# Patient Record
Sex: Male | Born: 1945 | Race: White | Hispanic: No | Smoking: Never smoker
Health system: Southern US, Community
[De-identification: ages and names within clinical notes are randomized; demographics above are authoritative.]

## PROBLEM LIST (undated history)

## (undated) ENCOUNTER — Ambulatory Visit (HOSPITAL_COMMUNITY): Admission: EM | Payer: Medicare Other | Source: Home / Self Care

## (undated) DIAGNOSIS — Z8679 Personal history of other diseases of the circulatory system: Secondary | ICD-10-CM

## (undated) DIAGNOSIS — E785 Hyperlipidemia, unspecified: Secondary | ICD-10-CM

## (undated) DIAGNOSIS — Z87442 Personal history of urinary calculi: Secondary | ICD-10-CM

## (undated) DIAGNOSIS — C801 Malignant (primary) neoplasm, unspecified: Secondary | ICD-10-CM

## (undated) DIAGNOSIS — G4730 Sleep apnea, unspecified: Secondary | ICD-10-CM

## (undated) DIAGNOSIS — G629 Polyneuropathy, unspecified: Secondary | ICD-10-CM

## (undated) DIAGNOSIS — K56609 Unspecified intestinal obstruction, unspecified as to partial versus complete obstruction: Principal | ICD-10-CM

## (undated) HISTORY — PX: EYE SURGERY: SHX253

## (undated) HISTORY — PX: NASAL FRACTURE SURGERY: SHX718

---

## 2001-11-04 ENCOUNTER — Emergency Department (HOSPITAL_COMMUNITY): Admission: EM | Admit: 2001-11-04 | Discharge: 2001-11-04 | Payer: Self-pay | Admitting: Emergency Medicine

## 2004-09-20 ENCOUNTER — Ambulatory Visit: Payer: Self-pay | Admitting: Oncology

## 2004-09-24 ENCOUNTER — Ambulatory Visit: Payer: Self-pay | Admitting: Oncology

## 2004-12-28 ENCOUNTER — Ambulatory Visit: Payer: Self-pay | Admitting: Oncology

## 2005-01-05 ENCOUNTER — Ambulatory Visit: Payer: Self-pay | Admitting: Oncology

## 2005-04-22 ENCOUNTER — Ambulatory Visit: Payer: Self-pay | Admitting: Oncology

## 2005-04-27 ENCOUNTER — Ambulatory Visit: Payer: Self-pay | Admitting: Oncology

## 2005-05-05 ENCOUNTER — Ambulatory Visit: Payer: Self-pay | Admitting: Oncology

## 2005-05-26 ENCOUNTER — Encounter: Payer: Self-pay | Admitting: Oncology

## 2005-06-05 ENCOUNTER — Encounter: Payer: Self-pay | Admitting: Oncology

## 2005-10-21 ENCOUNTER — Ambulatory Visit: Payer: Self-pay | Admitting: Oncology

## 2006-04-06 ENCOUNTER — Ambulatory Visit: Payer: Self-pay | Admitting: Oncology

## 2006-04-07 ENCOUNTER — Ambulatory Visit: Payer: Self-pay | Admitting: Oncology

## 2006-04-28 ENCOUNTER — Ambulatory Visit: Payer: Self-pay | Admitting: Internal Medicine

## 2008-10-05 ENCOUNTER — Ambulatory Visit: Payer: Self-pay | Admitting: Oncology

## 2008-10-16 ENCOUNTER — Ambulatory Visit: Payer: Self-pay | Admitting: Oncology

## 2008-11-05 ENCOUNTER — Ambulatory Visit: Payer: Self-pay | Admitting: Oncology

## 2018-03-07 DIAGNOSIS — N189 Chronic kidney disease, unspecified: Secondary | ICD-10-CM

## 2018-03-07 HISTORY — DX: Chronic kidney disease, unspecified: N18.9

## 2018-06-06 DIAGNOSIS — K56609 Unspecified intestinal obstruction, unspecified as to partial versus complete obstruction: Secondary | ICD-10-CM

## 2018-06-06 HISTORY — PX: SMALL BOWEL EMBOLIZATION: SHX2414

## 2018-06-06 HISTORY — DX: Unspecified intestinal obstruction, unspecified as to partial versus complete obstruction: K56.609

## 2018-06-29 ENCOUNTER — Emergency Department (HOSPITAL_COMMUNITY): Payer: Medicare Other

## 2018-06-29 ENCOUNTER — Encounter (HOSPITAL_COMMUNITY): Payer: Self-pay | Admitting: Student

## 2018-06-29 ENCOUNTER — Inpatient Hospital Stay (HOSPITAL_COMMUNITY): Payer: Medicare Other

## 2018-06-29 ENCOUNTER — Other Ambulatory Visit: Payer: Self-pay

## 2018-06-29 ENCOUNTER — Inpatient Hospital Stay (HOSPITAL_COMMUNITY)
Admission: EM | Admit: 2018-06-29 | Discharge: 2018-07-13 | DRG: 821 | Disposition: A | Discharge status: Skilled Nursing Facility | Payer: Medicare Other | Attending: Internal Medicine | Admitting: Internal Medicine

## 2018-06-29 DIAGNOSIS — E878 Other disorders of electrolyte and fluid balance, not elsewhere classified: Secondary | ICD-10-CM | POA: Diagnosis present

## 2018-06-29 DIAGNOSIS — Z0189 Encounter for other specified special examinations: Secondary | ICD-10-CM

## 2018-06-29 DIAGNOSIS — Z8679 Personal history of other diseases of the circulatory system: Secondary | ICD-10-CM | POA: Diagnosis not present

## 2018-06-29 DIAGNOSIS — Z7902 Long term (current) use of antithrombotics/antiplatelets: Secondary | ICD-10-CM

## 2018-06-29 DIAGNOSIS — C8333 Diffuse large B-cell lymphoma, intra-abdominal lymph nodes: Principal | ICD-10-CM | POA: Diagnosis present

## 2018-06-29 DIAGNOSIS — Z807 Family history of other malignant neoplasms of lymphoid, hematopoietic and related tissues: Secondary | ICD-10-CM

## 2018-06-29 DIAGNOSIS — N179 Acute kidney failure, unspecified: Secondary | ICD-10-CM | POA: Diagnosis present

## 2018-06-29 DIAGNOSIS — I10 Essential (primary) hypertension: Secondary | ICD-10-CM | POA: Diagnosis not present

## 2018-06-29 DIAGNOSIS — Z923 Personal history of irradiation: Secondary | ICD-10-CM

## 2018-06-29 DIAGNOSIS — L989 Disorder of the skin and subcutaneous tissue, unspecified: Secondary | ICD-10-CM | POA: Diagnosis not present

## 2018-06-29 DIAGNOSIS — T402X5A Adverse effect of other opioids, initial encounter: Secondary | ICD-10-CM | POA: Diagnosis not present

## 2018-06-29 DIAGNOSIS — E871 Hypo-osmolality and hyponatremia: Secondary | ICD-10-CM | POA: Diagnosis present

## 2018-06-29 DIAGNOSIS — R112 Nausea with vomiting, unspecified: Secondary | ICD-10-CM | POA: Diagnosis present

## 2018-06-29 DIAGNOSIS — E785 Hyperlipidemia, unspecified: Secondary | ICD-10-CM | POA: Diagnosis present

## 2018-06-29 DIAGNOSIS — I447 Left bundle-branch block, unspecified: Secondary | ICD-10-CM | POA: Diagnosis present

## 2018-06-29 DIAGNOSIS — I952 Hypotension due to drugs: Secondary | ICD-10-CM | POA: Diagnosis not present

## 2018-06-29 DIAGNOSIS — R197 Diarrhea, unspecified: Secondary | ICD-10-CM | POA: Diagnosis not present

## 2018-06-29 DIAGNOSIS — Z79899 Other long term (current) drug therapy: Secondary | ICD-10-CM | POA: Diagnosis not present

## 2018-06-29 DIAGNOSIS — N189 Chronic kidney disease, unspecified: Secondary | ICD-10-CM | POA: Diagnosis present

## 2018-06-29 DIAGNOSIS — E781 Pure hyperglyceridemia: Secondary | ICD-10-CM | POA: Diagnosis not present

## 2018-06-29 DIAGNOSIS — D62 Acute posthemorrhagic anemia: Secondary | ICD-10-CM | POA: Diagnosis not present

## 2018-06-29 DIAGNOSIS — I129 Hypertensive chronic kidney disease with stage 1 through stage 4 chronic kidney disease, or unspecified chronic kidney disease: Secondary | ICD-10-CM | POA: Diagnosis present

## 2018-06-29 DIAGNOSIS — Z9221 Personal history of antineoplastic chemotherapy: Secondary | ICD-10-CM

## 2018-06-29 DIAGNOSIS — E861 Hypovolemia: Secondary | ICD-10-CM | POA: Diagnosis present

## 2018-06-29 DIAGNOSIS — Y92230 Patient room in hospital as the place of occurrence of the external cause: Secondary | ICD-10-CM | POA: Diagnosis not present

## 2018-06-29 DIAGNOSIS — E876 Hypokalemia: Secondary | ICD-10-CM | POA: Diagnosis present

## 2018-06-29 DIAGNOSIS — K668 Other specified disorders of peritoneum: Secondary | ICD-10-CM | POA: Diagnosis present

## 2018-06-29 DIAGNOSIS — K56609 Unspecified intestinal obstruction, unspecified as to partial versus complete obstruction: Secondary | ICD-10-CM | POA: Diagnosis present

## 2018-06-29 DIAGNOSIS — K567 Ileus, unspecified: Secondary | ICD-10-CM | POA: Diagnosis not present

## 2018-06-29 DIAGNOSIS — R1900 Intra-abdominal and pelvic swelling, mass and lump, unspecified site: Secondary | ICD-10-CM | POA: Diagnosis not present

## 2018-06-29 DIAGNOSIS — C8598 Non-Hodgkin lymphoma, unspecified, lymph nodes of multiple sites: Secondary | ICD-10-CM | POA: Diagnosis not present

## 2018-06-29 DIAGNOSIS — R740 Nonspecific elevation of levels of transaminase and lactic acid dehydrogenase [LDH]: Secondary | ICD-10-CM | POA: Diagnosis not present

## 2018-06-29 DIAGNOSIS — L98411 Non-pressure chronic ulcer of buttock limited to breakdown of skin: Secondary | ICD-10-CM | POA: Diagnosis not present

## 2018-06-29 DIAGNOSIS — K660 Peritoneal adhesions (postprocedural) (postinfection): Secondary | ICD-10-CM | POA: Diagnosis present

## 2018-06-29 DIAGNOSIS — D649 Anemia, unspecified: Secondary | ICD-10-CM | POA: Diagnosis not present

## 2018-06-29 DIAGNOSIS — R5381 Other malaise: Secondary | ICD-10-CM | POA: Diagnosis not present

## 2018-06-29 DIAGNOSIS — R1907 Generalized intra-abdominal and pelvic swelling, mass and lump: Secondary | ICD-10-CM | POA: Diagnosis not present

## 2018-06-29 DIAGNOSIS — C8330 Diffuse large B-cell lymphoma, unspecified site: Secondary | ICD-10-CM | POA: Diagnosis not present

## 2018-06-29 DIAGNOSIS — L8990 Pressure ulcer of unspecified site, unspecified stage: Secondary | ICD-10-CM

## 2018-06-29 HISTORY — DX: Unspecified intestinal obstruction, unspecified as to partial versus complete obstruction: K56.609

## 2018-06-29 HISTORY — DX: Malignant (primary) neoplasm, unspecified: C80.1

## 2018-06-29 HISTORY — DX: Personal history of other diseases of the circulatory system: Z86.79

## 2018-06-29 LAB — COMPREHENSIVE METABOLIC PANEL
ALT: 83 U/L — ABNORMAL HIGH (ref 0–44)
AST: 51 U/L — ABNORMAL HIGH (ref 15–41)
Albumin: 3.3 g/dL — ABNORMAL LOW (ref 3.5–5.0)
Alkaline Phosphatase: 72 U/L (ref 38–126)
Anion gap: 17 — ABNORMAL HIGH (ref 5–15)
BUN: 31 mg/dL — ABNORMAL HIGH (ref 8–23)
CO2: 22 mmol/L (ref 22–32)
Calcium: 9.4 mg/dL (ref 8.9–10.3)
Chloride: 91 mmol/L — ABNORMAL LOW (ref 98–111)
Creatinine, Ser: 1.78 mg/dL — ABNORMAL HIGH (ref 0.61–1.24)
GFR calc Af Amer: 44 mL/min — ABNORMAL LOW (ref >60)
GFR calc non Af Amer: 38 mL/min — ABNORMAL LOW (ref >60)
Glucose, Bld: 112 mg/dL — ABNORMAL HIGH (ref 70–99)
Potassium: 3.2 mmol/L — ABNORMAL LOW (ref 3.5–5.1)
Sodium: 130 mmol/L — ABNORMAL LOW (ref 135–145)
Total Bilirubin: 0.8 mg/dL (ref 0.3–1.2)
Total Protein: 6.1 g/dL — ABNORMAL LOW (ref 6.5–8.1)

## 2018-06-29 LAB — CBC WITH DIFFERENTIAL/PLATELET
Band Neutrophils: 0 %
Basophils Absolute: 0 10*3/uL (ref 0.0–0.1)
Basophils Relative: 0 %
Blasts: 0 %
Eosinophils Absolute: 0.1 10*3/uL (ref 0.0–0.5)
Eosinophils Relative: 1 %
HCT: 45.6 % (ref 39.0–52.0)
Hemoglobin: 15.5 g/dL (ref 13.0–17.0)
Lymphocytes Relative: 9 %
Lymphs Abs: 1.1 10*3/uL (ref 0.7–4.0)
MCH: 28.6 pg (ref 26.0–34.0)
MCHC: 34 g/dL (ref 30.0–36.0)
MCV: 84.1 fL (ref 80.0–100.0)
Metamyelocytes Relative: 0 %
Monocytes Absolute: 1.3 10*3/uL — ABNORMAL HIGH (ref 0.1–1.0)
Monocytes Relative: 11 %
Myelocytes: 0 %
Neutro Abs: 9.5 10*3/uL — ABNORMAL HIGH (ref 1.7–7.7)
Neutrophils Relative %: 79 %
Other: 0 %
Platelets: 231 10*3/uL (ref 150–400)
Promyelocytes Relative: 0 %
RBC: 5.42 MIL/uL (ref 4.22–5.81)
RDW: 13.1 % (ref 11.5–15.5)
WBC: 12 10*3/uL — ABNORMAL HIGH (ref 4.0–10.5)
nRBC: 0 % (ref 0.0–0.2)
nRBC: 0 /100 WBC

## 2018-06-29 LAB — URINALYSIS, ROUTINE W REFLEX MICROSCOPIC
Bilirubin Urine: NEGATIVE
Glucose, UA: NEGATIVE mg/dL
Hgb urine dipstick: NEGATIVE
Ketones, ur: 20 mg/dL — AB
Leukocytes,Ua: NEGATIVE
Nitrite: NEGATIVE
Protein, ur: NEGATIVE mg/dL
Specific Gravity, Urine: >1.046 — ABNORMAL HIGH (ref 1.005–1.030)
pH: 5 (ref 5.0–8.0)

## 2018-06-29 LAB — MAGNESIUM: Magnesium: 1.8 mg/dL (ref 1.7–2.4)

## 2018-06-29 LAB — LIPASE, BLOOD: Lipase: 54 U/L — ABNORMAL HIGH (ref 11–51)

## 2018-06-29 MED ORDER — HEPARIN SODIUM (PORCINE) 5000 UNIT/ML IJ SOLN
5000.0000 [IU] | Freq: Three times a day (TID) | INTRAMUSCULAR | Status: DC
  Administered 2018-06-29 – 2018-07-03 (×10): 5000 [IU] via SUBCUTANEOUS
  Filled 2018-06-29 (×12): qty 1

## 2018-06-29 MED ORDER — ACETAMINOPHEN 325 MG PO TABS: 650.0000 mg | ORAL_TABLET | Freq: Four times a day (QID) | ORAL | Status: DC | PRN

## 2018-06-29 MED ORDER — SODIUM CHLORIDE 0.9 % IV SOLN
INTRAVENOUS | Status: DC
  Administered 2018-06-29 – 2018-07-02 (×9): via INTRAVENOUS

## 2018-06-29 MED ORDER — SODIUM CHLORIDE 0.9 % IV SOLN: INTRAVENOUS | Status: DC

## 2018-06-29 MED ORDER — ACETAMINOPHEN 650 MG RE SUPP: 650.0000 mg | Freq: Four times a day (QID) | RECTAL | Status: DC | PRN

## 2018-06-29 MED ORDER — IOPAMIDOL (ISOVUE-300) INJECTION 61%
80.0000 mL | Freq: Once | INTRAVENOUS | Status: AC | PRN
  Administered 2018-06-29: 15:00:00 80 mL via INTRAVENOUS

## 2018-06-29 MED ORDER — ONDANSETRON HCL 4 MG/2ML IJ SOLN
4.0000 mg | Freq: Once | INTRAMUSCULAR | Status: AC
  Administered 2018-06-29: 4 mg via INTRAVENOUS
  Filled 2018-06-29: qty 2

## 2018-06-29 MED ORDER — SODIUM CHLORIDE 0.9 % IV BOLUS
1000.0000 mL | Freq: Once | INTRAVENOUS | Status: AC
  Administered 2018-06-29: 13:00:00 1000 mL via INTRAVENOUS

## 2018-06-29 MED ORDER — ONDANSETRON HCL 4 MG/2ML IJ SOLN
4.0000 mg | Freq: Once | INTRAMUSCULAR | Status: AC
  Administered 2018-06-29: 22:00:00 4 mg via INTRAVENOUS
  Filled 2018-06-29: qty 2

## 2018-06-29 MED ORDER — SODIUM CHLORIDE 0.9% FLUSH
3.0000 mL | Freq: Two times a day (BID) | INTRAVENOUS | Status: DC
  Administered 2018-07-01 – 2018-07-13 (×9): 3 mL via INTRAVENOUS

## 2018-06-29 MED ORDER — POTASSIUM CHLORIDE 10 MEQ/100ML IV SOLN
10.0000 meq | INTRAVENOUS | Status: AC
  Administered 2018-06-29 (×2): 10 meq via INTRAVENOUS
  Filled 2018-06-29 (×2): qty 100

## 2018-06-29 MED ORDER — DIATRIZOATE MEGLUMINE & SODIUM 66-10 % PO SOLN
90.0000 mL | Freq: Once | ORAL | Status: AC
  Administered 2018-06-29: 90 mL via NASOGASTRIC
  Filled 2018-06-29: qty 90

## 2018-06-29 MED ORDER — SODIUM CHLORIDE 0.9 % IV BOLUS
500.0000 mL | Freq: Once | INTRAVENOUS | Status: AC
  Administered 2018-06-29: 500 mL via INTRAVENOUS

## 2018-06-29 MED ORDER — SODIUM CHLORIDE 0.9% FLUSH: 3.0000 mL | INTRAVENOUS | Status: DC | PRN

## 2018-06-29 NOTE — ED Notes (Addendum)
ED TO INPATIENT HANDOFF REPORT  ED Nurse Name and Phone #: Kathlee Nations [phone]  S Name/Age/Gender Jack Fuentes 73 y.o. male Room/Bed: 021C/021C  Code Status   Code Status: Full Code  Home/SNF/Other Home Patient oriented to: self, place, time and situation Is this baseline? 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 Not currently being treated for the cancer. Pt reports he has not had a bowel movement in about a week. Endorces passing small amounts of gas, nausea and vomiting. Pt denies pain.    Allergies Not on File  Level of Care/Admitting Diagnosis ED Disposition    ED Disposition Condition [facility] Area: [place] [[id number]]  Level of Care: Med-Surg [16]  Covid Evaluation: N/A  Diagnosis: SBO (small bowel obstruction) [facility]) [[id number]]  Admitting Physician: Aline Brochure  Attending Physician: [name] [phone]  Estimated length of stay: past midnight tomorrow  Certification:: I certify this patient will need inpatient services for at least 2 midnights  PT Class (Do Not Modify): Inpatient [101]  PT Acc Code (Do Not Modify): Private [1]       B Medical/Surgery History Past Medical History:  Diagnosis Date  . Cancer [facility])    History reviewed. No pertinent surgical history.   A IV Location/Drains/Wounds Patient Lines/Drains/Airways Status   Active Line/Drains/Airways    Name:   Placement date:   Placement time:   Site:   Days:   Peripheral IV 06/29/18 Left Wrist   06/29/18    1225    Wrist   less than 1   NG/OG Tube Nasogastric 14 Fr. Right nare Aucultation   06/29/18    1557    Right nare   less than 1          Intake/Output Last 24 hours  Intake/Output Summary (Last 24 hours) at 06/29/2018 1642 Last data filed at 06/29/2018 1620 Gross per 24 hour  Intake 1000 ml  Output 800 ml  Net 200 ml     Labs/Imaging Results for orders placed or performed during the hospital encounter of 06/29/18 (from the past 48 hour(s))  CBC with Differential     Status: Abnormal   Collection Time: 06/29/18 12:21 PM  Result Value Ref Range   WBC 12.0 (H) 4.0 - 10.5 K/uL   RBC 5.42 4.22 - 5.81 MIL/uL   Hemoglobin 15.5 13.0 - 17.0 g/dL   HCT 45.6 39.0 - 52.0 %   MCV 84.1 80.0 - 100.0 fL   MCH 28.6 26.0 - 34.0 pg   MCHC 34.0 30.0 - 36.0 g/dL   RDW 13.1 11.5 - 15.5 %   Platelets 231 150 - 400 K/uL   nRBC 0.0 0.0 - 0.2 %   Neutrophils Relative % 79 %   Lymphocytes Relative 9 %   Monocytes Relative 11 %   Eosinophils Relative 1 %   Basophils Relative 0 %   Band Neutrophils 0 %   Metamyelocytes Relative 0 %   Myelocytes 0 %   Promyelocytes Relative 0 %   Blasts 0 %   nRBC 0 0 /100 WBC   Other 0 %   Neutro Abs 9.5 (H) 1.7 - 7.7 K/uL   Lymphs Abs 1.1 0.7 - 4.0 K/uL   Monocytes Absolute 1.3 (H) 0.1 - 1.0 K/uL   Eosinophils Absolute 0.1 0.0 - 0.5 K/uL   Basophils Absolute 0.0 0.0 -  0.1 K/uL    Comment: Performed at [facility], [place] [address] [place], [place] [address]  Comprehensive metabolic panel     Status: Abnormal   Collection Time: 06/29/18 12:21 PM  Result Value Ref Range   Sodium 130 (L) 135 - 145 mmol/L   Potassium 3.2 (L) 3.5 - 5.1 mmol/L   Chloride 91 (L) 98 - 111 mmol/L   CO2 22 22 - 32 mmol/L   Glucose, Bld 112 (H) 70 - 99 mg/dL   BUN 31 (H) 8 - 23 mg/dL   Creatinine, Ser 1.78 (H) 0.61 - 1.24 mg/dL   Calcium 9.4 8.9 - 10.3 mg/dL   Total Protein 6.1 (L) 6.5 - 8.1 g/dL   Albumin 3.3 (L) 3.5 - 5.0 g/dL   AST 51 (H) 15 - 41 U/L   ALT 83 (H) 0 - 44 U/L   Alkaline Phosphatase 72 38 - 126 U/L   Total Bilirubin 0.8 0.3 - 1.2 mg/dL   GFR calc non Af Amer 38 (L) >60 mL/min   GFR calc Af Amer 44 (L) >60 mL/min   Anion gap 17 (H) 5 - 15    Comment: Performed at [facility], [place] [address] [place], [place] [address]  Lipase, blood     Status: Abnormal   Collection  Time: 06/29/18 12:21 PM  Result Value Ref Range   Lipase 54 (H) 11 - 51 U/L    Comment: Performed at [place] [address] [place], [place] [address]   Ct Abdomen Pelvis W Contrast  Result Date: 06/29/2018 CLINICAL DATA:  73 year old with nausea and vomiting. History of lymphoma. EXAM: CT ABDOMEN AND PELVIS WITH CONTRAST TECHNIQUE: Multidetector CT imaging of the abdomen and pelvis was performed using the standard protocol following bolus administration of intravenous contrast. CONTRAST:  18mL ISOVUE-300 IOPAMIDOL (ISOVUE-300) INJECTION 61% COMPARISON:  PET-CT 04/28/2006 FINDINGS: Lower chest: Lung bases are clear. Hepatobiliary: Normal appearance of the liver, gallbladder and portal venous system. No biliary dilatation. Pancreas: Unremarkable. No pancreatic ductal dilatation or surrounding inflammatory changes. Spleen: Normal in size without focal abnormality. Adrenals/Urinary Tract: Normal adrenal glands. Chronic atrophy of the left kidney. Chronic dilatation of the left renal pelvis and left renal calices. Small hypodensities in right kidney are too small to definitively characterize. Negative for right hydronephrosis. Urinary bladder is unremarkable. Stomach/Bowel: Stomach and duodenum are distended. Duodenum measures up to 5.5 cm in diameter. Markedly dilated proximal jejunum measuring up to 6.5 cm on sequence 3, image 64. Small bowel loops in the central abdomen have mild wall thickening with mucosal enhancement. The distal small bowel and terminal ileum are decompressed. Multiple small bowel loops are encircling large clusters of central mesenteric calcifications. Findings are compatible with a high-grade small bowel obstruction. The colon is decompressed. There is a retrocecal appendix without inflammation. Vascular/Lymphatic: Atherosclerotic calcifications in the aorta and iliac arteries without aneurysm. Soft tissue thickening along the left side of the aorta at the level of the  atrophic kidney is probably associated with the atrophic kidney and similar to the prior examination. Increased left periaortic calcifications. Again noted are extensive central mesenteric calcifications associated with small mesenteric lymph nodes. There was a similar finding on the study from 2008 although the calcifications may have slightly progressed. Mesenteric calcifications are poorly defined but roughly measures 4.9 x 4.1 cm on sequence 3, image 43. Reproductive: Prostate enlargement measuring 6.9 cm in the short axis. Other: Negative for free fluid.  Negative for free air.  Musculoskeletal: No acute bone abnormality. IMPRESSION: 1. High-grade small bowel obstruction. Massive distension of the stomach, duodenum and proximal small bowel. Transition point is associated with the large mesenteric calcifications in the mid abdomen. Distal small bowel is decompressed. Mild small bowel wall thickening centered around the mesenteric calcifications. 2. Massive central mesenteric calcifications that have minimally changed since 2008. Based on the history of lymphoma, findings are most compatible with treated disease. No evidence for lymphoma recurrence. 3. Chronic atrophy of the left kidney with dilatation of the left renal collecting system, no significant change since 2008. 4.  Aortic Atherosclerosis (ICD10-I70.0). 5. Prostate enlargement. Electronically Signed   By: Markus Daft M.D.   On: 06/29/2018 15:24    Pending Labs Unresulted Labs (From admission, onward)    Start     Ordered   06/30/18 0500  CBC  Tomorrow morning,   R     06/29/18 1632   06/30/18 0500  Comprehensive metabolic panel  Tomorrow morning,   R     06/29/18 1632   06/29/18 1630  Magnesium  Add-on,   R     06/29/18 1632   06/29/18 1628  CBC  (heparin)  Once,   R    Comments:  Baseline for heparin therapy IF NOT ALREADY DRAWN.  Notify MD if PLT < 100 K.    06/29/18 1632   06/29/18 1628  Creatinine, serum  (heparin)  Once,   R     Comments:  Baseline for heparin therapy IF NOT ALREADY DRAWN.    06/29/18 1632   06/29/18 1627  HIV antibody (Routine Testing)  Once,   R     06/29/18 1632   06/29/18 1207  Urinalysis, Routine w reflex microscopic  ONCE - STAT,   STAT     06/29/18 1214          Vitals/Pain Today's Vitals   06/29/18 1530 06/29/18 1541 06/29/18 1600 06/29/18 1630  BP: 111/83 111/83 105/82 101/81  Pulse: (!) 102 (!) 101  (!) 111  Resp: 14 18 15 16   Temp:      TempSrc:      SpO2: 98% 98% 100% 100%  Weight:      Height:      PainSc:        Isolation Precautions No active isolations  Medications Medications  heparin injection 5,000 Units (has no administration in time range)  potassium chloride 10 mEq in 100 mL IVPB (has no administration in time range)  sodium chloride flush (NS) 0.9 % injection 3 mL (has no administration in time range)  sodium chloride flush (NS) 0.9 % injection 3 mL (has no administration in time range)  acetaminophen (TYLENOL) tablet 650 mg (has no administration in time range)    Or  acetaminophen (TYLENOL) suppository 650 mg (has no administration in time range)  sodium chloride 0.9 % bolus 500 mL (has no administration in time range)    Followed by  0.9 %  sodium chloride infusion (has no administration in time range)  sodium chloride 0.9 % bolus 1,000 mL (0 mLs Intravenous Stopped 06/29/18 1341)  iopamidol (ISOVUE-300) 61 % injection 80 mL (80 mLs Intravenous Contrast Given 06/29/18 1446)  ondansetron (ZOFRAN) injection 4 mg (4 mg Intravenous Given 06/29/18 1603)    Mobility walks Low fall risk   Focused Assessments GI   R Recommendations: See Admitting Provider Note  Report given to: [place] RN  Additional Notes: NG tube

## 2018-06-29 NOTE — ED Notes (Signed)
Nurse Navigator communication with patient, he declines family contact. Reports he does not have anyone. RN at bedside currently.

## 2018-06-29 NOTE — Discharge Summary (Signed)
Name: Jack Fuentes MRN: [id number] DOB: 1945/09/04 73 y.o. PCP: Mickel Duhamel  Date of Admission: 06/29/2018 11:33 AM Date of Discharge:  07/13/18 Attending Physician: [name], MD  Discharge Diagnosis: 1. Small bowel obstruction s/p ex lap, lysis adhesion, small bowel resection with resection of calcified mesenteric mass  2. Diffuse large B cell lymphoma  3. Hypertension  4. Acute Kidney Injury  Discharge Medications: Allergies as of 07/13/2018   No Known Allergies     Medication List    STOP taking these medications   valsartan 80 MG tablet Commonly known as:  DIOVAN     TAKE these medications   acetaminophen 325 MG tablet Commonly known as:  TYLENOL Take 2 tablets (650 mg total) by mouth every 6 (six) hours.   clopidogrel 75 MG tablet Commonly known as:  PLAVIX Take 75 mg by mouth daily.   feeding supplement (ENSURE ENLIVE) Liqd Take 237 mLs by mouth 2 (two) times daily between meals.   gabapentin 600 MG tablet Commonly known as:  NEURONTIN Take 600 mg by mouth 2 (two) times daily.   methocarbamol 500 MG tablet Commonly known as:  ROBAXIN Take 1 tablet (500 mg total) by mouth every 8 (eight) hours as needed for muscle spasms.   multivitamin with minerals Tabs tablet Take 1 tablet by mouth daily. Start taking on:  Jul 14, 2018   psyllium 95 % Pack Commonly known as:  HYDROCIL/METAMUCIL Take 1 packet by mouth daily. Start taking on:  Jul 14, 2018   rosuvastatin 10 MG tablet Commonly known as:  CRESTOR Take 10 mg by mouth daily.   saccharomyces boulardii 250 MG capsule Commonly known as:  FLORASTOR Take 1 capsule (250 mg total) by mouth 2 (two) times daily.   Turmeric 500 MG Caps Take 500 mg by mouth 2 (two) times daily.       Disposition and follow-up:   Jack Fuentes was discharged from [facility] in Stable condition.  At the hospital follow up visit please address:  1. Small bowel obstruction:  Please ensure adequate pain control and normal bowel function. Evaluation nutritional status. Follow-up any additional general surgery recommendations. Staples to be removed 5/11. Discharged with JP drain still in place. Please document JP drain output. Surgical pathology from resected abdominal mass revealed Diffuse large B cell lymphoma  2. Diffuse large B cell lymphoma: Seen by Dr. Burr Medico inpatient. Has outpt f/u scheduled May 13th at 11am with Dr. Irene Limbo. PICC line was left in place at discharge per oncology's request. No medications need to be administered via PICC right now. It was left in place for chemotherapy set to start at a later date. Please maintain a clean dressing/bandage over the PICC line in order to decrease infection risk  3. HTN: home valsartan was held at discharge for Bps 108/79. Resume if appropriate   4. Acute Kidney Injury: resolved with IVFs  5. Anemia: Hgb 14.5 at baseline but dropped to 10 after surgery and then slowly dropped to 8.4 and remained stable around 8 at time of discharge. Likely 2/2 acute blood loss after surgery. However, B12 and iron also low end of normal (B12 285, ferritin 76, iron 11, TIBC 196, Sat ratio 6). Recommended IV iron transfusion and IM B12 but he declined. Please repeat CBC and consider iron and b12 supplementation. Hgb at discharge was 8.4   2.  Labs / imaging needed at time of follow-up: CBC in one week   3.  Pending labs/ test needing follow-up: none   Follow-up Appointments:  Contact information for follow-up providers    Autumn Messing III, MD. Schedule an appointment as soon as possible for a visit on 07/16/2018.   Specialty:  General Surgery Why:  for staple removal  Contact information: [place] [address] [phone]        [name], [place]. Schedule an appointment as soon as possible for a visit on 07/19/2018.   Specialty:  Physician Assistant Contact information: [address] [place] [address] [phone]            Contact information for after-discharge care    [facility] .   Service:  Skilled Nursing Contact information: [address] [place] Hospital Course by problem list: 1. 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 He was found to have high-grade small bowel obstruction on CT abdomen and pelvis thought to be secondary to large mesenteric calcification. He reported that he usually experiences episodes of nausea and vomiting about 3-4 times a year but they are typically mild. On arrival, General Surgery was consulted. An NG tube was placed, and he was made NPO. After minimal improvement with conservative therapy, he was taken to the OR on 07/03/18 for ex-lap with lysis of adhesions, small bowel resection with resection of calcified mesenteric mass. Tolerated surgery well without complications. Pathology of the resected mass revealed diffuse large B cell lymphoma. Oncology was consulted inpatient and they discussed possible treatments and arranged outpatient follow up. They requested that the PICC line be left in place for chemotherapy administration. He progressed through postop recovery and was stable for discharge with general surgery follow-up. Discharged to SNF per patient request. He lives alone and is worried he cannot care for himself while having frequent diarrhea.   2. Hypertension: Home Valsartan was held on admission. Blood pressure remained stable throughout admission but were on the low end of normal (110/70s) so valsartan was held at discharge.   3. Acute kidney injury/prerenal azotemia:  BMP on arrival showed sCr 1.7 with elevated BUN of 31. 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 He received IVF resuscitation with normalization in creatinine. Creatinine at discharge was 0.80   Discharge Vitals:   BP 108/79 (BP Location: Left Arm)   Pulse 87   Temp 97.7 F (36.5 C) (Oral)   Resp 18   Ht '5\' 10"'  (1.778 m)   Wt 72.5 kg   SpO2 98%   BMI 22.93 kg/m   Pertinent Labs, Studies, and Procedures:  EXAM: CT ABDOMEN AND PELVIS WITH CONTRAST  TECHNIQUE: Multidetector CT imaging of the abdomen and pelvis was performed using the standard protocol following bolus administration of intravenous contrast.  CONTRAST:  27m ISOVUE-300 IOPAMIDOL (ISOVUE-300) INJECTION 61%  COMPARISON:  PET-CT 04/28/2006  FINDINGS: Lower chest: Lung bases are clear.  Hepatobiliary: Normal appearance of the liver, gallbladder and portal venous system. No biliary dilatation.  Pancreas: Unremarkable. No pancreatic ductal dilatation or surrounding inflammatory changes.  Spleen: Normal in size without focal abnormality.  Adrenals/Urinary Tract: Normal adrenal glands.  Chronic atrophy of the left kidney. Chronic dilatation of the left renal pelvis and left renal calices. Small hypodensities in right kidney are too small to definitively characterize. Negative for right hydronephrosis. Urinary bladder is unremarkable.  Stomach/Bowel: Stomach and duodenum are distended. Duodenum measures up to 5.5 cm in diameter. Markedly dilated proximal jejunum measuring up to 6.5 cm on sequence 3, image 64. Small bowel loops in the central abdomen have mild wall thickening with mucosal enhancement. The distal small bowel and terminal ileum are decompressed. Multiple small bowel loops are encircling large clusters of central mesenteric calcifications. Findings are compatible with a high-grade small bowel obstruction. The colon is decompressed.  There is a retrocecal appendix without inflammation.  Vascular/Lymphatic: Atherosclerotic calcifications in the aorta and iliac arteries without aneurysm. Soft tissue thickening along the left side of the aorta at the level of the atrophic kidney is probably associated with the atrophic kidney and similar to the prior examination. Increased left periaortic calcifications. Again noted are extensive central mesenteric calcifications associated with small mesenteric lymph nodes. There was a similar finding on the study from 2008 although the calcifications may have slightly progressed. Mesenteric calcifications are poorly defined but roughly measures 4.9 x 4.1 cm on sequence 3, image 43.  Reproductive: Prostate enlargement measuring 6.9 cm in the short axis.  Other: Negative for free fluid.  Negative for free air.  Musculoskeletal: No acute bone abnormality.  IMPRESSION: 1. High-grade small bowel obstruction. Massive distension of the stomach, duodenum and proximal small bowel. Transition point is associated with the large mesenteric calcifications in the mid abdomen. Distal small bowel is decompressed. Mild small bowel wall thickening centered around the mesenteric calcifications. 2. Massive central mesenteric calcifications that have minimally changed since 2008. Based on the history of lymphoma, findings are most compatible with treated disease. No evidence for lymphoma recurrence. 3. Chronic atrophy of the left kidney with dilatation of the left renal collecting system, no significant change since 2008. 4.  Aortic Atherosclerosis (ICD10-I70.0). 5. Prostate enlargement.  Surg Pathology Diagnosis 1. Soft tissue mass, simple excision, Superficial Abdominal Wall - FIBROSIS AND CALCIFICATIONS. 2. Small intestine, resection for tumor, Mesenteric Calcification - DIFFUSE LARGE B-CELL LYMPHOMA, SEE COMMENT. Microscopic Comment 2. Sections reveal a dense lymphoid infiltrate.  While some areas have a mixture of large and small irregular lymphocytes, there are areas with sheets and clusters of predominately large lymphocytes with irregular nuclear contours and occasional prominent nucleoli. Immunohistochemistry reveals the atypical cells are positive for CD20, bcl-6, CD10 (weak), and bcl-2. CD3 and CD5 highlight T-cells. Ki-67 is elevated. EBV in situ hybridization is negative. Overall, the findings are consistent with involvement by a diffuse large B-cell lymphoma.  BMP Latest Ref Rng & Units 07/12/2018 07/10/2018 07/09/2018  Glucose 70 - 99 mg/dL 91 103(H) 121(H)  BUN 8 - 23 mg/dL '10 15 20  ' Creatinine 0.61 - 1.24 mg/dL 0.81 0.88 0.89  Sodium 135 - 145 mmol/L 135 136 136  Potassium 3.5 - 5.1 mmol/L 3.8 4.0 4.1  Chloride 98 - 111 mmol/L 109 104 104  CO2 22 - 32 mmol/L 19(L) 23 25  Calcium 8.9 - 10.3 mg/dL 7.9(L) 8.0(L) 7.8(L)   CBC Latest Ref Rng & Units 07/12/2018 07/10/2018 07/09/2018  WBC 4.0 - 10.5 K/uL 8.1 7.7 8.8  Hemoglobin 13.0 - 17.0 g/dL 8.4(L) 8.3(L) 8.4(L)  Hematocrit 39.0 - 52.0 % 26.9(L) 26.2(L) 26.6(L)  Platelets 150 - 400 K/uL 277 212 180     Discharge Instructions: Discharge Instructions    Diet -  low sodium heart healthy   Complete by:  As directed    Discharge instructions   Complete by:  As directed    Jack Fuentes,  It was a pleasure caring for you and I'm glad you're getting better. I wish you a smooth and fast recovery at rehab!  For your abdominal surgery: - the staples will be removed Monday 07/16/18. The rehab facility will help arrange this appointment  - keep the drain in place for now - you can take tylenol and robaxin as needed for pain and continue to probiotic and fiber supplements for diarrhea  Continue holding your blood pressure medicine (Valsartan) until you follow up with your PCP next week.   Your oncology appointment is 5/13 at 11am with Dr. Irene Limbo   If you have worsening abdominal pain, constipation, nausea, vomiting, or  fevers please come back to the hospital.   Increase activity slowly   Complete by:  As directed       Signed: Isabelle Course, MD 07/13/2018, 1:11 PM   Pager: [phone] IMTS PGY-1

## 2018-06-29 NOTE — ED Notes (Signed)
Patient transported to CT 

## 2018-06-29 NOTE — Progress Notes (Signed)
Pt c/o pain at IV site when K+ was running at 100, K+ rate of infusion decreased to 75 for comfort of pt

## 2018-06-29 NOTE — H&P (Signed)
Date: 06/29/2018               Patient Name:  Jack Fuentes MRN: [id number]  DOB: 08/13/1945 Age / Sex: 73 y.o., male   PCP: [name], PA-C         Medical Service: Internal Medicine Teaching Service         Attending Physician: Dr. Lynnae January    First Contact: Dr. Eileen Stanford Pager: [phone]  Second Contact: Dr. 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  He was in his usual state of health until about a week ago when he realized he was not able to keep anything down.  Initially his symptoms of nausea and vomiting were mild however a week ago the symptoms worsened.  His last episode of emesis was in the ED.  He has had decreased p.o. intake as well as dizziness with upstanding but denies chest pain, shortness of breath, abdominal pain, diaphoresis.  He states that he has had this before however continuously resolved.  ED course: Afebrile, tachycardic with range 90-110s, tachypneic with range 14-20s, BP 100s/80s, SPO2 100% on room air.  CBC showed mild leukocytosis of 12 with neutrophil predominance, BMP revealed sodium 130, K+ 3.2, chloride 91, BUN/creatinine 31/1.7, mildly elevated AST/ALT 51/83, lipase mildly elevated at 51.  Received IV fluid resuscitation with 1 L normal saline, NG tube was placed with total output of 800 cc bilious matter.  Meds:  Current Meds  Medication Sig  . clopidogrel (PLAVIX) 75 MG tablet Take 75 mg by mouth daily.  Marland Kitchen gabapentin (NEURONTIN) 600 MG tablet Take 600  mg by mouth 2 (two) times daily.  . rosuvastatin (CRESTOR) 10 MG tablet Take 10 mg by mouth daily.  . Turmeric 500 MG CAPS Take 500 mg by mouth 2 (two) times daily.  . valsartan (DIOVAN) 80 MG tablet Take 80 mg by mouth daily.     Allergies: Allergies as of 06/29/2018  . (Not on File)   Past Medical History:  Diagnosis Date  . Cancer [facility])     Family History: Cousin with lymphoma  Social History: Originally from [place], previously in [place] however retired.  Occasionally drinks wine/beer but denies tobacco or illicit drug use.  Review of Systems: A complete ROS was negative except as per HPI.   Review of Systems  Constitutional: Negative for chills, fever and malaise/fatigue.  HENT: Positive for sore throat (After NG tube placement).   Eyes: Negative for blurred vision.  Respiratory: Negative for cough and shortness of breath.   Cardiovascular: Negative for chest pain and leg swelling.  Gastrointestinal: Positive for nausea and vomiting. Negative for abdominal pain, constipation and diarrhea.  Genitourinary: Negative.   Musculoskeletal: Negative.   Neurological: Positive for dizziness.  Psychiatric/Behavioral: Negative.     Physical Exam: Blood pressure 101/81, pulse (!) 111, temperature 98 F (36.7 C), temperature source Oral, resp. rate 16, height 5\' 10"  (1.778 m), weight 72.6 kg, SpO2 100 %.  Physical Exam Vitals signs and nursing note reviewed.  Constitutional:      General: Distressed: Moderately  distressed due to NG Tube placement      Appearance: Normal appearance.  HENT:     Head: Normocephalic and atraumatic.     Nose:     Comments: NG Tube in place Eyes:     Conjunctiva/sclera: Conjunctivae normal.  Neck:     Musculoskeletal: Normal range of motion and neck supple.  Cardiovascular:     Rate and Rhythm: Tachycardia present.     Heart sounds: Normal heart sounds. No murmur. No gallop.   Pulmonary:     Effort: Pulmonary effort is normal. No  respiratory distress.     Breath sounds: Normal breath sounds. No wheezing, rhonchi or rales.  Abdominal:     General: Abdomen is flat. There is no distension.     Tenderness: There is no abdominal tenderness. There is no guarding or rebound.     Hernia: No hernia is present.     Comments: Absent bowel sounds  Musculoskeletal:     Right lower leg: No edema.     Left lower leg: No edema.  Neurological:     Mental Status: He is alert.  Psychiatric:        Mood and Affect: Mood normal.        Behavior: Behavior normal.     EKG: pending   CT Abdomen and Pelvis:  IMPRESSION: 1. High-grade small bowel obstruction. Massive distension of the stomach, duodenum and proximal small bowel. Transition point is associated with the large mesenteric calcifications in the mid abdomen. Distal small bowel is decompressed. Mild small bowel wall thickening centered around the mesenteric calcifications. 2. Massive central mesenteric calcifications that have minimally changed since 2008. Based on the history of lymphoma, findings are most compatible with treated disease. No evidence for lymphoma recurrence. 3. Chronic atrophy of the left kidney with dilatation of the left renal collecting system, no significant change since 2008. 4.  Aortic Atherosclerosis (ICD10-I70.0). 5. Prostate enlargement.  Assessment & Plan by Problem: Active Problems:   SBO (small bowel obstruction) [facility])  Jack Fuentes is a 73 year old gentleman with retroperitoneal/mesenteric non-Hodgkin's lymphoma (dx 2000) status post chemo/radiation, HTN, Hypertriglyceridemia, LBBB who present with acute onset nausea and vomiting found to have severe small bowel obstruction on CT abdomen.   #Small bowel obstruction: Acute onset intractable nausea with bilious emesis.  Found to have high-grade small bowel obstruction on CT abdomen and pelvis.  He denies any abdominal surgeries with exception of biopsies in 2010.  Given CT findings of  large mesenteric calcification, this could be a possible etiology for his ongoing small bowel obstruction. -Status post 1.5 L NS bolus -Continue maintenance with NS 150/hour -Please appreciate general surgery recommendation -Keep NG tube in place with intermittent suction - N.p.o. -Follow-up magnesium -Pain regimen: Tylenol prn  #Hypertension: BP stable  -Hold valsartan 80 mg daily  #Acute kidney injury/prerenal azotemia: sCr 1.7 with elevated BUN of 31. His elevated anion gap and hypochloremia due to intractable emesis.  Atrophic left kidney due to complications of non-Hodgkin's lymphoma. - Continue IVF resuscitation   #Hypokalemia: K+ of 3.2 -IV repletion -Follow BMP -Follow-up magnesium  #History of LBBB:  - Follow-up EKG  #Elevated Transaminases: Mildly elevated AST/ALT 51/83. CT scan without evidence of liver parenchymal damage. Most likely due to volume depletion  -Continue to monitor  #Leukocytosis: CBC with WBC of 12 w/ neutrophil predominance. Remains afebrile and denies recent signs or symptoms of infection. He is not on steroid  #Presumed dx of peripheral vertigo:  -On meclizine prn  FEN:  N.p.o., IVF, replace electrolytes as needed VTE ppx: Subcutaneous heparin CODE STATUS: Full code  Dispo: Admit patient to Inpatient with expected length of stay greater than 2 midnights.  Signed: Jean Rosenthal, MD 06/29/2018, 4:58 PM  Pager: [phone] IMTS PGY-1

## 2018-06-29 NOTE — ED Provider Notes (Signed)
[place] [facility] EMERGENCY DEPARTMENT Provider Note   CSN: [id number] Arrival date & time: 06/29/18  1133   History   Chief Complaint Chief Complaint  Patient presents with  . GI Problem    HPI Jack Fuentes is a 73 y.o. male with a history of non-Hodgkin's lymphoma in remission status post radiation/chemotherapy who presents to the emergency department with complaints of GI issues x 1 week.  Patient notes that he has not been able to keep anything down or have a bowel movement in over 1 week.  He notes his last bowel movement was with loose stool.  Since then he has not been able to have a bowel movement, he is passing minimal amounts of gas. He notes that whenever he attempts to eat or drink anything he has subsequent nausea & multiple episodes of emesis, this occurs daily. Has been trying to drink gatorade to maintain some hydration. He is still urinating. No alleviating/aggravating factors other than PO intake attempts. He is not really having any associated abdominal pain.  He feels generally weak.  He has lost weight with this. Denies fever, chills, hematemesis, chest pain, dizziness, or syncope.     HPI  No past medical history on file.  There are no active problems to display for this patient.   History reviewed. No pertinent surgical history.    Home Medications    Prior to Admission medications   Not on File    Family History No family history on file.  Social History Social History   Tobacco Use  . Smoking status: Not on file  Substance Use Topics  . Alcohol use: Not on file  . Drug use: Not on file     Allergies   Patient has no allergy information on record.   Review of Systems Review of Systems  Constitutional: Positive for unexpected weight change. Negative for chills and fever.  Respiratory: Negative for shortness of breath.   Cardiovascular: Negative for chest pain.  Gastrointestinal: Positive for constipation, nausea and  vomiting. Negative for abdominal pain and blood in stool.  Genitourinary: Negative for dysuria.  Neurological: Positive for weakness (generalized). Negative for dizziness and syncope.  All other systems reviewed and are negative.  Physical Exam Updated Vital Signs BP 95/84   Pulse (!) 107   Temp 98 F (36.7 C) (Oral)   Resp (!) 22   Ht 5\' 10"  (1.778 m)   Wt 72.6 kg   SpO2 95%   BMI 22.96 kg/m   Physical Exam Vitals signs and nursing note reviewed.  Constitutional:      General: He is not in acute distress.    Appearance: He is well-developed. He is not toxic-appearing.  HENT:     Head: Normocephalic and atraumatic.     Mouth/Throat:     Mouth: Mucous membranes are dry.  Eyes:     General:        Right eye: No discharge.        Left eye: No discharge.     Conjunctiva/sclera: Conjunctivae normal.  Neck:     Musculoskeletal: Neck supple.  Cardiovascular:     Rate and Rhythm: Regular rhythm. Tachycardia present.  Pulmonary:     Effort: Pulmonary effort is normal. No respiratory distress.     Breath sounds: Normal breath sounds. No wheezing, rhonchi or rales.  Abdominal:     General: There is no distension.     Palpations: Abdomen is soft.     Tenderness: There is  abdominal tenderness (mild epigastric). There is no guarding or rebound.  Skin:    General: Skin is warm and dry.     Findings: No rash.  Neurological:     Mental Status: He is alert.     Comments: Clear speech.   Psychiatric:        Behavior: Behavior normal.    ED Treatments / Results  Labs (all labs ordered are listed, but only abnormal results are displayed) Labs Reviewed  CBC WITH DIFFERENTIAL/PLATELET - Abnormal; Notable for the following components:      Result Value   WBC 12.0 (*)    Neutro Abs 9.5 (*)    Monocytes Absolute 1.3 (*)    All other components within normal limits  COMPREHENSIVE METABOLIC PANEL - Abnormal; Notable for the following components:   Sodium 130 (*)    Potassium 3.2  (*)    Chloride 91 (*)    Glucose, Bld 112 (*)    BUN 31 (*)    Creatinine, Ser 1.78 (*)    Total Protein 6.1 (*)    Albumin 3.3 (*)    AST 51 (*)    ALT 83 (*)    GFR calc non Af Amer 38 (*)    GFR calc Af Amer 44 (*)    Anion gap 17 (*)    All other components within normal limits  LIPASE, BLOOD - Abnormal; Notable for the following components:   Lipase 54 (*)    All other components within normal limits  URINALYSIS, ROUTINE W REFLEX MICROSCOPIC    EKG None  Radiology Ct Abdomen Pelvis W Contrast  Result Date: 06/29/2018 CLINICAL DATA:  73 year old with nausea and vomiting. History of lymphoma. EXAM: CT ABDOMEN AND PELVIS WITH CONTRAST TECHNIQUE: Multidetector CT imaging of the abdomen and pelvis was performed using the standard protocol following bolus administration of intravenous contrast. CONTRAST:  77mL ISOVUE-300 IOPAMIDOL (ISOVUE-300) INJECTION 61% COMPARISON:  PET-CT 04/28/2006 FINDINGS: Lower chest: Lung bases are clear. Hepatobiliary: Normal appearance of the liver, gallbladder and portal venous system. No biliary dilatation. Pancreas: Unremarkable. No pancreatic ductal dilatation or surrounding inflammatory changes. Spleen: Normal in size without focal abnormality. Adrenals/Urinary Tract: Normal adrenal glands. Chronic atrophy of the left kidney. Chronic dilatation of the left renal pelvis and left renal calices. Small hypodensities in right kidney are too small to definitively characterize. Negative for right hydronephrosis. Urinary bladder is unremarkable. Stomach/Bowel: Stomach and duodenum are distended. Duodenum measures up to 5.5 cm in diameter. Markedly dilated proximal jejunum measuring up to 6.5 cm on sequence 3, image 64. Small bowel loops in the central abdomen have mild wall thickening with mucosal enhancement. The distal small bowel and terminal ileum are decompressed. Multiple small bowel loops are encircling large clusters of central mesenteric calcifications.  Findings are compatible with a high-grade small bowel obstruction. The colon is decompressed. There is a retrocecal appendix without inflammation. Vascular/Lymphatic: Atherosclerotic calcifications in the aorta and iliac arteries without aneurysm. Soft tissue thickening along the left side of the aorta at the level of the atrophic kidney is probably associated with the atrophic kidney and similar to the prior examination. Increased left periaortic calcifications. Again noted are extensive central mesenteric calcifications associated with small mesenteric lymph nodes. There was a similar finding on the study from 2008 although the calcifications may have slightly progressed. Mesenteric calcifications are poorly defined but roughly measures 4.9 x 4.1 cm on sequence 3, image 43. Reproductive: Prostate enlargement measuring 6.9 cm in the short axis. Other: Negative  for free fluid.  Negative for free air. Musculoskeletal: No acute bone abnormality. IMPRESSION: 1. High-grade small bowel obstruction. Massive distension of the stomach, duodenum and proximal small bowel. Transition point is associated with the large mesenteric calcifications in the mid abdomen. Distal small bowel is decompressed. Mild small bowel wall thickening centered around the mesenteric calcifications. 2. Massive central mesenteric calcifications that have minimally changed since 2008. Based on the history of lymphoma, findings are most compatible with treated disease. No evidence for lymphoma recurrence. 3. Chronic atrophy of the left kidney with dilatation of the left renal collecting system, no significant change since 2008. 4.  Aortic Atherosclerosis (ICD10-I70.0). 5. Prostate enlargement. Electronically Signed   By: Markus Daft M.D.   On: 06/29/2018 15:24   Procedures Procedures (including critical care time)  Medications Ordered in ED Medications - No data to display   Initial Impression / Assessment and Plan / ED Course  I have reviewed  the triage vital signs and the nursing notes.  Pertinent labs & imaging results that were available during my care of the patient were reviewed by me and considered in my medical decision making (see chart for details).   Patient presents to the ED with nausea, vomiting, and constipation. Nontoxic appearing. Noted to be tachycardic with dry mucous membranes & mildly tender epigastrium, no peritoneal signs. DDX: obstruction, perforation, mass, ileus. Plan for labs, CT, and hydration.   Work-up reviewed:  CBC: Nonspecific leukocytosis @ 12.0. No anemia.  CMP: Multiple mild electrolyte abnormalities as above. BUN/creatinine elevated. Anion gap is elevated. Albumin is low. Overall consistent w/ dehydration & trouble w/ tolerating PO. Fluids running.  Lipase: minimally elevated UA: Pending.   Discussed w/ radiology Dr. Anselm Pancoast- massive SBO w/ transition associated w/ large mesenteric calcifications. NG tube ordered, consult placed to general surgery.   CT abdomen pelvis: High-grade SBO w/ massive distension of stomach, duodenum, and proximal small bowel.  Transition point is associated with the large mesenteric calcifications in the mid abdomen. Distal small bowel is decompressed. Calcifications noted have minimally changed from 2008- w/ hx of lymphoma findings compatible w/ treated disease. No evidence of lymphoma reoccurrence.   Discussed results & plan with patient, provided opportunity for questions, patient confirmed understanding and is agreeable.   15:37: CONSULT: Discussed case with Theodis Sato with general surgery- recommends NG tube and medicine admit.   15:48: CONSULT: Discussed case with internal medicine teaching service- accepts admission.    This is a shared visit with supervising physician Dr. Sabra Heck who has independently evaluated patient & provided guidance in evaluation/management/disposition, in agreement with care   Final Clinical Impressions(s) / ED Diagnoses   Final  diagnoses:  SBO (small bowel obstruction) [facility])    ED Discharge Orders    None       Amaryllis Dyke, PA-C 06/29/18 1550    Noemi Chapel, MD 06/30/18 [phone]

## 2018-06-29 NOTE — ED Triage Notes (Signed)
Pt was diagnosed in 2000 with nonhongens lymphoma . Not currently being treated for the cancer. Pt reports he has not had a bowel movement in about a week. Endorces passing small amounts of gas, nausea and vomiting. Pt denies pain.

## 2018-06-29 NOTE — Plan of Care (Signed)
  Problem: Education: Goal: Knowledge of General Education information will improve Description Including pain rating scale, medication(s)/side effects and non-pharmacologic comfort measures Outcome: Progressing   Problem: Health Behavior/Discharge Planning: Goal: Ability to manage health-related needs will improve Outcome: Progressing   Problem: Clinical Measurements: Goal: Ability to maintain clinical measurements within normal limits will improve Outcome: Progressing Goal: Will remain free from infection Outcome: Progressing   Problem: Activity: Goal: Risk for activity intolerance will decrease Outcome: Progressing   Problem: Coping: Goal: Level of anxiety will decrease Outcome: Progressing   Problem: Elimination: Goal: Will not experience complications related to urinary retention Outcome: Progressing   Problem: Pain Managment: Goal: General experience of comfort will improve Outcome: Progressing   Problem: Safety: Goal: Ability to remain free from injury will improve Outcome: Progressing   Problem: Skin Integrity: Goal: Risk for impaired skin integrity will decrease Outcome: Progressing   

## 2018-06-29 NOTE — ED Provider Notes (Signed)
Medical screening examination/treatment/procedure(s) were conducted as a shared visit with non-physician practitioner(s) and myself.  I personally evaluated the patient during the encounter.  Clinical Impression:   Final diagnoses:  SBO (small bowel obstruction) (HCC)      Has hx of some type of lymphoma s/p treatment - in remission - but has had progressive weight loss in the last coupe of months and more this week - now having n/v and no BM's in the last week. On exam has tympanitic sounds to percussion - otherwise non tender.  Heart tachycardic and lungs clear - MM dry. Hydrate, labs and CT.   Noemi Chapel, MD 06/30/18 [id number]

## 2018-06-29 NOTE — ED Notes (Signed)
Attempted to collect UA. Pt unable to produce at this time. Urinal at bedside

## 2018-06-29 NOTE — Consult Note (Signed)
[facility] Consult/Admission Note  Jack Fuentes Mar 25, 1945  [id number].    Requesting MD: Kennith Maes  Chief Complaint:  No BM x 1 week, nausea and vomiting  Reason for Consult:SBO   HPI: 73 y.o. male with a history of non-Hodgkin's lymphoma in remission;  s/p radiation/chemotherapy prior therapy in 2000, at [facility].  Dr. Keturah Shavers was his physician at that time.   Patient says he starting getting sick about 12 days ago.  He had some low-grade nausea and bloating.  He has had this before and it is resolved on its own.  This time it became progressively worse and has been severe for the last week.  Patient notes that he has not been able to keep anything down or have a bowel movement in over 1 week.  He notes his last bowel movement was with loose stool. He is not able to eat or drink. He is not having abdominal pain, but feels weak, with ongoing nausea and vomiting.  Work up in the ED:  He is afebrile, tachycardic, SBP  97-111 range. Na 130, K+ 3.2, Creatinine 1.78, lipase 54, ALT 83, AST51.  WBC 12.0 H/H 15.5/45.6. CT scan of the abd/pelvis:High-grade small bowel obstruction. Massive distension of the stomach, duodenum and proximal small bowel. Transition point is associated with the large mesenteric calcifications in the mid abdomen. Distal small bowel is decompressed. Mild small bowel wall thickening centered around the mesenteric calcifications. 2. Massive central mesenteric calcifications that have minimally changed since 2008. Based on the history of lymphoma, findings are most compatible with treated disease. No evidence for lymphoma recurrence. 3. Chronic atrophy of the left kidney with dilatation of the left renal collecting system, no significant change since 2008. We are ask to see.  ED a small NG tube was placed.  They were only able to get about 200 which was mostly irrigation.  The drainage is thick green fluid.  After repeated  irrigations and clearing the tube I got 1 full canister out.  They replaced it but this tube is small is can have to be irrigated frequently or replaced.  Discussed this with medicine. ROS: Review of Systems  Constitutional: Positive for malaise/fatigue. Negative for chills, diaphoresis, fever and weight loss.  HENT: Negative.   Eyes: Negative.   Respiratory: Negative.   Cardiovascular: Negative.   Gastrointestinal: Positive for constipation, nausea and vomiting.       Bloating  Genitourinary: Negative.   Musculoskeletal: Negative.   Skin: Negative.   Neurological: Negative.   Endo/Heme/Allergies: Negative.   Psychiatric/Behavioral: Negative.     History reviewed. No pertinent family history.  Past Medical History:  Diagnosis Date  . Hx Non-Hodgkins Lymphoma with radiation and chemotherapy Hypertension Hyperlipidemia Hx of nerve damage with radiation/chemo Rx - lower extremity pain      History reviewed. No pertinent surgical history.  Social History:  reports previous alcohol use. He reports previous drug use. No history on file for tobacco.  Allergies: Not on File  Prior to Admission medications   Medication Sig Start Date End Date Taking? Authorizing Provider  clopidogrel (PLAVIX) 75 MG tablet Take 75 mg by mouth daily. 04/18/18  Yes [provider]  gabapentin (NEURONTIN) 600 MG tablet Take 600 mg by mouth 2 (two) times daily. 04/18/18  Yes [provider]  rosuvastatin (CRESTOR) 10 MG tablet Take 10 mg by mouth daily. 04/18/18  Yes [provider]  Turmeric 500 MG CAPS Take 500 mg by mouth 2 (two)  times daily.   Yes [provider]  valsartan (DIOVAN) 80 MG tablet Take 80 mg by mouth daily.   Yes [provider]     Blood pressure 106/75, pulse 97, temperature 98 F (36.7 C), temperature source Oral, resp. rate 14, height 5\' 10"  (1.778 m), weight 72.6 kg, SpO2 98 %. Physical Exam: Physical Exam Constitutional:       General: He is not in acute distress.    Appearance: He is normal weight. He is ill-appearing. He is not toxic-appearing or diaphoretic.  HENT:     Head: Normocephalic and atraumatic.     Nose: No congestion or rhinorrhea.     Comments: NG in place small tube and needs to be irrigated frequently    Mouth/Throat:     Pharynx: No oropharyngeal exudate or posterior oropharyngeal erythema.     Comments: Sore with NG in place Eyes:     General: No scleral icterus.       Right eye: No discharge.        Left eye: No discharge.     Conjunctiva/sclera: Conjunctivae normal.     Comments: Pupils are equal  Neck:     Musculoskeletal: Normal range of motion and neck supple. No neck rigidity or muscular tenderness.     Vascular: No carotid bruit.  Cardiovascular:     Rate and Rhythm: Regular rhythm. Tachycardia present.     Pulses: Normal pulses.     Heart sounds: Normal heart sounds. No murmur.  Pulmonary:     Effort: Pulmonary effort is normal. No respiratory distress.     Breath sounds: Normal breath sounds. No stridor. No wheezing, rhonchi or rales.  Chest:     Chest wall: No tenderness.  Abdominal:     General: There is distension (I just got 1200 from the NG so he is not distended right now).     Palpations: Abdomen is soft. There is no mass.     Tenderness: There is no abdominal tenderness. There is no right CVA tenderness, left CVA tenderness, guarding or rebound.     Hernia: No hernia is present.     Comments: BS are hyperactive He has 2 small incisions from prior laparoscopy per pt. One RLQ and one at the umbilicus  Musculoskeletal:        General: No swelling, tenderness, deformity or signs of injury.     Right lower leg: No edema.     Left lower leg: No edema.  Lymphadenopathy:     Cervical: No cervical adenopathy.  Skin:    General: Skin is warm and dry.     Coloration: Skin is not jaundiced or pale.     Findings: No bruising, erythema, lesion or rash.  Neurological:      General: No focal deficit present.     Mental Status: He is alert and oriented to person, place, and time. Mental status is at baseline.     Cranial Nerves: No cranial nerve deficit.  Psychiatric:        Mood and Affect: Mood normal.        Behavior: Behavior normal.        Thought Content: Thought content normal.        Judgment: Judgment normal.     Results for orders placed or performed during the hospital encounter of 06/29/18 (from the past 48 hour(s))  CBC with Differential     Status: Abnormal   Collection Time: 06/29/18 12:21 PM  Result Value Ref Range  WBC 12.0 (H) 4.0 - 10.5 K/uL   RBC 5.42 4.22 - 5.81 MIL/uL   Hemoglobin 15.5 13.0 - 17.0 g/dL   HCT 45.6 39.0 - 52.0 %   MCV 84.1 80.0 - 100.0 fL   MCH 28.6 26.0 - 34.0 pg   MCHC 34.0 30.0 - 36.0 g/dL   RDW 13.1 11.5 - 15.5 %   Platelets 231 150 - 400 K/uL   nRBC 0.0 0.0 - 0.2 %   Neutrophils Relative % 79 %   Lymphocytes Relative 9 %   Monocytes Relative 11 %   Eosinophils Relative 1 %   Basophils Relative 0 %   Band Neutrophils 0 %   Metamyelocytes Relative 0 %   Myelocytes 0 %   Promyelocytes Relative 0 %   Blasts 0 %   nRBC 0 0 /100 WBC   Other 0 %   Neutro Abs 9.5 (H) 1.7 - 7.7 K/uL   Lymphs Abs 1.1 0.7 - 4.0 K/uL   Monocytes Absolute 1.3 (H) 0.1 - 1.0 K/uL   Eosinophils Absolute 0.1 0.0 - 0.5 K/uL   Basophils Absolute 0.0 0.0 - 0.1 K/uL    Comment: Performed at [facility] [address] [place], [place] [address]  Comprehensive metabolic panel     Status: Abnormal   Collection Time: 06/29/18 12:21 PM  Result Value Ref Range   Sodium 130 (L) 135 - 145 mmol/L   Potassium 3.2 (L) 3.5 - 5.1 mmol/L   Chloride 91 (L) 98 - 111 mmol/L   CO2 22 22 - 32 mmol/L   Glucose, Bld 112 (H) 70 - 99 mg/dL   BUN 31 (H) 8 - 23 mg/dL   Creatinine, Ser 1.78 (H) 0.61 - 1.24 mg/dL   Calcium 9.4 8.9 - 10.3 mg/dL   Total Protein 6.1 (L) 6.5 - 8.1 g/dL   Albumin 3.3 (L) 3.5 - 5.0 g/dL   AST 51 (H) 15 - 41 U/L    ALT 83 (H) 0 - 44 U/L   Alkaline Phosphatase 72 38 - 126 U/L   Total Bilirubin 0.8 0.3 - 1.2 mg/dL   GFR calc non Af Amer 38 (L) >60 mL/min   GFR calc Af Amer 44 (L) >60 mL/min   Anion gap 17 (H) 5 - 15    Comment: Performed at [facility], [place] [address], [place], [place] [address]  Lipase, blood     Status: Abnormal   Collection Time: 06/29/18 12:21 PM  Result Value Ref Range   Lipase 54 (H) 11 - 51 U/L    Comment: Performed at [facility] [address] [place], [place] [address]   Ct Abdomen Pelvis W Contrast  Result Date: 06/29/2018 CLINICAL DATA:  73 year old with nausea and vomiting. History of lymphoma. EXAM: CT ABDOMEN AND PELVIS WITH CONTRAST TECHNIQUE: Multidetector CT imaging of the abdomen and pelvis was performed using the standard protocol following bolus administration of intravenous contrast. CONTRAST:  58mL ISOVUE-300 IOPAMIDOL (ISOVUE-300) INJECTION 61% COMPARISON:  PET-CT 04/28/2006 FINDINGS: Lower chest: Lung bases are clear. Hepatobiliary: Normal appearance of the liver, gallbladder and portal venous system. No biliary dilatation. Pancreas: Unremarkable. No pancreatic ductal dilatation or surrounding inflammatory changes. Spleen: Normal in size without focal abnormality. Adrenals/Urinary Tract: Normal adrenal glands. Chronic atrophy of the left kidney. Chronic dilatation of the left renal pelvis and left renal calices. Small hypodensities in right kidney are too small to definitively characterize. Negative for right hydronephrosis. Urinary bladder is unremarkable. Stomach/Bowel:  Stomach and duodenum are distended. Duodenum measures up to 5.5 cm in diameter. Markedly dilated proximal jejunum measuring up to 6.5 cm on sequence 3, image 64. Small bowel loops in the central abdomen have mild wall thickening with mucosal enhancement. The distal small bowel and terminal ileum are decompressed. Multiple small bowel loops are encircling large clusters of central  mesenteric calcifications. Findings are compatible with a high-grade small bowel obstruction. The colon is decompressed. There is a retrocecal appendix without inflammation. Vascular/Lymphatic: Atherosclerotic calcifications in the aorta and iliac arteries without aneurysm. Soft tissue thickening along the left side of the aorta at the level of the atrophic kidney is probably associated with the atrophic kidney and similar to the prior examination. Increased left periaortic calcifications. Again noted are extensive central mesenteric calcifications associated with small mesenteric lymph nodes. There was a similar finding on the study from 2008 although the calcifications may have slightly progressed. Mesenteric calcifications are poorly defined but roughly measures 4.9 x 4.1 cm on sequence 3, image 43. Reproductive: Prostate enlargement measuring 6.9 cm in the short axis. Other: Negative for free fluid.  Negative for free air. Musculoskeletal: No acute bone abnormality. IMPRESSION: 1. High-grade small bowel obstruction. Massive distension of the stomach, duodenum and proximal small bowel. Transition point is associated with the large mesenteric calcifications in the mid abdomen. Distal small bowel is decompressed. Mild small bowel wall thickening centered around the mesenteric calcifications. 2. Massive central mesenteric calcifications that have minimally changed since 2008. Based on the history of lymphoma, findings are most compatible with treated disease. No evidence for lymphoma recurrence. 3. Chronic atrophy of the left kidney with dilatation of the left renal collecting system, no significant change since 2008. 4.  Aortic Atherosclerosis (ICD10-I70.0). 5. Prostate enlargement. Electronically Signed   By: Markus Daft M.D.   On: 06/29/2018 15:24   . sodium chloride     Followed by  . sodium chloride    . 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  He has a small tube so this can need to be irrigated frequently to keep it open and functional.  IV fluids for rehydration.  We need to normalize his electrolytes through fluid replacement.  We will start the small bowel protocol later this evening.  If they continue to have issues with the small NG tube may need to be replaced with a larger NG tube.     Earnstine Regal [facility] 06/29/2018, 3:41 PM Pager: [phone] Consults: [phone]

## 2018-06-30 ENCOUNTER — Inpatient Hospital Stay (HOSPITAL_COMMUNITY): Payer: Medicare Other

## 2018-06-30 DIAGNOSIS — E876 Hypokalemia: Secondary | ICD-10-CM

## 2018-06-30 DIAGNOSIS — N179 Acute kidney failure, unspecified: Secondary | ICD-10-CM

## 2018-06-30 DIAGNOSIS — I10 Essential (primary) hypertension: Secondary | ICD-10-CM

## 2018-06-30 DIAGNOSIS — E781 Pure hyperglyceridemia: Secondary | ICD-10-CM

## 2018-06-30 DIAGNOSIS — R740 Nonspecific elevation of levels of transaminase and lactic acid dehydrogenase [LDH]: Secondary | ICD-10-CM

## 2018-06-30 DIAGNOSIS — C8598 Non-Hodgkin lymphoma, unspecified, lymph nodes of multiple sites: Secondary | ICD-10-CM

## 2018-06-30 DIAGNOSIS — Z923 Personal history of irradiation: Secondary | ICD-10-CM

## 2018-06-30 DIAGNOSIS — Z79899 Other long term (current) drug therapy: Secondary | ICD-10-CM

## 2018-06-30 DIAGNOSIS — Z9221 Personal history of antineoplastic chemotherapy: Secondary | ICD-10-CM

## 2018-06-30 DIAGNOSIS — I447 Left bundle-branch block, unspecified: Secondary | ICD-10-CM

## 2018-06-30 LAB — COMPREHENSIVE METABOLIC PANEL
ALT: 68 U/L — ABNORMAL HIGH (ref 0–44)
AST: 42 U/L — ABNORMAL HIGH (ref 15–41)
Albumin: 3 g/dL — ABNORMAL LOW (ref 3.5–5.0)
Alkaline Phosphatase: 71 U/L (ref 38–126)
Anion gap: 14 (ref 5–15)
BUN: 23 mg/dL (ref 8–23)
CO2: 28 mmol/L (ref 22–32)
Calcium: 8.9 mg/dL (ref 8.9–10.3)
Chloride: 91 mmol/L — ABNORMAL LOW (ref 98–111)
Creatinine, Ser: 1.62 mg/dL — ABNORMAL HIGH (ref 0.61–1.24)
GFR calc Af Amer: 49 mL/min — ABNORMAL LOW (ref >60)
GFR calc non Af Amer: 42 mL/min — ABNORMAL LOW (ref >60)
Glucose, Bld: 87 mg/dL (ref 70–99)
Potassium: 2.9 mmol/L — ABNORMAL LOW (ref 3.5–5.1)
Sodium: 133 mmol/L — ABNORMAL LOW (ref 135–145)
Total Bilirubin: 0.9 mg/dL (ref 0.3–1.2)
Total Protein: 5.7 g/dL — ABNORMAL LOW (ref 6.5–8.1)

## 2018-06-30 LAB — BASIC METABOLIC PANEL
Anion gap: 19 — ABNORMAL HIGH (ref 5–15)
BUN: 22 mg/dL (ref 8–23)
CO2: 18 mmol/L — ABNORMAL LOW (ref 22–32)
Calcium: 8.8 mg/dL — ABNORMAL LOW (ref 8.9–10.3)
Chloride: 97 mmol/L — ABNORMAL LOW (ref 98–111)
Creatinine, Ser: 1.53 mg/dL — ABNORMAL HIGH (ref 0.61–1.24)
GFR calc Af Amer: 53 mL/min — ABNORMAL LOW (ref >60)
GFR calc non Af Amer: 45 mL/min — ABNORMAL LOW (ref >60)
Glucose, Bld: 72 mg/dL (ref 70–99)
Potassium: 4.3 mmol/L (ref 3.5–5.1)
Sodium: 134 mmol/L — ABNORMAL LOW (ref 135–145)

## 2018-06-30 LAB — HIV ANTIBODY (ROUTINE TESTING W REFLEX): HIV Screen 4th Generation wRfx: NONREACTIVE

## 2018-06-30 LAB — CBC
HCT: 43.1 % (ref 39.0–52.0)
Hemoglobin: 14.5 g/dL (ref 13.0–17.0)
MCH: 28.4 pg (ref 26.0–34.0)
MCHC: 33.6 g/dL (ref 30.0–36.0)
MCV: 84.5 fL (ref 80.0–100.0)
Platelets: 196 10*3/uL (ref 150–400)
RBC: 5.1 MIL/uL (ref 4.22–5.81)
RDW: 13.1 % (ref 11.5–15.5)
WBC: 9.5 10*3/uL (ref 4.0–10.5)
nRBC: 0 % (ref 0.0–0.2)

## 2018-06-30 MED ORDER — ONDANSETRON HCL 4 MG/2ML IJ SOLN
4.0000 mg | Freq: Once | INTRAMUSCULAR | Status: AC
  Administered 2018-06-30: 4 mg via INTRAVENOUS
  Filled 2018-06-30: qty 2

## 2018-06-30 MED ORDER — ONDANSETRON HCL 4 MG/2ML IJ SOLN
4.0000 mg | Freq: Four times a day (QID) | INTRAMUSCULAR | Status: DC | PRN
  Administered 2018-06-30 – 2018-07-11 (×6): 4 mg via INTRAVENOUS
  Filled 2018-06-30 (×6): qty 2

## 2018-06-30 MED ORDER — PHENOL 1.4 % MT LIQD: 1.0000 | OROMUCOSAL | Status: DC | PRN

## 2018-06-30 MED ORDER — POTASSIUM CHLORIDE 10 MEQ/100ML IV SOLN
10.0000 meq | INTRAVENOUS | Status: AC
  Administered 2018-06-30 (×5): 10 meq via INTRAVENOUS
  Filled 2018-06-30 (×4): qty 100

## 2018-06-30 MED ORDER — PROMETHAZINE HCL 25 MG/ML IJ SOLN
6.2500 mg | Freq: Once | INTRAMUSCULAR | Status: AC
  Administered 2018-06-30: 02:00:00 6.25 mg via INTRAVENOUS
  Filled 2018-06-30: qty 1

## 2018-06-30 MED ORDER — MENTHOL 3 MG MT LOZG
1.0000 | LOZENGE | OROMUCOSAL | Status: DC | PRN
  Administered 2018-06-30 – 2018-07-12 (×3): 3 mg via ORAL
  Filled 2018-06-30 (×4): qty 9

## 2018-06-30 MED ORDER — POTASSIUM CHLORIDE 10 MEQ/100ML IV SOLN
10.0000 meq | INTRAVENOUS | Status: AC
  Administered 2018-06-30: 15:00:00 10 meq via INTRAVENOUS
  Filled 2018-06-30: qty 100

## 2018-06-30 NOTE — Progress Notes (Signed)
  Date: 06/30/2018  Patient name: Jack Fuentes  Medical record number: [id number]  Date of birth: [birth year]   I have seen and evaluated Jack Fuentes and discussed their care with the Residency Team.  Jack Fuentes is a 73 year old man with retroperitoneal and mesenteric non-Hodgkin's lymphoma which was diagnosed in 2000 and is status post chemo/radiation.  He frequently gets episodes of nausea, vomiting, and inability to eat which he attributes to self-limiting SBO's.  These occurred 3-4 times a year and usually last only a day or less.  This is the first episode that has gone on for this length of time.  He first noticed inability to keep food down about a week prior to admission along with nausea and vomiting.  CT scan in the ED showed a high-grade SBO with transition point associated with a large mesenteric calcifications in the mid abdomen.  He has had an NG placed with significant bilious output.  This morning, he feels nauseous, has not had any gas, but denies abdominal pain.  PMHx, Fam Hx, and/or Soc Hx : His only remaining family are 2 great aunts with dementia and nursing homes.  His close friend has died so he has no friends.  He is retired from [employer].  Vitals:   06/29/18 1943 06/30/18 0515  BP: 105/76 (!) 127/96  Pulse: 86 99  Resp: 18   Temp: 98.7 F (37.1 C) 98.2 F (36.8 C)  SpO2: 97% 97%  General no acute distress with NG in nostril Heart regular rhythm no murmur rubs or gallops Abdomen no bowel sounds soft and nontender Skin warm and dry  Sodium 130 - 133 K 3.2 - 2.9 despite repletion Cr 1.78 - 1.62 Albumin 3.3 - 3.0 WBC 12 - 9.5 HgB 15.5 - 14.5  I independently viewed his portable abdominal film dated this morning which showed gaseous distention but no gas in the small bowel.  I independently reviewed his EKG tracing which showed sinus rhythm and a RBBB  Assessment and Plan: I have seen and evaluated the patient as outlined above. I agree with the  formulated Assessment and Plan as detailed in the residents' note, with the following changes:  Jack Fuentes is a 73 year old man with retroperitoneal and mesenteric non-Hodgkin's lymphoma which was diagnosed in 2000 and is status post chemo/radiation.  He has central mesenteric calcifications which are chronic since 2008 and associated with his current SBO.  This is his first SBO requiring hospitalization.  He is responding well to NG, bowel rest, and IV fluids.  Surgery is following and due to lack of return of bowel function, his NG will remain in place today.  1.  SBO - cont NG, IV fluids, n.p.o., antiemetics, and potassium replacement.  Follow along with surgery.  2.  Loneliness in an elderly patient - maybe his PCP might be able to address in the outpatient setting.  Other issues per Dr. Nelia Shi H&P and progress note today.  [name], MD 4/25/202011:19 AM

## 2018-06-30 NOTE — Progress Notes (Signed)
   Subjective: HD#1   Overnight:NG tube had copious bilious output.   Today, he endorses nausea and soreness from laying in the bed. Denies abdominal pain, flatulence, fevers, chills. Burning pain in arm from K+ repletion has improved after decreasing the rate of administration. He reports prior episodes of nausea, occurring 3-4 times a year. Usually, his symptoms will resolve after 1 day. He also notes his throat is sore.  Objective:  Vital signs in last 24 hours: Vitals:   06/29/18 1630 06/29/18 1710 06/29/18 1943 06/30/18 0515  BP: 101/81 112/79 105/76 (!) 127/96  Pulse: (!) 111 90 86 99  Resp: 16 16 18    Temp:  97.9 F (36.6 C) 98.7 F (37.1 C) 98.2 F (36.8 C)  TempSrc:  Oral Oral Oral  SpO2: 100% 100% 97% 97%  Weight:      Height:       Const:In NAD, lying in bed comfortably  HEENT: NG Tube in place Resp: CTA anteriorly  CV: RRR, no MGR Abd: BS minimal-absent, NTTP   Assessment/Plan:  Active Problems:   SBO (small bowel obstruction) ([place])  Jack Fuentes is a 73 year old gentleman with retroperitoneal/mesenteric non-Hodgkin's lymphoma (dx 2000) status post chemo/radiation, HTN, Hypertriglyceridemia, LBBB who present with acute onset nausea and vomiting found to have severe small bowel obstruction on CT abdomen.   #Small bowel obstruction: Nausea still persistent but vomiting has resolved. He has not passed flatus yet. Total NG tube output since admission of 2.8L billous fluid (2.0 in past 24 hours). His abdominal X-ray shows distention of the stomach however no small bowel dilation. -Continue maintenance with NS 150/hour while NPO -Please appreciate general surgery recommendation -Keep NG tube in place with intermittent suction -Pain regimen: Tylenol prn per tube -Zofran 4mg  q6 prn nausea (calculated qtc of 436)  -Ambulate  #Hypertension: BP stable  -Hold valsartan 80 mg daily  #Acute kidney injury/prerenal azotemia: sCr 1.6<<1.7  - Continue IVF resuscitation    #Hypokalemia: K+ of 2.9<<3.2. Has only received 40Meq so far due to burning sensation. -IV repletion  -Follow stat bmet  #History of LBBB:  - EKG stable  #Elevated Transaminases: Improved -Continue to monitor  #Presumed dx of peripheral vertigo  -On meclizine prn at home  FEN: N.p.o., IVF, replace electrolytes as needed VTE ppx: Subcutaneous heparin CODE STATUS: Full code  Dispo: Admit patient to Inpatient with expected length of stay greater than 2 midnights.  Jean Rosenthal, MD 06/30/2018, 6:24 AM Pager: [phone] IMTS PGY-1

## 2018-06-30 NOTE — Progress Notes (Signed)
Pt. complained of pain at IV site due to potassium infusion, slowed rate to 75 mL/hr.

## 2018-06-30 NOTE — Progress Notes (Signed)
[facility] Progress Note     Subjective: CC: sbo Patient denies abdominal pain and distention is improved. Still feels nauseated. No flatus. Has not been up walking yet.   Objective: Vital signs in last 24 hours: Temp:  [97.9 F (36.6 C)-98.7 F (37.1 C)] 98.2 F (36.8 C) (04/25 0515) Pulse Rate:  [86-114] 99 (04/25 0515) Resp:  [14-27] 18 (04/24 1943) BP: (95-127)/(67-96) 127/96 (04/25 0515) SpO2:  [95 %-100 %] 97 % (04/25 0515) Weight:  [72.6 kg] 72.6 kg (04/24 1147) Last BM Date: 06/22/18  Intake/Output from previous day: 04/24 0701 - 04/25 0700 In: 3462.4 [I.V.:2439.7; IV Piggyback:1022.6] Out: 3100 [Urine:500; Emesis/NG output:2600] Intake/Output this shift: Total I/O In: -  Out: 350 [Emesis/NG output:350]  PE: Gen:  Alert, NAD, pleasant Card:  Regular rate and rhythm Pulm:  Normal effort, clear to auscultation bilaterally Abd: Soft, non-tender, non-distended, BS hypoactive Skin: warm and dry, no rashes  Psych: A&Ox3   Lab Results:  Recent Labs    06/29/18 1221 06/30/18 0255  WBC 12.0* 9.5  HGB 15.5 14.5  HCT 45.6 43.1  PLT 231 196   BMET Recent Labs    06/29/18 1221 06/30/18 0255  NA 130* 133*  K 3.2* 2.9*  CL 91* 91*  CO2 22 28  GLUCOSE 112* 87  BUN 31* 23  CREATININE 1.78* 1.62*  CALCIUM 9.4 8.9   PT/INR No results for input(s): LABPROT, INR in the last 72 hours. CMP     Component Value Date/Time   NA 133 (L) 06/30/2018 0255   K 2.9 (L) 06/30/2018 0255   CL 91 (L) 06/30/2018 0255   CO2 28 06/30/2018 0255   GLUCOSE 87 06/30/2018 0255   BUN 23 06/30/2018 0255   CREATININE 1.62 (H) 06/30/2018 0255   CALCIUM 8.9 06/30/2018 0255   PROT 5.7 (L) 06/30/2018 0255   ALBUMIN 3.0 (L) 06/30/2018 0255   AST 42 (H) 06/30/2018 0255   ALT 68 (H) 06/30/2018 0255   ALKPHOS 71 06/30/2018 0255   BILITOT 0.9 06/30/2018 0255   GFRNONAA 42 (L) 06/30/2018 0255   GFRAA 49 (L) 06/30/2018 0255   Lipase     Component Value Date/Time    LIPASE 54 (H) 06/29/2018 1221       Studies/Results: Ct Abdomen Pelvis W Contrast  Result Date: 06/29/2018 CLINICAL DATA:  73 year old with nausea and vomiting. History of lymphoma. EXAM: CT ABDOMEN AND PELVIS WITH CONTRAST TECHNIQUE: Multidetector CT imaging of the abdomen and pelvis was performed using the standard protocol following bolus administration of intravenous contrast. CONTRAST:  86mL ISOVUE-300 IOPAMIDOL (ISOVUE-300) INJECTION 61% COMPARISON:  PET-CT 04/28/2006 FINDINGS: Lower chest: Lung bases are clear. Hepatobiliary: Normal appearance of the liver, gallbladder and portal venous system. No biliary dilatation. Pancreas: Unremarkable. No pancreatic ductal dilatation or surrounding inflammatory changes. Spleen: Normal in size without focal abnormality. Adrenals/Urinary Tract: Normal adrenal glands. Chronic atrophy of the left kidney. Chronic dilatation of the left renal pelvis and left renal calices. Small hypodensities in right kidney are too small to definitively characterize. Negative for right hydronephrosis. Urinary bladder is unremarkable. Stomach/Bowel: Stomach and duodenum are distended. Duodenum measures up to 5.5 cm in diameter. Markedly dilated proximal jejunum measuring up to 6.5 cm on sequence 3, image 64. Small bowel loops in the central abdomen have mild wall thickening with mucosal enhancement. The distal small bowel and terminal ileum are decompressed. Multiple small bowel loops are encircling large clusters of central mesenteric calcifications. Findings are compatible with a high-grade small bowel obstruction.  The colon is decompressed. There is a retrocecal appendix without inflammation. Vascular/Lymphatic: Atherosclerotic calcifications in the aorta and iliac arteries without aneurysm. Soft tissue thickening along the left side of the aorta at the level of the atrophic kidney is probably associated with the atrophic kidney and similar to the prior examination. Increased left  periaortic calcifications. Again noted are extensive central mesenteric calcifications associated with small mesenteric lymph nodes. There was a similar finding on the study from 2008 although the calcifications may have slightly progressed. Mesenteric calcifications are poorly defined but roughly measures 4.9 x 4.1 cm on sequence 3, image 43. Reproductive: Prostate enlargement measuring 6.9 cm in the short axis. Other: Negative for free fluid.  Negative for free air. Musculoskeletal: No acute bone abnormality. IMPRESSION: 1. High-grade small bowel obstruction. Massive distension of the stomach, duodenum and proximal small bowel. Transition point is associated with the large mesenteric calcifications in the mid abdomen. Distal small bowel is decompressed. Mild small bowel wall thickening centered around the mesenteric calcifications. 2. Massive central mesenteric calcifications that have minimally changed since 2008. Based on the history of lymphoma, findings are most compatible with treated disease. No evidence for lymphoma recurrence. 3. Chronic atrophy of the left kidney with dilatation of the left renal collecting system, no significant change since 2008. 4.  Aortic Atherosclerosis (ICD10-I70.0). 5. Prostate enlargement. Electronically Signed   By: Markus Daft M.D.   On: 06/29/2018 15:24   Dg Abd Portable 1v-small Bowel Obstruction Protocol-initial, 8 Hr Delay  Result Date: 06/30/2018 CLINICAL DATA:  Small-bowel obstruction EXAM: PORTABLE ABDOMEN - 1 VIEW COMPARISON:  06/29/2018 FINDINGS: Unchanged position of nasogastric tube. There is hyperdense material in the gastric fundus, likely contrast material. Mid abdominal calcifications are unchanged. There is excreted contrast material in the urinary bladder. There is gaseous distention of the stomach. No dilated bowel is identified. IMPRESSION: Nasogastric tube tip and side port project over the stomach. No dilated small bowel identified. There is gaseous  distention of the stomach. Electronically Signed   By: Ulyses Jarred M.D.   On: 06/30/2018 04:33   Dg Abd Portable 1v-small Bowel Protocol-position Verification  Result Date: 06/29/2018 CLINICAL DATA:  NG tube placement EXAM: PORTABLE ABDOMEN - 1 VIEW COMPARISON:  CT 06/29/2018 FINDINGS: Lung bases are clear. Esophageal tube tip in the left upper quadrant. Dilated small bowel in the left upper quadrant consistent with history of bowel obstruction. IMPRESSION: Esophageal tube tip projects over the gastric body region. Electronically Signed   By: Donavan Foil M.D.   On: 06/29/2018 18:13    Anti-infectives: Anti-infectives (From admission, onward)   None       Assessment/Plan Hx of non-Hodgkin's lymphoma -s/p radiation and chemotherapy Hypertension Hyperlipidemia Atrophic left kidney Acute on chronic kidney disease Hyponatremia/hypokalemia - K 2.9 this AM, replacement per primary   SBO - 2.6 L of bilious material drainage after NGT placement - abd film with distended stomach this AM but decompressed small bowel, no contrast visualized - clinically abdomen appears benign but patient still feels nauseated and is not passing flatus - continue NGT on LIWS and repeat film in AM - mobilize!!   FEN: NPO/IV fluids ID:  None DVT:  He can have DVT chemical prophylaxis from our standpoint Follow-up: To be determined  LOS: 1 day    Brigid Re , [facility] 06/30/2018, 9:28 AM Pager: [phone] Consults: [phone]

## 2018-07-01 ENCOUNTER — Inpatient Hospital Stay (HOSPITAL_COMMUNITY): Payer: Medicare Other

## 2018-07-01 DIAGNOSIS — Z8679 Personal history of other diseases of the circulatory system: Secondary | ICD-10-CM

## 2018-07-01 LAB — BASIC METABOLIC PANEL
Anion gap: 14 (ref 5–15)
BUN: 18 mg/dL (ref 8–23)
CO2: 25 mmol/L (ref 22–32)
Calcium: 8.9 mg/dL (ref 8.9–10.3)
Chloride: 99 mmol/L (ref 98–111)
Creatinine, Ser: 1.48 mg/dL — ABNORMAL HIGH (ref 0.61–1.24)
GFR calc Af Amer: 55 mL/min — ABNORMAL LOW (ref >60)
GFR calc non Af Amer: 47 mL/min — ABNORMAL LOW (ref >60)
Glucose, Bld: 72 mg/dL (ref 70–99)
Potassium: 3.3 mmol/L — ABNORMAL LOW (ref 3.5–5.1)
Sodium: 138 mmol/L (ref 135–145)

## 2018-07-01 MED ORDER — POTASSIUM CHLORIDE 10 MEQ/100ML IV SOLN
10.0000 meq | INTRAVENOUS | Status: AC
  Administered 2018-07-01 (×4): 10 meq via INTRAVENOUS
  Filled 2018-07-01 (×4): qty 100

## 2018-07-01 NOTE — Progress Notes (Signed)
   Subjective: HD#2   Overnight: No acute events   Today, He states "I'm starving." He doesn't feel to nauseous this morning and last received anti-emetic last night. He was been walking the halls 2-3 times a day but has not passed gas or had a BM since admission. He wonders if trendelenburg would aid in decompressing his bowels but we are concerned this would increase his risk of aspiration.   Objective:  Vital signs in last 24 hours: Vitals:   06/30/18 0515 06/30/18 1337 06/30/18 1951 07/01/18 0413  BP: (!) 127/96 135/88 123/88 109/71  Pulse: 99 (!) 101 94 72  Resp:   18 16  Temp: 98.2 F (36.8 C) 98.2 F (36.8 C) 97.6 F (36.4 C) 98.1 F (36.7 C)  TempSrc: Oral Oral Oral Oral  SpO2: 97% 97% 91% 96%  Weight:      Height:       Const: In NAD, lying in bed comfortably  HEENT: [place]/AT, NG tube in place Resp: CTABL, no wheezes, crackles, rhonchi  CV: RRR, no MGR Abd: BS present today, NTTP  Assessment/Plan:  Active Problems:   SBO (small bowel obstruction) [facility])  Mr. 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  #Small bowel obstruction: Doing well today and has been ambulating without any issues. Denies nausea, vomiting or abdominal pain. Objectively, bowel sounds were present and abdomen was non-tender to palpation. KUB this morning reveals non-obstructive bowel gas pattern.  -Continue maintenance with NS 150/hour while NPO -Please appreciate general surgery recommendation -Keep NG tube in place with intermittent suction -Pain regimen:Tylenolprn per tube -Zofran 4mg  q6 prn nausea (calculated qtc of 436)  -Ambulate  #Hypertension: BP stable -Hold valsartan 80 mg daily  #Acute kidney injury/prerenal azotemia:sCr 1.4 <1.5<<1.7  -Continue IVF  resuscitation  #Hypokalemia: K+ of 3.3 -IV repletion  -Follow up BMP  #History of LBBB:  - EKG stable  #Elevated Transaminases: Improved -Continue to monitor  #Presumeddxof peripheral vertigo -On meclizineprn at home  FEN:N.p.o., IVF, replace electrolytes as needed VTE ppx:Subcutaneous heparin CODE STATUS:Full code  Dispo: Anticipate discharge 2-3 days  Jean Rosenthal, MD 07/01/2018, 6:35 AM Pager: [phone] IMTS PGY-1

## 2018-07-01 NOTE — Progress Notes (Signed)
[facility] Progress Note     Subjective: CC: sbo Patient with high output from NGT overnight. Nausea improved. No flatus. Ambulated in hall yesterday.   Objective: Vital signs in last 24 hours: Temp:  [97.6 F (36.4 C)-98.2 F (36.8 C)] 98.1 F (36.7 C) (04/26 0413) Pulse Rate:  [72-101] 72 (04/26 0413) Resp:  [16-18] 16 (04/26 0413) BP: (109-135)/(71-88) 109/71 (04/26 0413) SpO2:  [91 %-97 %] 96 % (04/26 0413) Last BM Date: 06/22/18  Intake/Output from previous day: 04/25 0701 - 04/26 0700 In: 3998.9 [I.V.:3451.1; IV Piggyback:547.8] Out: 5625 [Urine:375; Emesis/NG output:5250] Intake/Output this shift: No intake/output data recorded.  PE: Gen:  Alert, NAD, pleasant Card:  Regular rate and rhythm Pulm:  Normal effort, clear to auscultation bilaterally Abd: Soft, non-tender, non-distended, BS hypoactive Skin: warm and dry, no rashes  Psych: A&Ox3   Lab Results:  Recent Labs    06/29/18 1221 06/30/18 0255  WBC 12.0* 9.5  HGB 15.5 14.5  HCT 45.6 43.1  PLT 231 196   BMET Recent Labs    06/30/18 1151 07/01/18 0302  NA 134* 138  K 4.3 3.3*  CL 97* 99  CO2 18* 25  GLUCOSE 72 72  BUN 22 18  CREATININE 1.53* 1.48*  CALCIUM 8.8* 8.9   PT/INR No results for input(s): LABPROT, INR in the last 72 hours. CMP     Component Value Date/Time   NA 138 07/01/2018 0302   K 3.3 (L) 07/01/2018 0302   CL 99 07/01/2018 0302   CO2 25 07/01/2018 0302   GLUCOSE 72 07/01/2018 0302   BUN 18 07/01/2018 0302   CREATININE 1.48 (H) 07/01/2018 0302   CALCIUM 8.9 07/01/2018 0302   PROT 5.7 (L) 06/30/2018 0255   ALBUMIN 3.0 (L) 06/30/2018 0255   AST 42 (H) 06/30/2018 0255   ALT 68 (H) 06/30/2018 0255   ALKPHOS 71 06/30/2018 0255   BILITOT 0.9 06/30/2018 0255   GFRNONAA 47 (L) 07/01/2018 0302   GFRAA 55 (L) 07/01/2018 0302   Lipase     Component Value Date/Time   LIPASE 54 (H) 06/29/2018 1221       Studies/Results: Ct Abdomen Pelvis W  Contrast  Result Date: 06/29/2018 CLINICAL DATA:  73 year old with nausea and vomiting. History of lymphoma. EXAM: CT ABDOMEN AND PELVIS WITH CONTRAST TECHNIQUE: Multidetector CT imaging of the abdomen and pelvis was performed using the standard protocol following bolus administration of intravenous contrast. CONTRAST:  20mL ISOVUE-300 IOPAMIDOL (ISOVUE-300) INJECTION 61% COMPARISON:  PET-CT 04/28/2006 FINDINGS: Lower chest: Lung bases are clear. Hepatobiliary: Normal appearance of the liver, gallbladder and portal venous system. No biliary dilatation. Pancreas: Unremarkable. No pancreatic ductal dilatation or surrounding inflammatory changes. Spleen: Normal in size without focal abnormality. Adrenals/Urinary Tract: Normal adrenal glands. Chronic atrophy of the left kidney. Chronic dilatation of the left renal pelvis and left renal calices. Small hypodensities in right kidney are too small to definitively characterize. Negative for right hydronephrosis. Urinary bladder is unremarkable. Stomach/Bowel: Stomach and duodenum are distended. Duodenum measures up to 5.5 cm in diameter. Markedly dilated proximal jejunum measuring up to 6.5 cm on sequence 3, image 64. Small bowel loops in the central abdomen have mild wall thickening with mucosal enhancement. The distal small bowel and terminal ileum are decompressed. Multiple small bowel loops are encircling large clusters of central mesenteric calcifications. Findings are compatible with a high-grade small bowel obstruction. The colon is decompressed. There is a retrocecal appendix without inflammation. Vascular/Lymphatic: Atherosclerotic calcifications in the aorta and iliac  arteries without aneurysm. Soft tissue thickening along the left side of the aorta at the level of the atrophic kidney is probably associated with the atrophic kidney and similar to the prior examination. Increased left periaortic calcifications. Again noted are extensive central mesenteric  calcifications associated with small mesenteric lymph nodes. There was a similar finding on the study from 2008 although the calcifications may have slightly progressed. Mesenteric calcifications are poorly defined but roughly measures 4.9 x 4.1 cm on sequence 3, image 43. Reproductive: Prostate enlargement measuring 6.9 cm in the short axis. Other: Negative for free fluid.  Negative for free air. Musculoskeletal: No acute bone abnormality. IMPRESSION: 1. High-grade small bowel obstruction. Massive distension of the stomach, duodenum and proximal small bowel. Transition point is associated with the large mesenteric calcifications in the mid abdomen. Distal small bowel is decompressed. Mild small bowel wall thickening centered around the mesenteric calcifications. 2. Massive central mesenteric calcifications that have minimally changed since 2008. Based on the history of lymphoma, findings are most compatible with treated disease. No evidence for lymphoma recurrence. 3. Chronic atrophy of the left kidney with dilatation of the left renal collecting system, no significant change since 2008. 4.  Aortic Atherosclerosis (ICD10-I70.0). 5. Prostate enlargement. Electronically Signed   By: Markus Daft M.D.   On: 06/29/2018 15:24   Dg Abd Portable 1v  Result Date: 07/01/2018 CLINICAL DATA:  Small-bowel obstruction. EXAM: PORTABLE ABDOMEN - 1 VIEW COMPARISON:  06/30/2018 FINDINGS: Nasogastric tube tip lies in the proximal stomach. The stomach appears decompressed. Gas pattern is nonspecific. Mid abdominal calcifications are stable. IMPRESSION: Nasogastric tube tip lies in the proximal stomach. Bowel gas pattern is nonobstructive. Electronically Signed   By: Staci Righter M.D.   On: 07/01/2018 08:05   Dg Abd Portable 1v-small Bowel Obstruction Protocol-initial, 8 Hr Delay  Result Date: 06/30/2018 CLINICAL DATA:  Small-bowel obstruction EXAM: PORTABLE ABDOMEN - 1 VIEW COMPARISON:  06/29/2018 FINDINGS: Unchanged position  of nasogastric tube. There is hyperdense material in the gastric fundus, likely contrast material. Mid abdominal calcifications are unchanged. There is excreted contrast material in the urinary bladder. There is gaseous distention of the stomach. No dilated bowel is identified. IMPRESSION: Nasogastric tube tip and side port project over the stomach. No dilated small bowel identified. There is gaseous distention of the stomach. Electronically Signed   By: Ulyses Jarred M.D.   On: 06/30/2018 04:33   Dg Abd Portable 1v-small Bowel Protocol-position Verification  Result Date: 06/29/2018 CLINICAL DATA:  NG tube placement EXAM: PORTABLE ABDOMEN - 1 VIEW COMPARISON:  CT 06/29/2018 FINDINGS: Lung bases are clear. Esophageal tube tip in the left upper quadrant. Dilated small bowel in the left upper quadrant consistent with history of bowel obstruction. IMPRESSION: Esophageal tube tip projects over the gastric body region. Electronically Signed   By: Donavan Foil M.D.   On: 06/29/2018 18:13    Anti-infectives: Anti-infectives (From admission, onward)   None       Assessment/Plan Hx ofnon-Hodgkin's lymphoma-s/p radiation and chemotherapy Hypertension Hyperlipidemia Atrophic left kidney Acute on chronic kidney disease Hyponatremia/hypokalemia - K 3.3 this AM, replacement per primary   SBO - >5 L from NGT, bilious - abd film with what looks like some contrast in R colon  - clinically abdomen appears benign but patient is not passing flatus - continue NGT on LIWS and repeat film in AM - mobilize!!   FEN: NPO/IV fluids ID: None DVT: He can have DVT chemical prophylaxis from our standpoint Follow-up: To be determined  LOS: 2 days    Brigid Re , [facility] 07/01/2018, 9:26 AM Pager: [phone] Consults: [phone]

## 2018-07-01 NOTE — Progress Notes (Signed)
  Date: 07/01/2018  Patient name: Jack Fuentes  Medical record number: [id number]  Date of birth: 08/11/1945   I have seen and evaluated this patient and I have discussed the plan of care with the house staff. Please see Dr. Nelia Shi note for complete details. I concur with his findings and plan.  Reviewed Surgery note, patient may be approaching need for surgery.    Sid Falcon, MD 07/01/2018, 3:12 PM

## 2018-07-01 NOTE — Plan of Care (Signed)

## 2018-07-02 ENCOUNTER — Inpatient Hospital Stay (HOSPITAL_COMMUNITY): Payer: Medicare Other

## 2018-07-02 ENCOUNTER — Encounter (HOSPITAL_COMMUNITY): Payer: Self-pay | Admitting: General Practice

## 2018-07-02 LAB — BASIC METABOLIC PANEL
Anion gap: 13 (ref 5–15)
BUN: 16 mg/dL (ref 8–23)
CO2: 23 mmol/L (ref 22–32)
Calcium: 8.2 mg/dL — ABNORMAL LOW (ref 8.9–10.3)
Chloride: 109 mmol/L (ref 98–111)
Creatinine, Ser: 1.32 mg/dL — ABNORMAL HIGH (ref 0.61–1.24)
GFR calc Af Amer: >60 mL/min (ref >60)
GFR calc non Af Amer: 54 mL/min — ABNORMAL LOW (ref >60)
Glucose, Bld: 59 mg/dL — ABNORMAL LOW (ref 70–99)
Potassium: 3.5 mmol/L (ref 3.5–5.1)
Sodium: 145 mmol/L (ref 135–145)

## 2018-07-02 MED ORDER — DEXTROSE-NACL 5-0.45 % IV SOLN
INTRAVENOUS | Status: DC
  Administered 2018-07-02 – 2018-07-03 (×3): via INTRAVENOUS

## 2018-07-02 NOTE — Progress Notes (Signed)
   Subjective:  He states he is feeling well this morning and is passing gas. He has also been ambulating and has a strong appetite. No nausea or abdominal pain. He has not been able to eat since the 17th.   Objective:  Vital signs in last 24 hours: Vitals:   07/01/18 0413 07/01/18 1426 07/01/18 2029 07/02/18 0336  BP: 109/71 113/66 121/66 (!) 107/58  Pulse: 72 64 68 60  Resp: 16 17 14 15   Temp: 98.1 F (36.7 C) 98.4 F (36.9 C) 97.7 F (36.5 C) 98 F (36.7 C)  TempSrc: Oral Oral Oral Oral  SpO2: 96% 96% 95% 97%  Weight:      Height:       Constitution: awake, alert, pleasant gentleman lying in bed in NAD Cardio: RRR; no m/r/g Respiratory: normal work of breathing; lungs CTAB Abdominal: hypoactive bowel sounds; abdomen is soft, non-tender, non-distended  MSK: no edema   Assessment/Plan:  Active Problems:   SBO (small bowel obstruction) [facility])  Mr. 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  #Small bowel obstruction: - clinically improved; passing flatus - continues to have high output from NGT - abdominal film this morning shows persistently dilated small bowel consistent with SBO - appreciate general surgery following; will give patient another day of conservative treatment prior to proceeding with surgical intervention  - transition to D5 1/2 at 100 ml/hr while remains NPO; will need to proceed with TPN if no improvement in the next 1-2 days  -Keep NG tube in place with intermittent suction -Pain regimen:Tylenolprnper tube -Zofran 4mg  q6 prn nausea (calculated qtc of 436) -Ambulate  #Hypertension: BP stable -Hold valsartan 80 mg daily  #Acute kidney injury/prerenal azotemia: - renal function continues to improve on IVF; 1.3 today   Dispo: Anticipated discharge 2-3 days pending return of bowel function.    Modena Nunnery D, DO 07/02/2018, 7:03 AM Pager: [phone]

## 2018-07-02 NOTE — Plan of Care (Signed)
  Problem: Pain Managment: Goal: General experience of comfort will improve Outcome: Progressing   Problem: Safety: Goal: Ability to remain free from injury will improve Outcome: Progressing   Problem: Skin Integrity: Goal: Risk for impaired skin integrity will decrease Outcome: Progressing   

## 2018-07-02 NOTE — Plan of Care (Signed)
  Problem: Education: Goal: Knowledge of General Education information will improve Description Including pain rating scale, medication(s)/side effects and non-pharmacologic comfort measures Outcome: Progressing   Problem: Health Behavior/Discharge Planning: Goal: Ability to manage health-related needs will improve Outcome: Progressing   Problem: Clinical Measurements: Goal: Ability to maintain clinical measurements within normal limits will improve Outcome: Progressing Goal: Will remain free from infection Outcome: Progressing Goal: Diagnostic test results will improve Outcome: Progressing   Problem: Activity: Goal: Risk for activity intolerance will decrease Outcome: Progressing   Problem: Coping: Goal: Level of anxiety will decrease Outcome: Progressing   Problem: Elimination: Goal: Will not experience complications related to urinary retention Outcome: Progressing   Problem: Pain Managment: Goal: General experience of comfort will improve Outcome: Progressing   Problem: Safety: Goal: Ability to remain free from injury will improve Outcome: Progressing   Problem: Skin Integrity: Goal: Risk for impaired skin integrity will decrease Outcome: Progressing

## 2018-07-02 NOTE — Progress Notes (Signed)
[facility]/Trauma Progress Note      Assessment/Plan Hx ofnon-Hodgkin's lymphoma-s/p radiation and chemotherapy Hypertension Hyperlipidemia Atrophic left kidney Acute on chronic kidney disease Hyponatremia/hypokalemia- replacement per primary  SBO - high bilious/brown NGT output - abd film today with dilated small bowel consistent with SBO  - clinically abdomen appears benign but high NGT output is concerning  - continue NGT on LIWS and repeat film in AM, likely will need surgery but will give pt one more day to see if he opens up more - mobilize!!  FEN:NPO/IV fluids, if does not open soon will need to start TPN ID: None DVT: He can have DVT chemical prophylaxis from our standpoint Follow-up: To be determined   LOS: 3 days    Subjective: CC: SBO  No abdominal pain. Having a small amount of flatus. No issues overnight. Pt states he has not been able to keep anything down since the 17th. He states issues with vomiting and not being able to eat for days at a time for years now.   Objective: Vital signs in last 24 hours: Temp:  [97.7 F (36.5 C)-98.4 F (36.9 C)] 98 F (36.7 C) (04/27 0336) Pulse Rate:  [60-68] 60 (04/27 0336) Resp:  [14-17] 15 (04/27 0336) BP: (107-121)/(58-66) 107/58 (04/27 0336) SpO2:  [95 %-97 %] 97 % (04/27 0336) Last BM Date: 06/22/18  Intake/Output from previous day: 04/26 0701 - 04/27 0700 In: 0  Out: 4200 [Urine:500; Emesis/NG output:3700] Intake/Output this shift: No intake/output data recorded.  PE: Gen: Alert, NAD, pleasant Pulm: rate and effort norma  Abd: Soft, non-tender, non-distended,BS hypoactive Skin: warm and dry, no rashes  Psych: A&Ox3    Anti-infectives: Anti-infectives (From admission, onward)   None      Lab Results:  Recent Labs    06/29/18 1221 06/30/18 0255  WBC 12.0* 9.5  HGB 15.5 14.5  HCT 45.6 43.1  PLT 231 196   BMET Recent Labs    07/01/18 0302 07/02/18 0241  NA 138  145  K 3.3* 3.5  CL 99 109  CO2 25 23  GLUCOSE 72 59*  BUN 18 16  CREATININE 1.48* 1.32*  CALCIUM 8.9 8.2*   PT/INR No results for input(s): LABPROT, INR in the last 72 hours. CMP     Component Value Date/Time   NA 145 07/02/2018 0241   K 3.5 07/02/2018 0241   CL 109 07/02/2018 0241   CO2 23 07/02/2018 0241   GLUCOSE 59 (L) 07/02/2018 0241   BUN 16 07/02/2018 0241   CREATININE 1.32 (H) 07/02/2018 0241   CALCIUM 8.2 (L) 07/02/2018 0241   PROT 5.7 (L) 06/30/2018 0255   ALBUMIN 3.0 (L) 06/30/2018 0255   AST 42 (H) 06/30/2018 0255   ALT 68 (H) 06/30/2018 0255   ALKPHOS 71 06/30/2018 0255   BILITOT 0.9 06/30/2018 0255   GFRNONAA 54 (L) 07/02/2018 0241   GFRAA >60 07/02/2018 0241   Lipase     Component Value Date/Time   LIPASE 54 (H) 06/29/2018 1221    Studies/Results: Dg Abd Portable 1v  Result Date: 07/02/2018 CLINICAL DATA:  Small bowel obstruction. EXAM: PORTABLE ABDOMEN - 1 VIEW COMPARISON:  CT 06/29/2018.  Abdominal radiograph 07/01/2018 FINDINGS: Persistent gas-filled dilated loops of small bowel in the left abdomen. Again noted are numerous mesenteric calcifications. A nasogastric tube is not identified on this single supine image. There is gas in the rectum. Limited evaluation for free air on this supine image. IMPRESSION: Dilated gas-filled loops of small bowel in  left abdomen. Findings remain compatible with a small bowel obstruction. Electronically Signed   By: Markus Daft M.D.   On: 07/02/2018 08:35   Dg Abd Portable 1v  Result Date: 07/01/2018 CLINICAL DATA:  Small-bowel obstruction. EXAM: PORTABLE ABDOMEN - 1 VIEW COMPARISON:  06/30/2018 FINDINGS: Nasogastric tube tip lies in the proximal stomach. The stomach appears decompressed. Gas pattern is nonspecific. Mid abdominal calcifications are stable. IMPRESSION: Nasogastric tube tip lies in the proximal stomach. Bowel gas pattern is nonobstructive. Electronically Signed   By: Staci Righter M.D.   On: 07/01/2018 08:05       Kalman Drape , [facility] 07/02/2018, 9:46 AM  Pager: [phone] Mon-Wed, Friday 7:00am-4:30pm Thurs 7am-11:30am  Consults: [phone]

## 2018-07-02 NOTE — Progress Notes (Signed)
  Date: 07/02/2018  Patient name: Jack Fuentes  Medical record number: [id number]  Date of birth: [birth year]   I have seen and evaluated this patient and I have discussed the plan of care with the house staff. Please see Dr. Janne Napoleon note for complete details. I concur with her findings and plan.     Sid Falcon, MD 07/02/2018, 4:01 PM

## 2018-07-03 ENCOUNTER — Encounter (HOSPITAL_COMMUNITY)
Admission: EM | Disposition: A | Discharge status: Skilled Nursing Facility | Payer: Medicare Other | Source: Home / Self Care | Attending: Internal Medicine

## 2018-07-03 ENCOUNTER — Inpatient Hospital Stay (HOSPITAL_COMMUNITY): Payer: Medicare Other | Admitting: Anesthesiology

## 2018-07-03 ENCOUNTER — Encounter (HOSPITAL_COMMUNITY): Payer: Self-pay | Admitting: Anesthesiology

## 2018-07-03 ENCOUNTER — Inpatient Hospital Stay (HOSPITAL_COMMUNITY): Payer: Medicare Other

## 2018-07-03 ENCOUNTER — Inpatient Hospital Stay: Payer: Self-pay

## 2018-07-03 HISTORY — PX: LAPAROTOMY: SHX154

## 2018-07-03 LAB — BASIC METABOLIC PANEL
Anion gap: 12 (ref 5–15)
BUN: 13 mg/dL (ref 8–23)
CO2: 27 mmol/L (ref 22–32)
Calcium: 8.6 mg/dL — ABNORMAL LOW (ref 8.9–10.3)
Chloride: 105 mmol/L (ref 98–111)
Creatinine, Ser: 1.3 mg/dL — ABNORMAL HIGH (ref 0.61–1.24)
GFR calc Af Amer: >60 mL/min (ref >60)
GFR calc non Af Amer: 55 mL/min — ABNORMAL LOW (ref >60)
Glucose, Bld: 107 mg/dL — ABNORMAL HIGH (ref 70–99)
Potassium: 3.1 mmol/L — ABNORMAL LOW (ref 3.5–5.1)
Sodium: 144 mmol/L (ref 135–145)

## 2018-07-03 LAB — PREPARE RBC (CROSSMATCH)

## 2018-07-03 LAB — ABO/RH: ABO/RH(D): A POS

## 2018-07-03 LAB — SURGICAL PCR SCREEN
MRSA, PCR: NEGATIVE
Staphylococcus aureus: POSITIVE — AB

## 2018-07-03 LAB — GLUCOSE, CAPILLARY: Glucose-Capillary: 126 mg/dL — ABNORMAL HIGH (ref 70–99)

## 2018-07-03 SURGERY — LAPAROTOMY, EXPLORATORY
Anesthesia: General | Site: Abdomen

## 2018-07-03 MED ORDER — OXYCODONE HCL 5 MG/5ML PO SOLN: 5.0000 mg | Freq: Once | ORAL | Status: DC | PRN

## 2018-07-03 MED ORDER — SODIUM CHLORIDE 0.9 % IV SOLN
2.0000 g | INTRAVENOUS | Status: AC
  Administered 2018-07-03: 2 g via INTRAVENOUS
  Filled 2018-07-03: qty 2

## 2018-07-03 MED ORDER — POTASSIUM CHLORIDE 10 MEQ/100ML IV SOLN: 10.0000 meq | INTRAVENOUS | Status: DC

## 2018-07-03 MED ORDER — CHLORHEXIDINE GLUCONATE CLOTH 2 % EX PADS: 6.0000 | MEDICATED_PAD | Freq: Once | CUTANEOUS | Status: DC

## 2018-07-03 MED ORDER — POTASSIUM CHLORIDE IN NACL 40-0.9 MEQ/L-% IV SOLN
INTRAVENOUS | Status: AC
  Administered 2018-07-03 – 2018-07-04 (×2): 175 mL/h via INTRAVENOUS
  Administered 2018-07-04: 75 mL/h via INTRAVENOUS
  Filled 2018-07-03 (×4): qty 1000

## 2018-07-03 MED ORDER — ALBUMIN HUMAN 5 % IV SOLN
INTRAVENOUS | Status: DC | PRN
  Administered 2018-07-03: 16:00:00 via INTRAVENOUS

## 2018-07-03 MED ORDER — DEXTROSE-NACL 5-0.45 % IV SOLN: INTRAVENOUS | Status: DC

## 2018-07-03 MED ORDER — INSULIN ASPART 100 UNIT/ML ~~LOC~~ SOLN
0.0000 [IU] | SUBCUTANEOUS | Status: DC
  Administered 2018-07-03 – 2018-07-04 (×2): 1 [IU] via SUBCUTANEOUS
  Administered 2018-07-04: 3 [IU] via SUBCUTANEOUS
  Administered 2018-07-04 (×2): 2 [IU] via SUBCUTANEOUS
  Administered 2018-07-05 – 2018-07-09 (×5): 1 [IU] via SUBCUTANEOUS

## 2018-07-03 MED ORDER — TRAVASOL 10 % IV SOLN
INTRAVENOUS | Status: AC
  Administered 2018-07-03: 21:00:00 via INTRAVENOUS
  Filled 2018-07-03: qty 549.6

## 2018-07-03 MED ORDER — SUGAMMADEX SODIUM 200 MG/2ML IV SOLN
INTRAVENOUS | Status: DC | PRN
  Administered 2018-07-03 (×2): 100 mg via INTRAVENOUS

## 2018-07-03 MED ORDER — SODIUM CHLORIDE 0.9% IV SOLUTION: Freq: Once | INTRAVENOUS | Status: DC

## 2018-07-03 MED ORDER — DEXAMETHASONE SODIUM PHOSPHATE 10 MG/ML IJ SOLN
INTRAMUSCULAR | Status: AC
  Filled 2018-07-03: qty 2

## 2018-07-03 MED ORDER — SODIUM CHLORIDE 0.9 % IV SOLN
INTRAVENOUS | Status: DC | PRN
  Administered 2018-07-03: 35 ug/min via INTRAVENOUS

## 2018-07-03 MED ORDER — 0.9 % SODIUM CHLORIDE (POUR BTL) OPTIME
TOPICAL | Status: DC | PRN
  Administered 2018-07-03: 2000 mL

## 2018-07-03 MED ORDER — MORPHINE SULFATE (PF) 2 MG/ML IV SOLN
1.0000 mg | INTRAVENOUS | Status: DC | PRN
  Administered 2018-07-03 (×2): 2 mg via INTRAVENOUS
  Administered 2018-07-03: 3 mg via INTRAVENOUS
  Administered 2018-07-04 (×3): 2 mg via INTRAVENOUS
  Filled 2018-07-03: qty 1
  Filled 2018-07-03: qty 2
  Filled 2018-07-03 (×4): qty 1

## 2018-07-03 MED ORDER — ROCURONIUM BROMIDE 50 MG/5ML IV SOSY
PREFILLED_SYRINGE | INTRAVENOUS | Status: AC
  Filled 2018-07-03: qty 15

## 2018-07-03 MED ORDER — INSULIN ASPART 100 UNIT/ML ~~LOC~~ SOLN: 0.0000 [IU] | SUBCUTANEOUS | Status: DC

## 2018-07-03 MED ORDER — ROCURONIUM BROMIDE 50 MG/5ML IV SOSY
PREFILLED_SYRINGE | INTRAVENOUS | Status: DC | PRN
  Administered 2018-07-03: 50 mg via INTRAVENOUS
  Administered 2018-07-03: 30 mg via INTRAVENOUS
  Administered 2018-07-03: 10 mg via INTRAVENOUS

## 2018-07-03 MED ORDER — SUCCINYLCHOLINE CHLORIDE 200 MG/10ML IV SOSY
PREFILLED_SYRINGE | INTRAVENOUS | Status: AC
  Filled 2018-07-03: qty 20

## 2018-07-03 MED ORDER — PROPOFOL 10 MG/ML IV BOLUS
INTRAVENOUS | Status: AC
  Filled 2018-07-03: qty 20

## 2018-07-03 MED ORDER — ONDANSETRON HCL 4 MG/2ML IJ SOLN
INTRAMUSCULAR | Status: AC
  Filled 2018-07-03: qty 4

## 2018-07-03 MED ORDER — LACTATED RINGERS IV SOLN
INTRAVENOUS | Status: DC
  Administered 2018-07-03 – 2018-07-10 (×7): via INTRAVENOUS

## 2018-07-03 MED ORDER — FENTANYL CITRATE (PF) 100 MCG/2ML IJ SOLN
25.0000 ug | INTRAMUSCULAR | Status: DC | PRN
  Administered 2018-07-03 (×2): 25 ug via INTRAVENOUS

## 2018-07-03 MED ORDER — FENTANYL CITRATE (PF) 100 MCG/2ML IJ SOLN
INTRAMUSCULAR | Status: AC
  Filled 2018-07-03: qty 2

## 2018-07-03 MED ORDER — SODIUM CHLORIDE 0.9% FLUSH
10.0000 mL | Freq: Two times a day (BID) | INTRAVENOUS | Status: DC
  Administered 2018-07-09 – 2018-07-12 (×4): 10 mL

## 2018-07-03 MED ORDER — PHENYLEPHRINE 40 MCG/ML (10ML) SYRINGE FOR IV PUSH (FOR BLOOD PRESSURE SUPPORT)
PREFILLED_SYRINGE | INTRAVENOUS | Status: DC | PRN
  Administered 2018-07-03: 120 ug via INTRAVENOUS
  Administered 2018-07-03 (×2): 80 ug via INTRAVENOUS

## 2018-07-03 MED ORDER — MUPIROCIN 2 % EX OINT
1.0000 "application " | TOPICAL_OINTMENT | Freq: Two times a day (BID) | CUTANEOUS | Status: AC
  Administered 2018-07-03 – 2018-07-08 (×10): 1 via NASAL
  Filled 2018-07-03 (×2): qty 22

## 2018-07-03 MED ORDER — FENTANYL CITRATE (PF) 100 MCG/2ML IJ SOLN
INTRAMUSCULAR | Status: DC | PRN
  Administered 2018-07-03: 50 ug via INTRAVENOUS
  Administered 2018-07-03 (×2): 100 ug via INTRAVENOUS

## 2018-07-03 MED ORDER — FENTANYL CITRATE (PF) 250 MCG/5ML IJ SOLN
INTRAMUSCULAR | Status: AC
  Filled 2018-07-03: qty 5

## 2018-07-03 MED ORDER — SODIUM CHLORIDE 0.9 % IV SOLN
INTRAVENOUS | Status: DC
  Administered 2018-07-03: 10:00:00 via INTRAVENOUS
  Filled 2018-07-03 (×5): qty 1000

## 2018-07-03 MED ORDER — LIDOCAINE 2% (20 MG/ML) 5 ML SYRINGE
INTRAMUSCULAR | Status: DC | PRN
  Administered 2018-07-03: 60 mg via INTRAVENOUS

## 2018-07-03 MED ORDER — OXYCODONE HCL 5 MG PO TABS: 5.0000 mg | ORAL_TABLET | Freq: Once | ORAL | Status: DC | PRN

## 2018-07-03 MED ORDER — DEXAMETHASONE SODIUM PHOSPHATE 10 MG/ML IJ SOLN
INTRAMUSCULAR | Status: DC | PRN
  Administered 2018-07-03: 5 mg via INTRAVENOUS

## 2018-07-03 MED ORDER — ONDANSETRON HCL 4 MG/2ML IJ SOLN
INTRAMUSCULAR | Status: DC | PRN
  Administered 2018-07-03: 4 mg via INTRAVENOUS

## 2018-07-03 MED ORDER — POTASSIUM CHLORIDE 10 MEQ/100ML IV SOLN
10.0000 meq | INTRAVENOUS | Status: AC
  Administered 2018-07-03 (×3): 10 meq via INTRAVENOUS
  Filled 2018-07-03 (×3): qty 100

## 2018-07-03 MED ORDER — CEFOTETAN DISODIUM-DEXTROSE 2-2.08 GM-%(50ML) IV SOLR
2.0000 g | INTRAVENOUS | Status: DC
  Filled 2018-07-03: qty 50

## 2018-07-03 MED ORDER — SUCCINYLCHOLINE CHLORIDE 20 MG/ML IJ SOLN
INTRAMUSCULAR | Status: DC | PRN
  Administered 2018-07-03: 140 mg via INTRAVENOUS

## 2018-07-03 MED ORDER — METHOCARBAMOL 1000 MG/10ML IJ SOLN
500.0000 mg | Freq: Four times a day (QID) | INTRAVENOUS | Status: DC | PRN
  Administered 2018-07-03 – 2018-07-04 (×3): 500 mg via INTRAVENOUS
  Filled 2018-07-03 (×4): qty 5

## 2018-07-03 MED ORDER — PROPOFOL 10 MG/ML IV BOLUS
INTRAVENOUS | Status: DC | PRN
  Administered 2018-07-03: 160 mg via INTRAVENOUS

## 2018-07-03 MED ORDER — ONDANSETRON HCL 4 MG/2ML IJ SOLN: 4.0000 mg | Freq: Once | INTRAMUSCULAR | Status: DC | PRN

## 2018-07-03 MED ORDER — LIDOCAINE 2% (20 MG/ML) 5 ML SYRINGE
INTRAMUSCULAR | Status: AC
  Filled 2018-07-03: qty 15

## 2018-07-03 MED ORDER — SODIUM CHLORIDE 0.9% FLUSH
10.0000 mL | INTRAVENOUS | Status: DC | PRN
  Administered 2018-07-04 – 2018-07-13 (×5): 10 mL
  Filled 2018-07-03 (×5): qty 40

## 2018-07-03 SURGICAL SUPPLY — 52 items
BIOPATCH RED 1 DISK 7.0 (GAUZE/BANDAGES/DRESSINGS) ×1 IMPLANT
BIOPATCH RED 1IN DISK 7.0MM (GAUZE/BANDAGES/DRESSINGS) ×1
BLADE CLIPPER SURG (BLADE) ×2 IMPLANT
CANISTER SUCT 3000ML PPV (MISCELLANEOUS) ×3 IMPLANT
CHLORAPREP W/TINT 26ML (MISCELLANEOUS) ×3 IMPLANT
COVER SURGICAL LIGHT HANDLE (MISCELLANEOUS) ×3 IMPLANT
COVER WAND RF STERILE (DRAPES) ×3 IMPLANT
DRAIN CHANNEL 19F RND (DRAIN) ×2 IMPLANT
DRAPE LAPAROSCOPIC ABDOMINAL (DRAPES) ×3 IMPLANT
DRAPE WARM FLUID 44X44 (DRAPE) ×3 IMPLANT
DRSG OPSITE POSTOP 4X10 (GAUZE/BANDAGES/DRESSINGS) ×2 IMPLANT
DRSG OPSITE POSTOP 4X8 (GAUZE/BANDAGES/DRESSINGS) IMPLANT
DRSG TEGADERM 2-3/8X2-3/4 SM (GAUZE/BANDAGES/DRESSINGS) ×2 IMPLANT
ELECT BLADE 6.5 EXT (BLADE) ×2 IMPLANT
ELECT CAUTERY BLADE 6.4 (BLADE) ×3 IMPLANT
ELECT REM PT RETURN 9FT ADLT (ELECTROSURGICAL) ×3
ELECTRODE REM PT RTRN 9FT ADLT (ELECTROSURGICAL) ×1 IMPLANT
EVACUATOR SILICONE 100CC (DRAIN) ×2 IMPLANT
GLOVE BIO SURGEON STRL SZ7.5 (GLOVE) ×3 IMPLANT
GLOVE EUDERMIC 7 POWDERFREE (GLOVE) ×2 IMPLANT
GOWN STRL REUS W/ TWL LRG LVL3 (GOWN DISPOSABLE) ×2 IMPLANT
GOWN STRL REUS W/ TWL XL LVL3 (GOWN DISPOSABLE) IMPLANT
GOWN STRL REUS W/TWL LRG LVL3 (GOWN DISPOSABLE) ×6
GOWN STRL REUS W/TWL XL LVL3 (GOWN DISPOSABLE) ×2
KIT BASIN OR (CUSTOM PROCEDURE TRAY) ×3 IMPLANT
KIT TURNOVER KIT B (KITS) ×3 IMPLANT
LIGASURE IMPACT 36 18CM CVD LR (INSTRUMENTS) ×2 IMPLANT
NS IRRIG 1000ML POUR BTL (IV SOLUTION) ×6 IMPLANT
PACK GENERAL/GYN (CUSTOM PROCEDURE TRAY) ×3 IMPLANT
PAD ARMBOARD 7.5X6 YLW CONV (MISCELLANEOUS) ×3 IMPLANT
PENCIL SMOKE EVACUATOR (MISCELLANEOUS) ×3 IMPLANT
RELOAD PROXIMATE 75MM BLUE (ENDOMECHANICALS) ×12 IMPLANT
RELOAD PROXIMATE TA60MM BLUE (ENDOMECHANICALS) ×3 IMPLANT
RELOAD STAPLE 60 BLU REG PROX (ENDOMECHANICALS) IMPLANT
RELOAD STAPLE 75 3.8 BLU REG (ENDOMECHANICALS) IMPLANT
SPECIMEN JAR LARGE (MISCELLANEOUS) ×2 IMPLANT
SPONGE LAP 18X18 RF (DISPOSABLE) ×2 IMPLANT
STAPLER GUN LINEAR PROX 60 (STAPLE) ×2 IMPLANT
STAPLER PROXIMATE 75MM BLUE (STAPLE) ×2 IMPLANT
STAPLER VISISTAT 35W (STAPLE) ×5 IMPLANT
SUCTION POOLE TIP (SUCTIONS) ×3 IMPLANT
SUT ETHILON 2 0 FS 18 (SUTURE) ×2 IMPLANT
SUT PDS AB 1 TP1 96 (SUTURE) ×6 IMPLANT
SUT SILK 0 FSL (SUTURE) ×4 IMPLANT
SUT SILK 2 0 SH CR/8 (SUTURE) ×13 IMPLANT
SUT SILK 2 0 TIES 10X30 (SUTURE) ×3 IMPLANT
SUT SILK 3 0 SH CR/8 (SUTURE) ×3 IMPLANT
SUT SILK 3 0 TIES 10X30 (SUTURE) ×3 IMPLANT
SUT VIC AB 3-0 SH 18 (SUTURE) IMPLANT
TOWEL OR 17X26 10 PK STRL BLUE (TOWEL DISPOSABLE) ×3 IMPLANT
TRAY FOLEY MTR SLVR 16FR STAT (SET/KITS/TRAYS/PACK) ×2 IMPLANT
YANKAUER SUCT BULB TIP NO VENT (SUCTIONS) IMPLANT

## 2018-07-03 NOTE — Op Note (Signed)
07/03/2018  5:14 PM  PATIENT:  Jack Fuentes  73 y.o. male  PRE-OPERATIVE DIAGNOSIS:  Small Bowel Obstruction  POST-OPERATIVE DIAGNOSIS:  Small Bowel Obstruction  PROCEDURE:  Procedure(s): EXPLORATORY LAPAROTOMY, LYSIS OF ADHESION, SMALL BOWEL RESECTION WITH RESECTION OF CALCIFIED MESENTERIC MASS, DUODENORRAPHY, EXCISION ECTOPIC CALCIFICATION ABDOMINAL WALL  SURGEON:  Surgeon(s) and Role:    * Jovita Kussmaul, MD - Primary  PHYSICIAN ASSISTANT:   ASSISTANTS: Dr. Dalbert Batman   ANESTHESIA:   general  EBL:  300 mL   BLOOD ADMINISTERED:none  DRAINS: (1) Jackson-Pratt drain(s) with closed bulb suction in the area of 4th portion of duodenum   LOCAL MEDICATIONS USED:  NONE  SPECIMEN:  Source of Specimen:  ectopic calcification abd wall, small bowel with mesenteric calcified mass  DISPOSITION OF SPECIMEN:  PATHOLOGY  COUNTS:  YES  TOURNIQUET:  * No tourniquets in log *  DICTATION: .Dragon Dictation   After informed consent was obtained the patient was brought to the operating room and placed in the supine position on the operating table.  After adequate induction of general anesthesia the patient's abdomen was prepped with ChloraPrep, allowed to dry, and draped in usual sterile manner.  An appropriate timeout was performed.  A midline incision was then made with a 10 blade knife.  The incision was carried through the skin and subcutaneous tissue sharply with electrocautery until the linea alba was identified.  The linea alba was also incised with the electrocautery and the preperitoneal space was probed bluntly with a hemostat until the peritoneum was opened and access was gained to the abdominal cavity.  The rest of the incision was opened under direct vision.  The abdomen was generally inspected.  The main abnormality that was identified was a large mass in the central abdomen.  The proximal small bowel was dilated and the distal small bowel was decompressed.  There were quite a few  loops of mid small bowel that were densely stuck to the calcified mass in the small bowel mesentery.  This was the obvious source of obstruction.  On further evaluation there was no way to remove any of the obstructed loops of small bowel without removing the mass as well because of how hard and calcified the mass was.  Because of this we started with dividing the small bowel above and below the site of obstruction.  The dissection was then carried into the small bowel mesentery sharply with the LigaSure.  When the dissection reached the edge of the calcified mass we then sequentially divided the mesentery immediately adjacent to the mass by serially clamping it with Kelly clamps, dividing the mesentery with curved Mayos, and suture ligating the vessels with 2-0 silk suture ligatures.  We stayed as close to the calcified mass as we possibly could in order to spare as much of the blood supply to the small bowel as we possibly could.  Once this was accomplished then we were able to remove the mass with the obstructed loops of small bowel.  At this point we did identify a small hole in the fourth portion of the duodenum which was in close proximity to the bottom of the calcified mass.  The edges appeared healthy.  This was repaired with multiple interrupted 2-0 silk stitches.  The repair appeared watertight.  The omentum was then tacked over the repair with interrupted 2-0 silk stitches.  At this point we reexamined the small bowel and there was some ischemic changes at the very stapled off edge.  The small bowel edge was then removed back to where the bowel appeared healthy.  The proximal and distal segments easily approximated each other.  Next a small opening was made at the antimesenteric stapled edge of the proximal and distal small bowel.  Each limb of a GIA-75 stapler was placed the appropriate limb of small bowel, clamped, and fired thereby creating a nice widely patent enteroenterostomy.  The common opening  was then closed with a TA 60 stapler.  The staple line was imbricated with multiple 2-0 silk stitches.  There was a very large opening to the mesenteric defect and because of its proximity to the duodenal repair we decided to leave it open rather than narrow it down.  2 small areas of ectopic calcification were also removed from the anterior abdominal wall near the fascial edge and sent separately to pathology.  At this point the remaining small bowel appeared healthy.  There was 140 cm of remaining small bowel.  No other abnormalities were identified on inspection of the abdominal cavity.  The abdomen at this point was irrigated with copious amounts of saline.  The NG tube was confirmed in the stomach.  The fascia of the anterior abdominal wall was then closed with 2 running #1 double-stranded looped PDS sutures.  The subcutaneous tissue was irrigated with copious amounts of saline.  The skin was then closed with staples.  Sterile dressings were applied.  The patient tolerated the procedure well.  At the end of the case all needle sponge and instrument counts were correct.  The patient was then awakened and taken to recovery in stable condition.  The assistant was instrumental for retraction and visualization during the case  PLAN OF CARE: Admit to inpatient   PATIENT DISPOSITION:  PACU - hemodynamically stable.   Delay start of Pharmacological VTE agent (>24hrs) due to surgical blood loss or risk of bleeding: no

## 2018-07-03 NOTE — Transfer of Care (Signed)
Immediate Anesthesia Transfer of Care Note  Patient: Jack Fuentes  Procedure(s) Performed: EXPLORATORY LAPAROTOMY, LYSIS OF ADHESION, BOWEL RESECTION, RESECTION MESTASTATIC TUMOR (N/A Abdomen)  Patient Location: PACU  Anesthesia Type:General  Level of Consciousness: drowsy  Airway & Oxygen Therapy: Patient Spontanous Breathing and Patient connected to face mask oxygen  Post-op Assessment: Report given to RN and Post -op Vital signs reviewed and stable  Post vital signs: Reviewed and stable  Last Vitals:  Vitals Value Taken Time  BP 106/54 07/03/2018  5:30 PM  Temp    Pulse 77 07/03/2018  5:28 PM  Resp 27 07/03/2018  5:31 PM  SpO2 98 % 07/03/2018  5:28 PM  Vitals shown include unvalidated device data.  Last Pain:  Vitals:   07/03/18 1404  TempSrc:   PainSc: 0-No pain      Patients Stated Pain Goal: 2 (62/44/69 5072)  Complications: No apparent anesthesia complications

## 2018-07-03 NOTE — Progress Notes (Signed)
The patient is awaiting PICC line placement.

## 2018-07-03 NOTE — Progress Notes (Signed)
[place] NOTE   Pharmacy Consult for TPN Indication: SBO  Patient Measurements: Height: 5\' 10"  (177.8 cm) Weight: 160 lb (72.6 kg) IBW/kg (Calculated) : 73 TPN AdjBW (KG): 72.6 Body mass index is 22.96 kg/m.  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 Pt has not been able to keep anything down since 4/17. Admitted 4/24. Has had high output from the NGT since admission  GI: NGT to intermittent suction - 4300 mls  Endo: No history of DM, will start sensitive SSI Insulin requirements in the past 24 hours: 0 Lytes: K 3.1 (replaced), Ca 8.6 Renal: Scr 1.3, BUN 13, UOP 0.2 ml/kg/hr Pulm: RA Cards: Stable, HR 67, BP 122/71 Hepatobil:AST/ALT 42/68, T.Bili 0.9 Neuro: stable ID: No ID issues at this time  TPN Access: TPN start date: Nutritional Goals (per PharmD calculations on 4/28): KCal:~ 2180 kcals (30 kcal/kg) Protein: ~110 grams (1.5 gms/kg) Fluid: ~2400 mls   Goal TPN rate is 100 ml/hr (provides 110 g of protein, 307 g of dextrose, and 77.3 g of lipids which provides 2180 kCals per day, meeting 100% of patient estimated needs)  Current Nutrition:  NPO except ice chips D5 1/2NS at 100 cc/hr  Plan:  Initiate TPN at 50 mL/hr. This TPN provides 55 g of protein, 154 g of dextrose, and 38.5 g of lipids which provides 1090 kCals per day, meeting 50% of patient needs Electrolytes in TPN: will initiate with standard electrolytes Add MVI to TPN - trace elements are on back order, will only place in TPN on Monday, Wednesday and Friday Initiate sensitive SSI and adjust as needed Decrease IVMF D5 1/2NS to 50 ml/hr when TPN initiated Monitor TPN labs F/U for possible refeeding syndrome   Alanda Slim, PharmD,  [facility] Clinical Pharmacist Please see AMION for all Pharmacists' Contact Phone Numbers 07/03/2018, 8:42 AM

## 2018-07-03 NOTE — Progress Notes (Signed)
Initial Nutrition Assessment  RD working remotely.  DOCUMENTATION CODES:   Not applicable  INTERVENTION:   -TPN management per pharmacy -RD will follow for diet advancement and supplement as appropriate -Recommend monitoring Mg, K, and Phos x 3 days and replete as needed due to refeeding risk  NUTRITION DIAGNOSIS:   Inadequate oral intake related to altered GI function as evidenced by NPO status.  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   4/24- NGT placed to low, intermittent suction 4/28- s/p PICC placement, TPN initiated  Reviewed I/O's: -2.7 L x 24 hours and -8.1 L since admission  UOP: 400 ml x 24 hours  NGT output: 4.3 L x 24 hours  Pt down in OR for laparotomy. Unable to obtain further nutrition-related history at this time.   Per MD notes, last PO intake has 07/02/18. Pt reported inability to have a BM or keep food or liquids down for 1 week PTA.   No wt hx available to assess at this time.   Per pharmacy note, plan to initiate TPN at 50 ml/hr at 1800. Regimen to provide 1090 kcals and 55 grams protein, which meets 50% of estimated kcal and protein needs.   Labs reviewed: K: 3.1 (inpatient orders for glycemic control are 0-9 units insulin aspart every 4 hours).   NUTRITION - FOCUSED PHYSICAL EXAM:    Most Recent Value  Orbital Region  Unable to assess  Upper Arm Region  Unable to assess  Thoracic and Lumbar Region  Unable to assess   Buccal Region  Unable to assess  Temple Region  Unable to assess  Clavicle Bone Region  Unable to assess  Clavicle and Acromion Bone Region  Unable to assess  Scapular Bone Region  Unable to assess  Dorsal Hand  Unable to assess  Patellar Region  Unable to assess  Anterior Thigh Region  Unable to assess  Posterior Calf Region  Unable to assess  Edema (RD Assessment)  Unable to assess  Hair  Unable to assess  Eyes  Unable to assess  Mouth  Unable to assess  Skin  Unable to assess  Nails  Unable to assess       Diet Order:   Diet Order            Diet NPO time specified  Diet effective midnight        Diet NPO time specified Except for: Ice Chips  Diet effective now              EDUCATION NEEDS:   No education needs have been identified at this time  Skin:  Skin Assessment: Reviewed RN Assessment  Last BM:  06/22/18  Height:   Ht Readings from Last 1 Encounters:  06/29/18 5\' 10"  (1.778 m)    Weight:   Wt Readings from Last 1 Encounters:  06/29/18 72.6 kg    Ideal Body Weight:  75.5 kg  BMI:  Body mass index is 22.96 kg/m.  Estimated Nutritional Needs:   Kcal:  2200-2400  Protein:  110-125 grams  Fluid:  >2.2 L    [employer] R[place] A. Jimmye Norman, RD, LDN, [place] Registered Dietitian II Certified Diabetes Care and Education Specialist Pager: [phone] After hours Pager: [phone]

## 2018-07-03 NOTE — Anesthesia Postprocedure Evaluation (Signed)
Anesthesia Post Note  Patient: Jack Fuentes  Procedure(s) Performed: EXPLORATORY LAPAROTOMY, LYSIS OF ADHESION, BOWEL RESECTION, RESECTION MESTASTATIC TUMOR (N/A Abdomen)     Patient location during evaluation: PACU Anesthesia Type: General Level of consciousness: awake Pain management: pain level controlled Vital Signs Assessment: post-procedure vital signs reviewed and stable Respiratory status: spontaneous breathing Cardiovascular status: stable Postop Assessment: no headache Anesthetic complications: no    Last Vitals:  Vitals:   07/03/18 0413 07/03/18 1730  BP: 122/71 (!) 106/54  Pulse: 67   Resp: 14 (!) 27  Temp: (!) 36.4 C (!) 36.2 C  SpO2: 98% 95%    Last Pain:  Vitals:   07/03/18 1404  TempSrc:   PainSc: 0-No pain                 Aili Casillas

## 2018-07-03 NOTE — Progress Notes (Signed)
  Date: 07/03/2018  Patient name: Jack Fuentes  Medical record number: [id number]  Date of birth: 1945-06-12   I have seen and evaluated this patient and I have discussed the plan of care with the house staff. Please see Dr. Janne Napoleon note for complete details. I concur with her findings and plan.   Surgical team will take for surgery today.  TPN initiated, may need for short course post op.    Sid Falcon, MD 07/03/2018, 3:33 PM

## 2018-07-03 NOTE — Anesthesia Procedure Notes (Signed)
Procedure Name: Intubation Date/Time: 07/03/2018 2:53 PM Performed by: Trinna Post., CRNA Pre-anesthesia Checklist: Patient identified, Emergency Drugs available, Suction available, Patient being monitored and Timeout performed Patient Re-evaluated:Patient Re-evaluated prior to induction Oxygen Delivery Method: Circle system utilized Preoxygenation: Pre-oxygenation with 100% oxygen Induction Type: IV induction, Rapid sequence and Cricoid Pressure applied Laryngoscope Size: Mac and 4 Grade View: Grade I Tube type: Oral Tube size: 7.5 mm Number of attempts: 1 Airway Equipment and Method: Stylet Placement Confirmation: ETT inserted through vocal cords under direct vision,  positive ETCO2 and breath sounds checked- equal and bilateral Secured at: 23 cm Tube secured with: Tape Dental Injury: Teeth and Oropharynx as per pre-operative assessment

## 2018-07-03 NOTE — Progress Notes (Signed)
Report given to [facility]- The patient is ready for the OR

## 2018-07-03 NOTE — Progress Notes (Addendum)
Subjective: Stable and alert.  No stool or flatus for 48 hours.  Denies pain or cramps. High-volume NG output X-ray shows a couple of loops of dilated small bowel.  No colon gas. Potassium 3.1.  Creatinine 1.30, somewhat better.  CBC not done today  Objective: Vital signs in last 24 hours: Temp:  [97.5 F (36.4 C)-98.1 F (36.7 C)] 97.5 F (36.4 C) (04/28 0413) Pulse Rate:  [58-67] 67 (04/28 0413) Resp:  [14-16] 14 (04/28 0413) BP: (101-122)/(57-71) 122/71 (04/28 0413) SpO2:  [96 %-98 %] 98 % (04/28 0413) Last BM Date: 06/22/18  Intake/Output from previous day: 04/27 0701 - 04/28 0700 In: 2016.6 [P.O.:60; I.V.:1926.6; NG/GT:30] Out: 4700 [Urine:400; Emesis/NG output:4300] Intake/Output this shift: No intake/output data recorded.  General appearance: Alert.  Pleasant.  Mental status normal.  No distress. Resp: clear to auscultation bilaterally GI: Soft.  Not really distended.  Nontender.  Rare bowel sounds.  Laparoscopy scars noted.  No significant hernias. Extremities: extremities normal, atraumatic, no cyanosis or edema  Lab Results:  No results for input(s): WBC, HGB, HCT, PLT in the last 72 hours. BMET Recent Labs    07/02/18 0241 07/03/18 0139  NA 145 144  K 3.5 3.1*  CL 109 105  CO2 23 27  GLUCOSE 59* 107*  BUN 16 13  CREATININE 1.32* 1.30*  CALCIUM 8.2* 8.6*   PT/INR No results for input(s): LABPROT, INR in the last 72 hours. ABG No results for input(s): PHART, HCO3 in the last 72 hours.  Invalid input(s): PCO2, PO2  Studies/Results: Dg Abd Portable 1v  Result Date: 07/03/2018 CLINICAL DATA:  Small bowel obstruction. EXAM: PORTABLE ABDOMEN - 1 VIEW COMPARISON:  July 02, 2018 KUB.  June 29, 2018 CT scan. FINDINGS: The known mesenteric calcifications seen on previous CT imaging are stable. The side port of the NG tube is just below the GE junction with the distal tip near the fundus. Dilated loops of small bowel remain, particularly in the left  abdomen. Many of the loops are noted to be fluid-filled on the previous CT scan making it difficult to completely evaluate. However, the number of dilated loops on today's study appear less in number. No free air, portal venous gas, or pneumatosis. No other changes. IMPRESSION: 1. The side port of the NG tube is just below the GE junction with the distal tip in the fundus. 2. Continued small bowel obstruction. It is difficult to compare severity in the interval as many of the dilated loops of small bowel were noted to be fluid-filled on the recent CT scan are not and are not well assessed on a KUB. 3. No other changes. Electronically Signed   By: Dorise Bullion III M.D   On: 07/03/2018 08:52   Dg Abd Portable 1v  Result Date: 07/02/2018 CLINICAL DATA:  Small bowel obstruction. EXAM: PORTABLE ABDOMEN - 1 VIEW COMPARISON:  CT 06/29/2018.  Abdominal radiograph 07/01/2018 FINDINGS: Persistent gas-filled dilated loops of small bowel in the left abdomen. Again noted are numerous mesenteric calcifications. A nasogastric tube is not identified on this single supine image. There is gas in the rectum. Limited evaluation for free air on this supine image. IMPRESSION: Dilated gas-filled loops of small bowel in left abdomen. Findings remain compatible with a small bowel obstruction. Electronically Signed   By: Markus Daft M.D.   On: 07/02/2018 08:35   [place] Ekg Site Rite  Result Date: 07/03/2018 If Site Rite image not attached, placement could not  be confirmed due to current cardiac rhythm.   Anti-infectives: Anti-infectives (From admission, onward)   None      Assessment/Plan:  SBO.    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    He now is completely obstructed but no evidence of bowel compromise or perforation or abscess     He will need laparotomy either this afternoon or tomorrow.  I have discussed this with him and with Dr. Marlou Starks who will be the surgeon    I  explained to him that he would have a midline incision, assessment of the obstructive process, likely small bowel obstruction.  Unlikely ostomy.  Hopefully he does not have recurrent lymphoma but he knows that is possible.  I discussed the indications, details, techniques, and risk of the surgery with him in detail.  He is aware of the risk of bleeding, infection, injury to adjacent organs with major reconstructive surgery, wound problems such as infection or hernia, cardiac pulmonary and thromboembolic problems.  He understands all of these issues.  All of his questions were answered.  He agrees with this plan.     Discontinue heparin     Preop orders written     Dr. Marlou Starks will decide whether this can be placed on the schedule today or tomorrow.   Hypokalemia.  Will adjust IVs Significant gastrointestinal NG losses.  Will increase IV rate History of non-Hodgkin's lymphoma, status post laparoscopy for biopsy, radiation and chemotherapy Hypertension Hyperlipidemia Atrophic left kidney Acute on chronic kidney disease    LOS: 4 days    Renelda Loma M Marquay Kruse 07/03/2018

## 2018-07-03 NOTE — Progress Notes (Signed)
   Subjective: The patient continues to feel fatigued and is in discomfort from NG tube. He has not passed flatus since yesterday. Requests some form of nutrition and is in agreement with TPN. He also agrees to proceed with surgery if that is what surgical team decides is best. Denies abdominal pain, fever, chills, or chest pain.   Objective:  Vital signs in last 24 hours: Vitals:   07/02/18 0336 07/02/18 1405 07/02/18 1934 07/03/18 0413  BP: (!) 107/58 (!) 101/57 120/67 122/71  Pulse: 60 (!) 58 62 67  Resp: 15 16 16 14   Temp: 98 F (36.7 C) 98.1 F (36.7 C) 98.1 F (36.7 C) (!) 97.5 F (36.4 C)  TempSrc: Oral Oral Oral Oral  SpO2: 97% 98% 96% 98%  Weight:      Height:       General: awake, alert, sitting up in bed in NAD CV: RRR; no m/r/g Resp: normal work of breathing; lungs CTAB GI: rare bowel sounds; abdomen is soft, non-distended, non-tender  Assessment/Plan:  Active Problems:   SBO (small bowel obstruction) (HCC)  Mr. 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  #Small bowel obstruction: - has not passed flatus in 24 hours; minimal improvement since admission - continues to have high output from NGT - abdominal film this morning unchanged  - appreciate general surgery following; will proceed with laparotomy later today versus tomorrow  - replacing K with IVF - consulted pharmacy to initiate TPN  - Keep NG tube in place with intermittent suction - Zofran 4mg  q6 prn nausea (calculated qtc of 436)  #Hypertension:  - BP on lower end of normal - holding home valsartan  #Acute kidney injury/prerenal azotemia: - renal function stable at 1.3 today - given significant GI losses, IVF have been increased - will continue to monitor daily    Dispo: Anticipated discharge pending post-op recovery   Delice Bison, DO 07/03/2018, 6:41 AM Pager: [phone]

## 2018-07-03 NOTE — Interval H&P Note (Signed)
History and Physical Interval Note:  07/03/2018 2:10 PM  Jack Fuentes  has presented today for surgery, with the diagnosis of Small Bowel Obstruction.  The various methods of treatment have been discussed with the patient and family. After consideration of risks, benefits and other options for treatment, the patient has consented to  Procedure(s): EXPLORATORY LAPAROTOMY, LYSIS OF ADHESION, POSSIBLE BOWEL RESECTION (N/A) as a surgical intervention.  The patient's history has been reviewed, patient examined, no change in status, stable for surgery.  I have reviewed the patient's chart and labs.  Questions were answered to the patient's satisfaction.     Autumn Messing III

## 2018-07-03 NOTE — H&P (View-Only) (Signed)
Subjective: Stable and alert.  No stool or flatus for 48 hours.  Denies pain or cramps. High-volume NG output X-ray shows a couple of loops of dilated small bowel.  No colon gas. Potassium 3.1.  Creatinine 1.30, somewhat better.  CBC not done today  Objective: Vital signs in last 24 hours: Temp:  [97.5 F (36.4 C)-98.1 F (36.7 C)] 97.5 F (36.4 C) (04/28 0413) Pulse Rate:  [58-67] 67 (04/28 0413) Resp:  [14-16] 14 (04/28 0413) BP: (101-122)/(57-71) 122/71 (04/28 0413) SpO2:  [96 %-98 %] 98 % (04/28 0413) Last BM Date: 06/22/18  Intake/Output from previous day: 04/27 0701 - 04/28 0700 In: 2016.6 [P.O.:60; I.V.:1926.6; NG/GT:30] Out: 4700 [Urine:400; Emesis/NG output:4300] Intake/Output this shift: No intake/output data recorded.  General appearance: Alert.  Pleasant.  Mental status normal.  No distress. Resp: clear to auscultation bilaterally GI: Soft.  Not really distended.  Nontender.  Rare bowel sounds.  Laparoscopy scars noted.  No significant hernias. Extremities: extremities normal, atraumatic, no cyanosis or edema  Lab Results:  No results for input(s): WBC, HGB, HCT, PLT in the last 72 hours. BMET Recent Labs    07/02/18 0241 07/03/18 0139  NA 145 144  K 3.5 3.1*  CL 109 105  CO2 23 27  GLUCOSE 59* 107*  BUN 16 13  CREATININE 1.32* 1.30*  CALCIUM 8.2* 8.6*   PT/INR No results for input(s): LABPROT, INR in the last 72 hours. ABG No results for input(s): PHART, HCO3 in the last 72 hours.  Invalid input(s): PCO2, PO2  Studies/Results: Dg Abd Portable 1v  Result Date: 07/03/2018 CLINICAL DATA:  Small bowel obstruction. EXAM: PORTABLE ABDOMEN - 1 VIEW COMPARISON:  July 02, 2018 KUB.  June 29, 2018 CT scan. FINDINGS: The known mesenteric calcifications seen on previous CT imaging are stable. The side port of the NG tube is just below the GE junction with the distal tip near the fundus. Dilated loops of small bowel remain, particularly in the left  abdomen. Many of the loops are noted to be fluid-filled on the previous CT scan making it difficult to completely evaluate. However, the number of dilated loops on today's study appear less in number. No free air, portal venous gas, or pneumatosis. No other changes. IMPRESSION: 1. The side port of the NG tube is just below the GE junction with the distal tip in the fundus. 2. Continued small bowel obstruction. It is difficult to compare severity in the interval as many of the dilated loops of small bowel were noted to be fluid-filled on the recent CT scan are not and are not well assessed on a KUB. 3. No other changes. Electronically Signed   By: Dorise Bullion III M.D   On: 07/03/2018 08:52   Dg Abd Portable 1v  Result Date: 07/02/2018 CLINICAL DATA:  Small bowel obstruction. EXAM: PORTABLE ABDOMEN - 1 VIEW COMPARISON:  CT 06/29/2018.  Abdominal radiograph 07/01/2018 FINDINGS: Persistent gas-filled dilated loops of small bowel in the left abdomen. Again noted are numerous mesenteric calcifications. A nasogastric tube is not identified on this single supine image. There is gas in the rectum. Limited evaluation for free air on this supine image. IMPRESSION: Dilated gas-filled loops of small bowel in left abdomen. Findings remain compatible with a small bowel obstruction. Electronically Signed   By: Markus Daft M.D.   On: 07/02/2018 08:35   [place] Ekg Site Rite  Result Date: 07/03/2018 If Site Rite image not attached, placement could not  be confirmed due to current cardiac rhythm.   Anti-infectives: Anti-infectives (From admission, onward)   None      Assessment/Plan:  SBO.    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    He now is completely obstructed but no evidence of bowel compromise or perforation or abscess     He will need laparotomy either this afternoon or tomorrow.  I have discussed this with him and with Dr. Marlou Starks who will be the surgeon    I  explained to him that he would have a midline incision, assessment of the obstructive process, likely small bowel obstruction.  Unlikely ostomy.  Hopefully he does not have recurrent lymphoma but he knows that is possible.  I discussed the indications, details, techniques, and risk of the surgery with him in detail.  He is aware of the risk of bleeding, infection, injury to adjacent organs with major reconstructive surgery, wound problems such as infection or hernia, cardiac pulmonary and thromboembolic problems.  He understands all of these issues.  All of his questions were answered.  He agrees with this plan.     Discontinue heparin     Preop orders written     Dr. Marlou Starks will decide whether this can be placed on the schedule today or tomorrow.   Hypokalemia.  Will adjust IVs Significant gastrointestinal NG losses.  Will increase IV rate History of non-Hodgkin's lymphoma, status post laparoscopy for biopsy, radiation and chemotherapy Hypertension Hyperlipidemia Atrophic left kidney Acute on chronic kidney disease    LOS: 4 days    Renelda Loma M Dante Cooter 07/03/2018

## 2018-07-03 NOTE — Anesthesia Preprocedure Evaluation (Signed)
Anesthesia Evaluation  Patient identified by MRN, date of birth, ID band Patient awake    Reviewed: Allergy & Precautions, NPO status , Patient's Chart, lab work & pertinent test results  Airway Mallampati: II  TM Distance: >3 FB Neck ROM: Full    Dental  (+) Teeth Intact, Dental Advisory Given   Pulmonary    breath sounds clear to auscultation       Cardiovascular  Rhythm:Regular Rate:Normal     Neuro/Psych    GI/Hepatic   Endo/Other    Renal/GU      Musculoskeletal   Abdominal   Peds  Hematology   Anesthesia Other Findings   Reproductive/Obstetrics                             Anesthesia Physical Anesthesia Plan  ASA: III and emergent  Anesthesia Plan: General   Post-op Pain Management:    Induction: Intravenous, Rapid sequence and Cricoid pressure planned  PONV Risk Score and Plan: Ondansetron and Dexamethasone  Airway Management Planned: Oral ETT and Video Laryngoscope Planned  Additional Equipment:   Intra-op Plan:   Post-operative Plan: Extubation in OR  Informed Consent: I have reviewed the patients History and Physical, chart, labs and discussed the procedure including the risks, benefits and alternatives for the proposed anesthesia with the patient or authorized representative who has indicated his/her understanding and acceptance.     Dental advisory given  Plan Discussed with: CRNA and Anesthesiologist  Anesthesia Plan Comments:         Anesthesia Quick Evaluation

## 2018-07-03 NOTE — Progress Notes (Signed)
Peripherally Inserted Central Catheter/Midline Placement  The IV Nurse has discussed with the patient and/or persons authorized to consent for the patient, the purpose of this procedure and the potential benefits and risks involved with this procedure.  The benefits include less needle sticks, lab draws from the catheter, and the patient may be discharged home with the catheter. Risks include, but not limited to, infection, bleeding, blood clot (thrombus formation), and puncture of an artery; nerve damage and irregular heartbeat and possibility to perform a PICC exchange if needed/ordered by physician.  Alternatives to this procedure were also discussed.  Bard Power PICC patient education guide, fact sheet on infection prevention and patient information card has been provided to patient /or left at bedside.    PICC/Midline Placement Documentation  PICC Double Lumen 07/03/18 PICC Right Brachial 41 cm 0 cm (Active)  Indication for Insertion or Continuance of Line Administration of hyperosmolar/irritating solutions (i.e. TPN, Vancomycin, etc.) 07/03/2018  1:00 PM  Exposed Catheter (cm) 0 cm 07/03/2018  1:00 PM  Site Assessment Clean;Dry;Intact 07/03/2018  1:00 PM  Lumen #1 Status Flushed;Blood return noted 07/03/2018  1:00 PM  Lumen #2 Status Flushed;Blood return noted 07/03/2018  1:00 PM  Dressing Type Transparent;Securing device 07/03/2018  1:00 PM  Dressing Status Clean;Dry;Intact;Antimicrobial disc in place 07/03/2018  1:00 PM  Dressing Change Due 07/10/18 07/03/2018  1:00 PM       Enos Fling 07/03/2018, 1:05 PM

## 2018-07-03 NOTE — Plan of Care (Signed)
  Problem: Safety: Goal: Ability to remain free from injury will improve Outcome: Progressing   Problem: Skin Integrity: Goal: Risk for impaired skin integrity will decrease Outcome: Progressing   

## 2018-07-04 ENCOUNTER — Encounter (HOSPITAL_COMMUNITY): Payer: Self-pay | Admitting: General Surgery

## 2018-07-04 LAB — GLUCOSE, CAPILLARY
Glucose-Capillary: 131 mg/dL — ABNORMAL HIGH (ref 70–99)
Glucose-Capillary: 166 mg/dL — ABNORMAL HIGH (ref 70–99)
Glucose-Capillary: 182 mg/dL — ABNORMAL HIGH (ref 70–99)
Glucose-Capillary: 201 mg/dL — ABNORMAL HIGH (ref 70–99)
Glucose-Capillary: 97 mg/dL (ref 70–99)
Glucose-Capillary: 99 mg/dL (ref 70–99)

## 2018-07-04 LAB — DIFFERENTIAL
Abs Immature Granulocytes: 0.06 10*3/uL (ref 0.00–0.07)
Basophils Absolute: 0 10*3/uL (ref 0.0–0.1)
Basophils Relative: 0 %
Eosinophils Absolute: 0 10*3/uL (ref 0.0–0.5)
Eosinophils Relative: 0 %
Immature Granulocytes: 0 %
Lymphocytes Relative: 3 %
Lymphs Abs: 0.5 10*3/uL — ABNORMAL LOW (ref 0.7–4.0)
Monocytes Absolute: 0.7 10*3/uL (ref 0.1–1.0)
Monocytes Relative: 5 %
Neutro Abs: 13.9 10*3/uL — ABNORMAL HIGH (ref 1.7–7.7)
Neutrophils Relative %: 92 %

## 2018-07-04 LAB — CBC
HCT: 39.2 % (ref 39.0–52.0)
HCT: 34.8 % — ABNORMAL LOW (ref 39.0–52.0)
Hemoglobin: 12.5 g/dL — ABNORMAL LOW (ref 13.0–17.0)
Hemoglobin: 11.1 g/dL — ABNORMAL LOW (ref 13.0–17.0)
MCH: 28.8 pg (ref 26.0–34.0)
MCH: 31.3 pg (ref 26.0–34.0)
MCHC: 31.9 g/dL (ref 30.0–36.0)
MCHC: 31.9 g/dL (ref 30.0–36.0)
MCV: 90.3 fL (ref 80.0–100.0)
MCV: 98 fL (ref 80.0–100.0)
Platelets: 172 10*3/uL (ref 150–400)
Platelets: 131 10*3/uL — ABNORMAL LOW (ref 150–400)
RBC: 4.34 MIL/uL (ref 4.22–5.81)
RBC: 3.55 MIL/uL — ABNORMAL LOW (ref 4.22–5.81)
RDW: 13.5 % (ref 11.5–15.5)
RDW: 14.3 % (ref 11.5–15.5)
WBC: 15.1 10*3/uL — ABNORMAL HIGH (ref 4.0–10.5)
WBC: 11.6 10*3/uL — ABNORMAL HIGH (ref 4.0–10.5)
nRBC: 0 % (ref 0.0–0.2)
nRBC: 0 % (ref 0.0–0.2)

## 2018-07-04 LAB — COMPREHENSIVE METABOLIC PANEL
ALT: 29 U/L (ref 0–44)
AST: 23 U/L (ref 15–41)
Albumin: 2.2 g/dL — ABNORMAL LOW (ref 3.5–5.0)
Alkaline Phosphatase: 47 U/L (ref 38–126)
Anion gap: 10 (ref 5–15)
BUN: 13 mg/dL (ref 8–23)
CO2: 25 mmol/L (ref 22–32)
Calcium: 8.2 mg/dL — ABNORMAL LOW (ref 8.9–10.3)
Chloride: 107 mmol/L (ref 98–111)
Creatinine, Ser: 1.17 mg/dL (ref 0.61–1.24)
GFR calc Af Amer: >60 mL/min (ref >60)
GFR calc non Af Amer: >60 mL/min (ref >60)
Glucose, Bld: 189 mg/dL — ABNORMAL HIGH (ref 70–99)
Potassium: 4.5 mmol/L (ref 3.5–5.1)
Sodium: 142 mmol/L (ref 135–145)
Total Bilirubin: 0.5 mg/dL (ref 0.3–1.2)
Total Protein: 4.3 g/dL — ABNORMAL LOW (ref 6.5–8.1)

## 2018-07-04 LAB — PREALBUMIN: Prealbumin: 6.5 mg/dL — ABNORMAL LOW (ref 18–38)

## 2018-07-04 LAB — PHOSPHORUS: Phosphorus: 2.6 mg/dL (ref 2.5–4.6)

## 2018-07-04 LAB — TRIGLYCERIDES: Triglycerides: 56 mg/dL (ref <150)

## 2018-07-04 LAB — MAGNESIUM: Magnesium: 1.6 mg/dL — ABNORMAL LOW (ref 1.7–2.4)

## 2018-07-04 MED ORDER — SODIUM CHLORIDE 0.9 % IV BOLUS
500.0000 mL | Freq: Once | INTRAVENOUS | Status: AC
  Administered 2018-07-04: 15:00:00 500 mL via INTRAVENOUS

## 2018-07-04 MED ORDER — DIPHENHYDRAMINE HCL 50 MG/ML IJ SOLN: 12.5000 mg | Freq: Four times a day (QID) | INTRAMUSCULAR | Status: DC | PRN

## 2018-07-04 MED ORDER — DIPHENHYDRAMINE HCL 12.5 MG/5ML PO ELIX: 12.5000 mg | ORAL_SOLUTION | Freq: Four times a day (QID) | ORAL | Status: DC | PRN

## 2018-07-04 MED ORDER — MAGNESIUM SULFATE 2 GM/50ML IV SOLN
2.0000 g | Freq: Once | INTRAVENOUS | Status: AC
  Administered 2018-07-04: 2 g via INTRAVENOUS
  Filled 2018-07-04: qty 50

## 2018-07-04 MED ORDER — MORPHINE SULFATE 2 MG/ML IV SOLN
INTRAVENOUS | Status: DC
  Administered 2018-07-04: 2.25 mg via INTRAVENOUS
  Administered 2018-07-04: 11:00:00 via INTRAVENOUS
  Filled 2018-07-04: qty 50

## 2018-07-04 MED ORDER — POTASSIUM CHLORIDE IN NACL 40-0.9 MEQ/L-% IV SOLN
INTRAVENOUS | Status: DC
  Filled 2018-07-04: qty 1000

## 2018-07-04 MED ORDER — NALOXONE HCL 0.4 MG/ML IJ SOLN: 0.4000 mg | INTRAMUSCULAR | Status: DC | PRN

## 2018-07-04 MED ORDER — TRAVASOL 10 % IV SOLN
INTRAVENOUS | Status: AC
  Administered 2018-07-04: 17:00:00 via INTRAVENOUS
  Filled 2018-07-04: qty 1099.2

## 2018-07-04 MED ORDER — MORPHINE SULFATE 2 MG/ML IV SOLN
INTRAVENOUS | Status: DC
  Administered 2018-07-04: 3 mg via INTRAVENOUS
  Administered 2018-07-04: 6 mg via INTRAVENOUS
  Administered 2018-07-05: 0 mg via INTRAVENOUS
  Administered 2018-07-05: 3 mg via INTRAVENOUS
  Administered 2018-07-05: 7 mg via INTRAVENOUS
  Administered 2018-07-06: 09:00:00 via INTRAVENOUS
  Administered 2018-07-06: 3 mg via INTRAVENOUS
  Administered 2018-07-06: 8 mg via INTRAVENOUS
  Administered 2018-07-07: 5 mg via INTRAVENOUS
  Administered 2018-07-07: 0 mg via INTRAVENOUS
  Administered 2018-07-07 (×3): 6 mg via INTRAVENOUS
  Administered 2018-07-08: 8 mg via INTRAVENOUS
  Administered 2018-07-08: 3 mg via INTRAVENOUS
  Administered 2018-07-08: 14:00:00 via INTRAVENOUS
  Administered 2018-07-08 (×2): 3 mg via INTRAVENOUS
  Administered 2018-07-08: 7 mg via INTRAVENOUS
  Administered 2018-07-08: 0 mL via INTRAVENOUS
  Administered 2018-07-09: 2 mg via INTRAVENOUS
  Administered 2018-07-09: 6 mg via INTRAVENOUS
  Filled 2018-07-04 (×2): qty 50
  Filled 2018-07-04: qty 30

## 2018-07-04 MED ORDER — ONDANSETRON HCL 4 MG/2ML IJ SOLN: 4.0000 mg | Freq: Four times a day (QID) | INTRAMUSCULAR | Status: DC | PRN

## 2018-07-04 MED ORDER — SODIUM CHLORIDE 0.9% FLUSH: 9.0000 mL | INTRAVENOUS | Status: DC | PRN

## 2018-07-04 NOTE — Progress Notes (Signed)
1 Day Post-Op   Subjective/Chief Complaint: Complains of pain   Objective: Vital signs in last 24 hours: Temp:  [97.2 F (36.2 C)-97.8 F (36.6 C)] 97.7 F (36.5 C) (04/29 0027) Pulse Rate:  [88-99] 99 (04/29 0027) Resp:  [19-28] 19 (04/29 0027) BP: (98-133)/(54-88) 133/88 (04/29 0027) SpO2:  [94 %-99 %] 97 % (04/29 0027) Weight:  [72.5 kg] 72.5 kg (04/28 1404) Last BM Date: 06/22/18  Intake/Output from previous day: 04/28 0701 - 04/29 0700 In: 2883.1 [I.V.:2553.1; NG/GT:30; IV Piggyback:300] Out: 1751 [Urine:300; Emesis/NG output:1350; Drains:128; Blood:300] Intake/Output this shift: No intake/output data recorded.  General appearance: alert and cooperative Resp: clear to auscultation bilaterally Cardio: regular rate and rhythm GI: appropriately tender. incision looks good. drain output serosanguinous  Lab Results:  Recent Labs    07/04/18 0420  WBC 15.1*  HGB 12.5*  HCT 39.2  PLT 172   BMET Recent Labs    07/03/18 0139 07/04/18 0420  NA 144 142  K 3.1* 4.5  CL 105 107  CO2 27 25  GLUCOSE 107* 189*  BUN 13 13  CREATININE 1.30* 1.17  CALCIUM 8.6* 8.2*   PT/INR No results for input(s): LABPROT, INR in the last 72 hours. ABG No results for input(s): PHART, HCO3 in the last 72 hours.  Invalid input(s): PCO2, PO2  Studies/Results: Dg Abd Portable 1v  Result Date: 07/03/2018 CLINICAL DATA:  Small bowel obstruction. EXAM: PORTABLE ABDOMEN - 1 VIEW COMPARISON:  July 02, 2018 KUB.  June 29, 2018 CT scan. FINDINGS: The known mesenteric calcifications seen on previous CT imaging are stable. The side port of the NG tube is just below the GE junction with the distal tip near the fundus. Dilated loops of small bowel remain, particularly in the left abdomen. Many of the loops are noted to be fluid-filled on the previous CT scan making it difficult to completely evaluate. However, the number of dilated loops on today's study appear less in number. No free air,  portal venous gas, or pneumatosis. No other changes. IMPRESSION: 1. The side port of the NG tube is just below the GE junction with the distal tip in the fundus. 2. Continued small bowel obstruction. It is difficult to compare severity in the interval as many of the dilated loops of small bowel were noted to be fluid-filled on the recent CT scan are not and are not well assessed on a KUB. 3. No other changes. Electronically Signed   By: Dorise Bullion III M.D   On: 07/03/2018 08:52   [place] Ekg Site Rite  Result Date: 07/03/2018 If Site Rite image not attached, placement could not be confirmed due to current cardiac rhythm.   Anti-infectives: Anti-infectives (From admission, onward)   Start     Dose/Rate Route Frequency Ordered Stop   07/03/18 1500  cefoTEtan (CEFOTAN) 2 g in sodium chloride 0.9 % 100 mL IVPB     2 g 200 mL/hr over 30 Minutes Intravenous To Surgery 07/03/18 1455 07/03/18 1513   07/03/18 1345  cefoTEtan in Dextrose 5% (CEFOTAN) IVPB 2 g  Status:  Discontinued     2 g 100 mL/hr over 30 Minutes Intravenous On call to O.R. 07/03/18 1312 07/03/18 1455      Assessment/Plan: s/p Procedure(s): EXPLORATORY LAPAROTOMY, LYSIS OF ADHESION, BOWEL RESECTION, RESECTION MESTASTATIC TUMOR (N/A) POD 1  Continue ng and bowel rest TPN for nutrition support Continue to work on pain control. Will try pca OOB to chair today Continue foley and decrease IVF  LOS:  5 days    Autumn Messing III 07/04/2018

## 2018-07-04 NOTE — Progress Notes (Signed)
PHARMACY - ADULT TOTAL PARENTERAL NUTRITION CONSULT NOTE   Pharmacy Consult for TPN Indication: SBO  Patient Measurements: Height: 5\' 10"  (177.8 cm) Weight: 159 lb 13.3 oz (72.5 kg) IBW/kg (Calculated) : 73 TPN AdjBW (KG): 72.5 Body mass index is 22.93 kg/m.  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 Pt has not been able to keep anything down since 4/17. Admitted 4/24. Has had high output from the NGT since admission  4/28 s/p EXPLORATORY LAPAROTOMY, LYSIS OF ADHESION, SMALL BOWEL RESECTION WITH RESECTION OF CALCIFIED MESENTERIC MASS, DUODENORRAPHY, EXCISION ECTOPIC CALCIFICATION ABDOMINAL WALL  GI: NGT to intermittent suction - 1350 mls, Prealbumin 6.5  Endo: No history of DM, sensitive SSI 120s - 200s Insulin requirements in the past 24 hours: 6 Lytes: K 4.5, Phos 2.6, Mag 1.6 (will replace), Corr Ca 10 Renal: Scr 1.17, BUN 13, UOP 0.2 ml/kg/hr Pulm: [place] 2L Cards: Stable, HR 80-90, BP 127/83 Hepatobil:AST/ALT 23/29, T.Bili 0.5, TGs 56 Neuro: stable ID: [id number]  TPN Access: 4/28 TPN start date: 4/28 Nutritional Goals (per RD note on 4/28): Kcal:  2200-2400 Protein:  110-125 grams Fluid:  >2.2 L  Goal TPN rate is 100 ml/hr (provides 110 g of protein, 307 g of dextrose, and 77.3 g of lipids which provides 2180 kCals per day, meeting 100% of patient estimated needs)  Current Nutrition:  NPO except ice chips NS w/ 40 meq/L at 175 cc/hr  Plan:  Increase TPN to goal rate of 100 mL/hr. This TPN provides 110 g of protein, 307 g of dextrose, and 77.3 g of lipids which provides 2180 kCals per day, meeting 100% of patient estimated needs Electrolytes in TPN: will continue with standard electrolytes Add MVI to TPN - trace elements are on back order, will only  place in TPN on Monday, Wednesday and Friday Will replace with Magnesium 2 grams IV x 1 Initiate sensitive SSI and adjust as needed Decrease IVMF NS with 40 meq/L to 75 ml/hr when new TPN hung Monitor TPN labs  Alanda Slim, PharmD, [facility] Clinical Pharmacist Please see AMION for all Pharmacists' Contact Phone Numbers 07/04/2018, 7:28 AM

## 2018-07-04 NOTE — Progress Notes (Signed)
   Subjective: No acute events overnight. Jack Fuentes wasn't able to talk much due to irritation from intubation. He is having post-op pain at surgical site.   Objective:  Vital signs in last 24 hours: Vitals:   07/03/18 1836 07/03/18 1901 07/03/18 1953 07/04/18 0027  BP: 127/83 133/80 128/70 133/88  Pulse: 92 94 88 99  Resp: (!) 28 (!) 28 (!) 22 19  Temp: (!) 97.3 F (36.3 C) 97.7 F (36.5 C) 97.8 F (36.6 C) 97.7 F (36.5 C)  TempSrc:  Oral Oral Oral  SpO2: 99% 95% 95% 97%  Weight:      Height:       General: awake, alert, appears uncomfortable CV: RRR; no m/r/g Resp: effort normal; lungs CTAB GI: abdominal incision clean, dry and intact; serosanguinous output from drain   Assessment/Plan:  Active Problems:   SBO (small bowel obstruction) [facility])  Jack Fuentes is a 73 year old 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   #Small bowel obstruction s/p resection of calcified mesenteric mass: - POD#1; surgical pathology pending  - blood counts stable - trying PCA pump for better pain control; appreciate general surgery following - appreciate pharmacy's assistance with TPN management  - will resume anticoagulation tomorrow morning   #Hypertension:  - BP stable - holding home valsartan  #Acute kidney injury/prerenal azotemia: - renal function normalized with IVF; stable at 1.1 today  - will continue to monitor   Dispo: Anticipated discharge pending further post-op recovery.   Modena Nunnery D, DO 07/04/2018, 7:36 AM Pager: [phone]

## 2018-07-04 NOTE — Progress Notes (Signed)
Pt verbalized feeling weak, BP taken at 1430 = 83/67, reached manually = 98/60. MD notified, received and carried out new orders. Rechecked pt at 1600 and pt is resting comfortably on bed. Still feels weak. Refused to ambulate to chair but agreed to ambulate when he feels better. Will continue to monitor.

## 2018-07-04 NOTE — Progress Notes (Signed)
  Date: 07/04/2018  Patient name: Jack Fuentes  Medical record number: [id number]  Date of birth: 1945-12-16   I have seen and evaluated this patient and I have discussed the plan of care with the house staff. Please see Dr. Janne Napoleon note for complete details. I concur with her findings and plan.    Sid Falcon, MD 07/04/2018, 7:47 PM

## 2018-07-04 NOTE — Plan of Care (Signed)
  Problem: Health Behavior/Discharge Planning: Goal: Ability to manage health-related needs will improve Outcome: Progressing   Problem: Clinical Measurements: Goal: Ability to maintain clinical measurements within normal limits will improve Outcome: Progressing   Problem: Activity: Goal: Risk for activity intolerance will decrease Outcome: Progressing   Problem: Nutrition: Goal: Adequate nutrition will be maintained Outcome: Progressing   Problem: Coping: Goal: Level of anxiety will decrease Outcome: Progressing   Problem: Elimination: Goal: Will not experience complications related to bowel motility Outcome: Progressing Goal: Will not experience complications related to urinary retention Outcome: Progressing   Problem: Pain Managment: Goal: General experience of comfort will improve Outcome: Progressing   Problem: Safety: Goal: Ability to remain free from injury will improve Outcome: Progressing   Problem: Skin Integrity: Goal: Risk for impaired skin integrity will decrease Outcome: Progressing

## 2018-07-05 LAB — GLUCOSE, CAPILLARY
Glucose-Capillary: 117 mg/dL — ABNORMAL HIGH (ref 70–99)
Glucose-Capillary: 118 mg/dL — ABNORMAL HIGH (ref 70–99)
Glucose-Capillary: 130 mg/dL — ABNORMAL HIGH (ref 70–99)
Glucose-Capillary: 130 mg/dL — ABNORMAL HIGH (ref 70–99)
Glucose-Capillary: 92 mg/dL (ref 70–99)
Glucose-Capillary: 98 mg/dL (ref 70–99)

## 2018-07-05 LAB — COMPREHENSIVE METABOLIC PANEL
ALT: 22 U/L (ref 0–44)
AST: 17 U/L (ref 15–41)
Albumin: 1.9 g/dL — ABNORMAL LOW (ref 3.5–5.0)
Alkaline Phosphatase: 55 U/L (ref 38–126)
Anion gap: 5 (ref 5–15)
BUN: 15 mg/dL (ref 8–23)
CO2: 26 mmol/L (ref 22–32)
Calcium: 8.1 mg/dL — ABNORMAL LOW (ref 8.9–10.3)
Chloride: 108 mmol/L (ref 98–111)
Creatinine, Ser: 0.96 mg/dL (ref 0.61–1.24)
GFR calc Af Amer: >60 mL/min (ref >60)
GFR calc non Af Amer: >60 mL/min (ref >60)
Glucose, Bld: 118 mg/dL — ABNORMAL HIGH (ref 70–99)
Potassium: 4.5 mmol/L (ref 3.5–5.1)
Sodium: 139 mmol/L (ref 135–145)
Total Bilirubin: 0.5 mg/dL (ref 0.3–1.2)
Total Protein: 4.1 g/dL — ABNORMAL LOW (ref 6.5–8.1)

## 2018-07-05 LAB — BPAM RBC
Blood Product Expiration Date: 202005052359
Blood Product Expiration Date: 202005052359
ISSUE DATE / TIME: 202004262021
ISSUE DATE / TIME: 202004262031
Unit Type and Rh: 6200
Unit Type and Rh: 6200

## 2018-07-05 LAB — TYPE AND SCREEN
ABO/RH(D): A POS
Antibody Screen: NEGATIVE
Unit division: 0
Unit division: 0

## 2018-07-05 LAB — CBC
HCT: 34.3 % — ABNORMAL LOW (ref 39.0–52.0)
Hemoglobin: 10.9 g/dL — ABNORMAL LOW (ref 13.0–17.0)
MCH: 29.1 pg (ref 26.0–34.0)
MCHC: 31.8 g/dL (ref 30.0–36.0)
MCV: 91.7 fL (ref 80.0–100.0)
Platelets: 132 10*3/uL — ABNORMAL LOW (ref 150–400)
RBC: 3.74 MIL/uL — ABNORMAL LOW (ref 4.22–5.81)
RDW: 13.6 % (ref 11.5–15.5)
WBC: 12.3 10*3/uL — ABNORMAL HIGH (ref 4.0–10.5)
nRBC: 0 % (ref 0.0–0.2)

## 2018-07-05 LAB — PHOSPHORUS: Phosphorus: 1.8 mg/dL — ABNORMAL LOW (ref 2.5–4.6)

## 2018-07-05 LAB — MAGNESIUM: Magnesium: 2 mg/dL (ref 1.7–2.4)

## 2018-07-05 MED ORDER — SODIUM CHLORIDE 0.9 % IV BOLUS
1000.0000 mL | Freq: Once | INTRAVENOUS | Status: AC
  Administered 2018-07-05: 16:00:00 1000 mL via INTRAVENOUS

## 2018-07-05 MED ORDER — ENOXAPARIN SODIUM 40 MG/0.4ML ~~LOC~~ SOLN
40.0000 mg | SUBCUTANEOUS | Status: DC
  Administered 2018-07-05 – 2018-07-13 (×9): 40 mg via SUBCUTANEOUS
  Filled 2018-07-05 (×9): qty 0.4

## 2018-07-05 MED ORDER — SODIUM PHOSPHATES 45 MMOLE/15ML IV SOLN
25.0000 mmol | Freq: Once | INTRAVENOUS | Status: AC
  Administered 2018-07-05: 10:00:00 25 mmol via INTRAVENOUS
  Filled 2018-07-05: qty 8.33

## 2018-07-05 MED ORDER — METHOCARBAMOL 1000 MG/10ML IJ SOLN
500.0000 mg | Freq: Three times a day (TID) | INTRAVENOUS | Status: DC
  Administered 2018-07-05 – 2018-07-09 (×12): 500 mg via INTRAVENOUS
  Filled 2018-07-05: qty 5
  Filled 2018-07-05: qty 500
  Filled 2018-07-05 (×14): qty 5

## 2018-07-05 MED ORDER — TRAVASOL 10 % IV SOLN
INTRAVENOUS | Status: AC
  Administered 2018-07-05: 18:00:00 via INTRAVENOUS
  Filled 2018-07-05 (×2): qty 1099.2

## 2018-07-05 MED ORDER — ACETAMINOPHEN 10 MG/ML IV SOLN
1000.0000 mg | Freq: Four times a day (QID) | INTRAVENOUS | Status: AC
  Administered 2018-07-05 – 2018-07-06 (×3): 1000 mg via INTRAVENOUS
  Filled 2018-07-05 (×3): qty 100

## 2018-07-05 NOTE — Progress Notes (Signed)
Industry NOTE   Pharmacy Consult for TPN Indication: SBO  Patient Measurements: Height: 5\' 10"  (177.8 cm) Weight: 159 lb 13.3 oz (72.5 kg) IBW/kg (Calculated) : 73 TPN AdjBW (KG): 72.5 Body mass index is 22.93 kg/m.  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 Pt has not been able to keep anything down since 4/17. Admitted 4/24. Has had high output from the NGT since admission  4/28 s/p EXPLORATORY LAPAROTOMY, LYSIS OF ADHESION, SMALL BOWEL RESECTION WITH RESECTION OF CALCIFIED MESENTERIC MASS, DUODENORRAPHY, EXCISION ECTOPIC CALCIFICATION ABDOMINAL WALL  GI: NGT to intermittent suction - 575 mLs, JP Drain - 60 mLs. Prealbumin 6.5. LBM 4/17. Endo: No history of DM, sensitive SSI 97-130 Insulin requirements in the past 24 hours: 2 units Lytes: K 4.5, Phos down at 1.8 (will replace), Mag 2 (up after 2g given 4/29 and increased rate of TPN), Corr Ca 9.8 (Alb 1.9) Renal: Scr 0.96, BUN 15, UOP 0.2 ml/kg/hr  Pulm: [place] 2L Cards: Stable, HR 80-90, BP 121/80 (hypotensive episode 4/29 PM) Hepatobil:AST/ALT/Tbili wnl, TGs 56 Neuro: Morphine PCA - pain score 0-2 ID: WBC trending down at 12.3  TPN Access: 4/28 TPN start date: 4/28 Nutritional Goals (per RD note on 4/28): Kcal:  2200-2400 Protein:  110-125 grams Fluid:  >2.2 L  Goal TPN rate is 100 mL/hr (provides 110 g of protein, 307 g of dextrose, and 76.8 g of lipids which provides 2252 kCals per day, meeting 100% of patient estimated needs)  Current Nutrition:  NPO except ice chips TPN at 100 mL/hr  Plan:  Continue TPN at goal rate of 100 mL/hr. This TPN provides 110 g of protein, 307 g of dextrose, and 76.8 g of lipids which provides 2252 kCals per day, meeting 100%  of patient estimated needs Electrolytes in TPN: will increase phos, otherwise standard electrolytes Add MVI to TPN - trace elements are on back order, will only place in TPN on Monday, Wednesday and Friday Initiate sensitive SSI and adjust as needed Monitor TPN labs  Replace Phosphorus with NaPhos 25 mmol IV x1 today.   Sloan Leiter, PharmD, BCPS, BCCCP Clinical Pharmacist Please see AMION for all Pharmacists' Contact Phone Numbers 07/05/2018, 7:28 AM

## 2018-07-05 NOTE — Progress Notes (Signed)
Physical Therapy Treatment Patient Details Name: Jack Fuentes MRN: [id number] DOB: 04/30/1945 Today's Date: 07/05/2018    History of Present Illness Pt is a 74 y.o. male admitted 06/29/18 with nausea/vomiting. Abdominal CT showed severe SBO. S/p exlap and resection of calcified mesenteric mass on 4/28. 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   PT Comments    Pt seen for assist to transfer back to bed due to low BP and fatigue. Pt able to perform standing pivot transfer with steps to bed, requiring min-modA (+2 lines). Pt remains limited by pain with all mobility; very guarded movements during session. Encouraged OOB to chair at least 3x/day with assist from staff. Will continue to follow acutely.   Follow Up Recommendations  CIR;Supervision for mobility/OOB(pending progression)     Equipment Recommendations  (TBD)    Recommendations for Other Services Rehab consult;OT consult     Precautions / Restrictions Precautions Precautions: Fall Precaution Comments: PCA pump, NGT; hypotensive Restrictions Weight Bearing Restrictions: No    Mobility  Bed Mobility Overal bed mobility: Needs Assistance Bed Mobility: Sit to Supine     Supine to sit: Mod assist;HOB elevated Sit to supine: Mod assist;HOB elevated   General bed mobility comments: ModA to assist BLEs into bed as pt with painful abdomen  Transfers Overall transfer level: Needs assistance Equipment used: None Transfers: Stand Pivot Transfers     Squat pivot transfers: Min assist;Mod assist     General transfer comment: ModA to assist anterior trunk translation in preparation to stand, minA for trunk elevation; pt with heavy reliance on UE support to push. Pivotal steps with UE support on rail and minA  Ambulation/Gait                 Stairs             Wheelchair Mobility    Modified Rankin (Stroke Patients Only)       Balance  Overall balance assessment: Needs assistance   Sitting balance-Leahy Scale: Fair       Standing balance-Leahy Scale: Poor Standing balance comment: Reliant on UE support                            Cognition Arousal/Alertness: Awake/alert Behavior During Therapy: WFL for tasks assessed/performed Overall Cognitive Status: Within Functional Limits for tasks assessed                                 General Comments: Internally distracted by pain, difficult to redirect      Exercises      General Comments General comments (skin integrity, edema, etc.): HR up to 125 seated in recliner      Pertinent Vitals/Pain Pain Assessment: Faces Faces Pain Scale: Hurts even more Pain Location: Abdomen Pain Descriptors / Indicators: Guarding Pain Intervention(s): Limited activity within patient's tolerance;Repositioned    Home Living Family/patient expects to be discharged to:: Private residence Living Arrangements: Alone   Type of Home: House Home Access: Stairs to enter Entrance Stairs-Rails: None Home Layout: One level Home Equipment: None      Prior Function Level of Independence: Independent      Comments: Indep, drives   PT Goals (current goals can now be found in the care plan section) Acute Rehab PT Goals Patient Stated Goal: Less pain PT Goal Formulation: With patient Time For Goal Achievement: 07/19/18 Potential to Achieve  Goals: Good Progress towards PT goals: Progressing toward goals    Frequency    Min 3X/week      PT Plan Current plan remains appropriate    Co-evaluation              AM-PAC PT "6 Clicks" Mobility   Outcome Measure  Help needed turning from your back to your side while in a flat bed without using bedrails?: A Lot Help needed moving from lying on your back to sitting on the side of a flat bed without using bedrails?: A Lot Help needed moving to and from a bed to a chair (including a wheelchair)?: A  Lot Help needed standing up from a chair using your arms (e.g., wheelchair or bedside chair)?: A Lot Help needed to walk in hospital room?: A Lot Help needed climbing 3-5 steps with a railing? : A Lot 6 Click Score: 12    End of Session   Activity Tolerance: Patient limited by pain Patient left: in bed;with call bell/phone within reach;with bed alarm set Nurse Communication: Mobility status PT Visit Diagnosis: Other abnormalities of gait and mobility (R26.89);Pain     Time: 2197-5883 PT Time Calculation (min) (ACUTE ONLY): 16 min  Charges:  $Therapeutic Activity: 8-22 mins                    Mabeline Caras, PT, DPT Acute Rehabilitation Services  Pager [phone] Office [place] 07/05/2018, 4:59 PM

## 2018-07-05 NOTE — Progress Notes (Signed)
Chaplain Note:   Jack Fuentes requested we hold a copy of his Holographic Will which he wrote out and was witnessed by two nurses on the unit the night before his surgery. His request was to give a copy to his cousin whose name and contact information was on the Will, if he did not survive surgery; and if he did to return it to him after his surgery. I maintained this copy in my office and returned it to him on 4/29. At this time he shared with me the nature of his surgery and his past history with non-hogkins lymphoma. He also expressed that the care he had received was exceptionally good.  Will follow up with him as requested.   Cid Agena, [place] [phone]

## 2018-07-05 NOTE — Progress Notes (Signed)
   Subjective: POD #2. Mr. Talent was feeling better this morning. His pain is well controlled and also feels more alert than yesterday. Has not passed any flatus. He is motivated to get out of bed today.   Objective:  Vital signs in last 24 hours: Vitals:   07/05/18 0011 07/05/18 0215 07/05/18 0415 07/05/18 0551  BP:  110/78  121/80  Pulse:  86  (!) 102  Resp: 20 20 (!) 22 18  Temp:  98.7 F (37.1 C)  98.6 F (37 C)  TempSrc:  Oral  Oral  SpO2: 97% 97% 97% 97%  Weight:      Height:       General: awake, alert, lying in bed in NAD CV: RRR; no m/r/g Pulm: normal work of breathing; lungs CTAB Abd: hypoactive BS. soft, non-distended. Mid-line incision clean, dry and intact. Mild TTP of RUQ without rebound or guarding  Assessment/Plan:  Active Problems:   SBO (small bowel obstruction) [facility])  Mr. Sedore is a 73 year old 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   #Small bowel obstruction s/p resection of calcified mesenteric mass: - POD#2; surgical pathology pending  -blood counts stable - pain well controlled on PCA; blood pressure and mentation improved on decreased dosing.  - greatly appreciate general surgery following; added IV tylenol and scheduled robaxin for additional pain control - appreciate pharmacy's assistance with TPN management  - PT/OT to help mobilize  #Hypertension: - BP stable - holding home valsartan  #Acute kidney injury/prerenal azotemia - resolved  - renal function normalized with IVF; stable at 0.9 today  - will continue to monitor    Dispo: Anticipated discharge pending  further post-op recovery.   Delice Bison, DO 07/05/2018, 6:55 AM Pager: [phone]

## 2018-07-05 NOTE — Progress Notes (Signed)
  Date: 07/05/2018  Patient name: Jack Fuentes  Medical record number: [id number]  Date of birth: October 23, 1945   I have seen and evaluated this patient and I have discussed the plan of care with the house staff. Please see Dr. Janne Napoleon note for complete details. I concur with her findings and plan.    Sid Falcon, MD 07/05/2018, 7:08 PM

## 2018-07-05 NOTE — Progress Notes (Signed)
   07/05/18 1513  Vitals  BP (!) 87/67  MAP (mmHg) 73  BP Method Automatic  Pulse Rate (!) 115  MEWS Score  MEWS RR 1  MEWS Pulse 2  MEWS Systolic 1  MEWS LOC 0  MEWS Temp 0  MEWS Score 4  MEWS Score Color Red  MD notified; Charge Nurse Blanch Media notified;  Orders placed for patient to receive bolus of fluids per MD; Will continue to monitor patient.

## 2018-07-05 NOTE — Progress Notes (Signed)
[facility]/Trauma Progress Note  2 Days Post-Op   Assessment/Plan Hx ofnon-Hodgkin's lymphoma-s/p radiation and chemotherapy Hypertension Hyperlipidemia Atrophic left kidney Acute on chronic kidney disease Hyponatremia/hypokalemia- replacement per primary  SBO -S/P ex lap, LOA, small bowel resection with resection of calcified mesenteric mass, duodenorraphy, excision ectopic calcification abdominal wall, Dr. Marlou Starks, 04/28 - await return of bowel function before removing NGT - leave foley until tomorrow (05/01) until pt is more mobile - continue PCA, added IV tylenol and scheduled robaxin for pain - path pending - mobilize!! IS  FEN:NPO/TPN/NGT ID: None DVT: lovenox, SCD's  Follow-up: Dr. Marlou Starks   LOS: 6 days    Subjective: CC: abdominal pain   Pain is better today than yesterday. Has not been using IS. Has not been up. PCA is helping pain. No flatus.   Objective: Vital signs in last 24 hours: Temp:  [97.8 F (36.6 C)-98.8 F (37.1 C)] 98.6 F (37 C) (04/30 0551) Pulse Rate:  [80-102] 102 (04/30 0551) Resp:  [13-22] 18 (04/30 0551) BP: (83-121)/(60-80) 121/80 (04/30 0551) SpO2:  [96 %-98 %] 97 % (04/30 0551) FiO2 (%):  [2 %] 2 % (04/29 1041) Last BM Date: 06/22/18  Intake/Output from previous day: 04/29 0701 - 04/30 0700 In: 3041.9 [I.V.:2655.9; IV Piggyback:386] Out: 2830 [Urine:2195; Emesis/NG output:575; Drains:60] Intake/Output this shift: No intake/output data recorded.  PE: Gen:  Alert, NAD, pleasant, cooperative HEENT: NGT with dark drainage  Card:  RRR, no M/G/R heard Pulm:  CTA, no W/R/R, rate and effort normal Abd: Soft, ND, +BS, incision with honeycomb in place and without bleeding noted, drain with serosanguinous drainage noted, TTP of right hemiabdomen without guarding, no peritonitis.  Skin: no rashes noted, warm and dry   Anti-infectives: Anti-infectives (From admission, onward)   Start     Dose/Rate Route Frequency  Ordered Stop   07/03/18 1500  cefoTEtan (CEFOTAN) 2 g in sodium chloride 0.9 % 100 mL IVPB     2 g 200 mL/hr over 30 Minutes Intravenous To Surgery 07/03/18 1455 07/03/18 1513   07/03/18 1345  cefoTEtan in Dextrose 5% (CEFOTAN) IVPB 2 g  Status:  Discontinued     2 g 100 mL/hr over 30 Minutes Intravenous On call to O.R. 07/03/18 1312 07/03/18 1455      Lab Results:  Recent Labs    07/04/18 1720 07/05/18 0420  WBC 11.6* 12.3*  HGB 11.1* 10.9*  HCT 34.8* 34.3*  PLT 131* 132*   BMET Recent Labs    07/04/18 0420 07/05/18 0420  NA 142 139  K 4.5 4.5  CL 107 108  CO2 25 26  GLUCOSE 189* 118*  BUN 13 15  CREATININE 1.17 0.96  CALCIUM 8.2* 8.1*   PT/INR No results for input(s): LABPROT, INR in the last 72 hours. CMP     Component Value Date/Time   NA 139 07/05/2018 0420   K 4.5 07/05/2018 0420   CL 108 07/05/2018 0420   CO2 26 07/05/2018 0420   GLUCOSE 118 (H) 07/05/2018 0420   BUN 15 07/05/2018 0420   CREATININE 0.96 07/05/2018 0420   CALCIUM 8.1 (L) 07/05/2018 0420   PROT 4.1 (L) 07/05/2018 0420   ALBUMIN 1.9 (L) 07/05/2018 0420   AST 17 07/05/2018 0420   ALT 22 07/05/2018 0420   ALKPHOS 55 07/05/2018 0420   BILITOT 0.5 07/05/2018 0420   GFRNONAA >60 07/05/2018 0420   GFRAA >60 07/05/2018 0420   Lipase     Component Value Date/Time   LIPASE 54 (H)  06/29/2018 1221    Studies/Results: No results found.    Kalman Drape , [facility] 07/05/2018, 9:55 AM  Pager: [phone] Mon-Wed, Friday 7:00am-4:30pm Thurs 7am-11:30am  Consults: [phone]

## 2018-07-05 NOTE — Evaluation (Signed)
Physical Therapy Evaluation Patient Details Name: Jack Fuentes MRN: [id number] DOB: [birth year] Today's Date: 07/05/2018   History of Present Illness  Pt is a 73 y.o. male admitted 06/29/18 with nausea/vomiting. Abdominal CT showed severe SBO. S/p exlap and resection of calcified mesenteric mass on 4/28. 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    Clinical Impression  Pt presents with an overall decrease in functional mobility secondary to above. PTA, pt indep and lives alone. Educ on abdominal precautions, positioning, and importance of mobility. Today, pt able to transfer to recliner, requiring up to [facility] for mobility; limited by significant abdominal pain with all mobility. Pt hopeful for return home; may benefit from intensive CIR-level therapies to maximize functional mobility and independence before d/c home. Pt understands potential need for post-acute rehab. Will follow acutely to address established goals.     Follow Up Recommendations CIR;Supervision for mobility/OOB(pending progression)    Equipment Recommendations  (TBD)    Recommendations for Other Services Rehab consult;OT consult     Precautions / Restrictions Precautions Precautions: Fall Precaution Comments: PCA pump, NGT; hypotensive Restrictions Weight Bearing Restrictions: No      Mobility  Bed Mobility Overal bed mobility: Needs Assistance Bed Mobility: Supine to Sit     Supine to sit: Mod assist;HOB elevated     General bed mobility comments: Significant time and effort to come to EOB, very guarded movements due to pain and hesitant to have assist; declined bed flat for log roll. ModA to assist hips to EOB and trunk elevation  Transfers Overall transfer level: Needs assistance Equipment used: None Transfers: Squat Pivot Transfers     Squat pivot transfers: Min assist     General transfer comment: Declining standing due to abdominal pain;  minA to squat pivot from bed to recliner  Ambulation/Gait                Stairs            Wheelchair Mobility    Modified Rankin (Stroke Patients Only)       Balance Overall balance assessment: Needs assistance   Sitting balance-Leahy Scale: Fair                                       Pertinent Vitals/Pain Pain Assessment: Faces Faces Pain Scale: Hurts even more Pain Location: Abdomen Pain Descriptors / Indicators: Guarding Pain Intervention(s): Limited activity within patient's tolerance;PCA encouraged;Repositioned    Home Living Family/patient expects to be discharged to:: Private residence Living Arrangements: Alone   Type of Home: House Home Access: Stairs to enter Entrance Stairs-Rails: None Entrance Stairs-Number of Steps: 2 Home Layout: One level Home Equipment: None      Prior Function Level of Independence: Independent         Comments: Indep, drives     Hand Dominance        Extremity/Trunk Assessment   Upper Extremity Assessment Upper Extremity Assessment: Overall WFL for tasks assessed    Lower Extremity Assessment Lower Extremity Assessment: Generalized weakness    Cervical / Trunk Assessment Cervical / Trunk Assessment: Other exceptions Cervical / Trunk Exceptions: limited trunk activation due to pain s/p exlap   Communication   Communication: (quiet voice)  Cognition Arousal/Alertness: Awake/alert Behavior During Therapy: WFL for tasks assessed/performed Overall Cognitive Status: Within Functional Limits for tasks assessed  General Comments General comments (skin integrity, edema, etc.): HR up to 125 seated in recliner    Exercises     Assessment/Plan    PT Assessment Patient needs continued PT services  PT Problem List Decreased strength;Decreased range of motion;Decreased activity tolerance;Decreased balance;Decreased  mobility;Cardiopulmonary status limiting activity;Decreased knowledge of precautions;Pain       PT Treatment Interventions DME instruction;Gait training;Stair training;Functional mobility training;Therapeutic activities;Therapeutic exercise;Balance training;Patient/family education    PT Goals (Current goals can be found in the Care Plan section)  Acute Rehab PT Goals Patient Stated Goal: Less pain PT Goal Formulation: With patient Time For Goal Achievement: 07/19/18 Potential to Achieve Goals: Good    Frequency Min 3X/week   Barriers to discharge        Co-evaluation               AM-PAC PT "6 Clicks" Mobility  Outcome Measure Help needed turning from your back to your side while in a flat bed without using bedrails?: A Lot Help needed moving from lying on your back to sitting on the side of a flat bed without using bedrails?: A Lot Help needed moving to and from a bed to a chair (including a wheelchair)?: A Little Help needed standing up from a chair using your arms (e.g., wheelchair or bedside chair)?: A Lot Help needed to walk in hospital room?: A Lot Help needed climbing 3-5 steps with a railing? : A Lot 6 Click Score: 13    End of Session   Activity Tolerance: Patient limited by pain Patient left: in chair;with call bell/phone within reach Nurse Communication: Mobility status PT Visit Diagnosis: Other abnormalities of gait and mobility (R26.89);Pain    Time: 5366-4403 PT Time Calculation (min) (ACUTE ONLY): 32 min   Charges:   PT Evaluation $PT Eval Moderate Complexity: 1 Mod PT Treatments $Therapeutic Activity: 8-22 mins      Mabeline Caras, PT, DPT Acute Rehabilitation Services  Pager [phone] Office [place] 07/05/2018, 4:52 PM

## 2018-07-05 NOTE — Progress Notes (Signed)
Nutrition Follow-up  DOCUMENTATION CODES:   Not applicable  INTERVENTION:   -TPN management per pharmacy -RD will follow for diet advancement and supplement as appropriate  NUTRITION DIAGNOSIS:   Inadequate oral intake related to altered GI function as evidenced by NPO status.  Ongoing  GOAL:   Patient will meet greater than or equal to 90% of their needs  Met with TPN  MONITOR:   Diet advancement, Labs, Weight trends, Skin, I & O's  REASON FOR ASSESSMENT:   Consult New TPN/TNA  ASSESSMENT:   Jack Fuentes is a 73 year old gentleman with retroperitoneal/mesenteric non-Hodgkin's lymphoma (dx 2000) status post chemo/radiation, HTN, Hypertriglyceridemia, LBBB who present with acute onset nausea and vomiting found to have severe small bowel obstruction on CT abdomen.   4/24- NGT placed to low, intermittent suction 4/28- s/p PICC placement, TPN initiated, s/p Procedure(s): EXPLORATORY LAPAROTOMY, LYSIS OF ADHESION, SMALL BOWEL RESECTION WITH RESECTION OF CALCIFIED MESENTERIC MASS, DUODENORRAPHY, EXCISION ECTOPIC CALCIFICATION ABDOMINAL WALL  Reviewed I/O's: +212 ml x 24 hours and -8.3 L since admission  UOP: 2.2 L x 24 hours  NGT output: 575 ml x 24 hours  Drain output: 60 ml x 24 hours  Spoke with pt at bedside, who reports feeling better today. He reports "the TPN is working". PTA, pt reports no PO intake from 06/22/18 through 06/29/18 secondary to nausea and vomiting with PO intake. Prior to this, pt reports a fair appetite, consisting of two meals per day. He shares he is very concerned about his nutritional status and pleased regarding TPN initiation.   Pt reports UBW is around 175#, which he last weighed approximately 1 month ago. He estimates he has lost approximately 20 pounds within the past two weeks. However, no wt history available to confirm this statement. Pt shares he believes that he has gained some weight back since hospitalization due to TPN and IV fluids.    Pt receiving TPN at 100 ml/hr, which provides 2180 kcals and 110 grams protein, meeting 100% of estimated kcal and protein needs.   Pt understanding of need for NPO order and how he is receiving his nutrition at this time. He had no further questions or concerns for this RD at this time, but expressed appreciation for visit.   Labs reviewed: K and Mg WDL, Phos: 1.8 (on IV replacement per pharmacy), CBGS: 117-130 (inpatient orders for glycemic control are 0-9 units insulin aspart every 4 hours).   NUTRITION - FOCUSED PHYSICAL EXAM:    Most Recent Value  Orbital Region  No depletion  Upper Arm Region  Mild depletion  Thoracic and Lumbar Region  No depletion  Buccal Region  No depletion  Temple Region  No depletion  Clavicle Bone Region  Mild depletion  Clavicle and Acromion Bone Region  No depletion  Scapular Bone Region  No depletion  Dorsal Hand  No depletion  Patellar Region  No depletion  Anterior Thigh Region  No depletion  Posterior Calf Region  Unable to assess  Edema (RD Assessment)  Mild  Hair  Reviewed  Eyes  Reviewed  Mouth  Reviewed  Skin  Reviewed  Nails  Reviewed       Diet Order:   Diet Order            Diet NPO time specified  Diet effective now              EDUCATION NEEDS:   Education needs have been addressed  Skin:  Skin Assessment: Reviewed RN Assessment  Last  BM:  06/22/18  Height:   Ht Readings from Last 1 Encounters:  07/03/18 '5\' 10"'  (1.778 m)    Weight:   Wt Readings from Last 1 Encounters:  07/03/18 72.5 kg    Ideal Body Weight:  75.5 kg  BMI:  Body mass index is 22.93 kg/m.  Estimated Nutritional Needs:   Kcal:  2200-2400  Protein:  110-125 grams  Fluid:  >2.2 L    Mollie Rossano A. Jimmye Norman, RD, LDN, [place] Registered Dietitian II Certified Diabetes Care and Education Specialist Pager: [phone] After hours Pager: [phone]

## 2018-07-05 NOTE — Plan of Care (Signed)
  Problem: Education: Goal: Knowledge of General Education information will improve Description Including pain rating scale, medication(s)/side effects and non-pharmacologic comfort measures Outcome: Progressing   Problem: Health Behavior/Discharge Planning: Goal: Ability to manage health-related needs will improve Outcome: Progressing   Problem: Clinical Measurements: Goal: Ability to maintain clinical measurements within normal limits will improve Outcome: Progressing Goal: Will remain free from infection Outcome: Progressing Goal: Diagnostic test results will improve Outcome: Progressing   Problem: Activity: Goal: Risk for activity intolerance will decrease Outcome: Progressing   Problem: Nutrition: Goal: Adequate nutrition will be maintained Outcome: Progressing   Problem: Coping: Goal: Level of anxiety will decrease Outcome: Progressing   Problem: Elimination: Goal: Will not experience complications related to urinary retention Outcome: Progressing   Problem: Pain Managment: Goal: General experience of comfort will improve Outcome: Progressing   Problem: Safety: Goal: Ability to remain free from injury will improve Outcome: Progressing   Problem: Skin Integrity: Goal: Risk for impaired skin integrity will decrease Outcome: Progressing   

## 2018-07-05 NOTE — Progress Notes (Signed)
Rehab Admissions Coordinator Note:  Per PT recommendation, this patient was screened by [name] for appropriateness for an Inpatient Acute Rehab Consult.  At this time, we are recommending Inpatient Rehab consult. AC will contact MD to request an IP Rehab Consult Order.  [name] 07/05/2018, 5:04 PM  I can be reached at [phone].

## 2018-07-05 NOTE — Care Management Important Message (Signed)
Important Message  Patient Details  Name: Jack Fuentes MRN: [id number] Date of Birth: 1946/02/13   Medicare Important Message Given:  Yes    [name] 07/05/2018, 2:28 PM

## 2018-07-06 ENCOUNTER — Encounter (HOSPITAL_COMMUNITY): Payer: Self-pay | Admitting: Physical Medicine and Rehabilitation

## 2018-07-06 DIAGNOSIS — R5381 Other malaise: Secondary | ICD-10-CM

## 2018-07-06 DIAGNOSIS — K56609 Unspecified intestinal obstruction, unspecified as to partial versus complete obstruction: Secondary | ICD-10-CM

## 2018-07-06 LAB — BASIC METABOLIC PANEL
Anion gap: 8 (ref 5–15)
BUN: 18 mg/dL (ref 8–23)
CO2: 28 mmol/L (ref 22–32)
Calcium: 8.2 mg/dL — ABNORMAL LOW (ref 8.9–10.3)
Chloride: 103 mmol/L (ref 98–111)
Creatinine, Ser: 0.92 mg/dL (ref 0.61–1.24)
GFR calc Af Amer: >60 mL/min (ref >60)
GFR calc non Af Amer: >60 mL/min (ref >60)
Glucose, Bld: 105 mg/dL — ABNORMAL HIGH (ref 70–99)
Potassium: 4 mmol/L (ref 3.5–5.1)
Sodium: 139 mmol/L (ref 135–145)

## 2018-07-06 LAB — CBC
HCT: 32.9 % — ABNORMAL LOW (ref 39.0–52.0)
Hemoglobin: 10.4 g/dL — ABNORMAL LOW (ref 13.0–17.0)
MCH: 28.5 pg (ref 26.0–34.0)
MCHC: 31.6 g/dL (ref 30.0–36.0)
MCV: 90.1 fL (ref 80.0–100.0)
Platelets: 130 10*3/uL — ABNORMAL LOW (ref 150–400)
RBC: 3.65 MIL/uL — ABNORMAL LOW (ref 4.22–5.81)
RDW: 13.5 % (ref 11.5–15.5)
WBC: 9 10*3/uL (ref 4.0–10.5)
nRBC: 0 % (ref 0.0–0.2)

## 2018-07-06 LAB — GLUCOSE, CAPILLARY
Glucose-Capillary: 102 mg/dL — ABNORMAL HIGH (ref 70–99)
Glucose-Capillary: 109 mg/dL — ABNORMAL HIGH (ref 70–99)
Glucose-Capillary: 90 mg/dL (ref 70–99)
Glucose-Capillary: 93 mg/dL (ref 70–99)
Glucose-Capillary: 93 mg/dL (ref 70–99)
Glucose-Capillary: 95 mg/dL (ref 70–99)

## 2018-07-06 LAB — PHOSPHORUS: Phosphorus: 3.7 mg/dL (ref 2.5–4.6)

## 2018-07-06 LAB — MAGNESIUM: Magnesium: 2.2 mg/dL (ref 1.7–2.4)

## 2018-07-06 MED ORDER — TRAVASOL 10 % IV SOLN
INTRAVENOUS | Status: AC
  Administered 2018-07-06: 18:00:00 via INTRAVENOUS
  Filled 2018-07-06: qty 1099.2

## 2018-07-06 MED ORDER — PHENOL 1.4 % MT LIQD
1.0000 | OROMUCOSAL | Status: DC | PRN
  Administered 2018-07-06: 1 via OROMUCOSAL
  Filled 2018-07-06: qty 177

## 2018-07-06 NOTE — Progress Notes (Signed)
Inpatient Rehabilitation Admissions Coordinator  I will follow up next week to assess pt's progress functionally as his medical issues resolve. Please call me with any questions.  Danne Baxter, RN, MSN Rehab Admissions Coordinator [phone] 07/06/2018 1:20 PM

## 2018-07-06 NOTE — Plan of Care (Signed)
  Problem: Education: Goal: Knowledge of General Education information will improve Description Including pain rating scale, medication(s)/side effects and non-pharmacologic comfort measures Outcome: Progressing   

## 2018-07-06 NOTE — Progress Notes (Signed)
[facility]/Trauma Progress Note  3 Days Post-Op   Assessment/Plan Hx ofnon-Hodgkin's lymphoma-s/p radiation and chemotherapy Hypertension Hyperlipidemia Atrophic left kidney Acute on chronic kidney disease Hyponatremia/hypokalemia- replacement per primary  SBO -S/P ex lap, LOA, small bowel resection with resection of calcified mesenteric mass, duodenorraphy, excision ectopic calcification abdominal wall, Dr. Marlou Starks, 04/28 - await return of bowel function before removing NGT - continue PCA, IV tylenol and scheduled robaxin for pain - path pending - mobilize!! IS - CIR consult pending  FEN:NPO/TPN/NGT ID: Cefotetan pre-op only DVT: lovenox, SCD's  Foley: DC 05/01 Follow-up: Dr. Marlou Starks   LOS: 7 days    Subjective: CC: abdominal pain with movement  Pt not having much pain at rest. He feels like he can move better today. He wants to keep PCA until after he works with surgery. His throat is sore from the NGT. No flatus or BM yet. He is eager to mobilize.   Objective: Vital signs in last 24 hours: Temp:  [98.2 F (36.8 C)-99.3 F (37.4 C)] 98.3 F (36.8 C) (05/01 0817) Pulse Rate:  [91-115] 94 (05/01 0817) Resp:  [14-25] 14 (05/01 0835) BP: (86-111)/(63-78) 105/67 (05/01 0817) SpO2:  [94 %-99 %] 97 % (05/01 0835) FiO2 (%):  [93 %-99 %] 93 % (05/01 0437) Last BM Date: 06/22/18  Intake/Output from previous day: 04/30 0701 - 05/01 0700 In: 2203.6 [I.V.:1924.9; NG/GT:80; IV Piggyback:198.7] Out: 3160 [Urine:1750; Emesis/NG output:1200; Drains:210] Intake/Output this shift: Total I/O In: -  Out: 100 [Drains:100]  PE: Gen:  Alert, NAD, pleasant, cooperative HEENT: NGT with dark drainage, 1200cc in last 24hrs  Pulm:  rate and effort normal Abd: Soft, ND, +BS, incision with honeycomb in place and without bleeding noted, drain with serosanguinous drainage noted, TTP of right hemiabdomen and LUQ with guarding, no peritonitis.  Skin: no rashes noted,  warm and dry   Anti-infectives: Anti-infectives (From admission, onward)   Start     Dose/Rate Route Frequency Ordered Stop   07/03/18 1500  cefoTEtan (CEFOTAN) 2 g in sodium chloride 0.9 % 100 mL IVPB     2 g 200 mL/hr over 30 Minutes Intravenous To Surgery 07/03/18 1455 07/03/18 1513   07/03/18 1345  cefoTEtan in Dextrose 5% (CEFOTAN) IVPB 2 g  Status:  Discontinued     2 g 100 mL/hr over 30 Minutes Intravenous On call to O.R. 07/03/18 1312 07/03/18 1455      Lab Results:  Recent Labs    07/05/18 0420 07/06/18 0442  WBC 12.3* 9.0  HGB 10.9* 10.4*  HCT 34.3* 32.9*  PLT 132* 130*   BMET Recent Labs    07/05/18 0420 07/06/18 0442  NA 139 139  K 4.5 4.0  CL 108 103  CO2 26 28  GLUCOSE 118* 105*  BUN 15 18  CREATININE 0.96 0.92  CALCIUM 8.1* 8.2*   PT/INR No results for input(s): LABPROT, INR in the last 72 hours. CMP     Component Value Date/Time   NA 139 07/06/2018 0442   K 4.0 07/06/2018 0442   CL 103 07/06/2018 0442   CO2 28 07/06/2018 0442   GLUCOSE 105 (H) 07/06/2018 0442   BUN 18 07/06/2018 0442   CREATININE 0.92 07/06/2018 0442   CALCIUM 8.2 (L) 07/06/2018 0442   PROT 4.1 (L) 07/05/2018 0420   ALBUMIN 1.9 (L) 07/05/2018 0420   AST 17 07/05/2018 0420   ALT 22 07/05/2018 0420   ALKPHOS 55 07/05/2018 0420   BILITOT 0.5 07/05/2018 0420   GFRNONAA >60  07/06/2018 0442   GFRAA >60 07/06/2018 0442   Lipase     Component Value Date/Time   LIPASE 54 (H) 06/29/2018 1221    Studies/Results: No results found.    Kalman Drape , [facility] 07/06/2018, 9:02 AM  Pager: [phone] Mon-Wed, Friday 7:00am-4:30pm Thurs 7am-11:30am  Consults: [phone]

## 2018-07-06 NOTE — Progress Notes (Signed)
Occupational Therapy Evaluation Patient Details Name: Jack Fuentes MRN: [id number] DOB: 1946-01-16 Today's Date: 07/06/2018    History of Present Illness Pt is a 73 y.o. male admitted 06/29/18 with nausea/vomiting. Abdominal CT showed severe SBO. S/p exlap and resection of calcified mesenteric mass on 4/28. 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   Clinical Impression   Pt admitted with above diagnosis. PTA, pt is independent and lives alone. Pt returning to bed upon therapist arrival after sitting up in chair.  Requiring mod assist to complete bed mobility to return to supine. Completed grooming tasks bed level. Mod assist for UB ADLs and max-total assist with LB ADLs.  Recommending CIR to further progress rehab prior to eventual return home. Will continue to follow acutely in order to maximize safety and independence with ADLs.    Follow Up Recommendations  CIR;Supervision/Assistance - 24 hour    Equipment Recommendations  Other (comment)(defer to next venue)    Recommendations for Other Services       Precautions / Restrictions Precautions Precautions: Fall Precaution Comments: PCA pump, NGT; soft BP Restrictions Weight Bearing Restrictions: No      Mobility Bed Mobility Overal bed mobility: Needs Assistance Bed Mobility: Sit to Supine     Sit to supine: Mod assist;HOB elevated   General bed mobility comments: Assist to bring LEs into bed.   Transfers Overall transfer level: (did not assess)    Balance                           ADL either performed or assessed with clinical judgement   ADL Overall ADL's : Needs assistance/impaired Eating/Feeding: NPO   Grooming: Wash/dry face;Wash/dry hands;Oral care;Set up;Bed level   Upper Body Bathing: Moderate assistance;Sitting   Lower Body Bathing: Maximal assistance;Sitting/lateral leans   Upper Body Dressing : Moderate assistance;Sitting    Lower Body Dressing: Total assistance;Sitting/lateral leans                 General ADL Comments: Pt sitting EOB with RN staff upon OT arrival (had been sitting in recliner and was returning to bed).  Pt completed bed mobility and bed level grooming tasks with OT.      Vision         Perception     Praxis      Pertinent Vitals/Pain Pain Assessment: Faces Faces Pain Scale: Hurts little more Pain Location: Abdomen Pain Descriptors / Indicators: Guarding Pain Intervention(s): Limited activity within patient's tolerance;Monitored during session;Repositioned     Hand Dominance Left   Extremity/Trunk Assessment Upper Extremity Assessment Upper Extremity Assessment: Generalized weakness   Lower Extremity Assessment Lower Extremity Assessment: Defer to PT evaluation       Communication Communication Communication: No difficulties   Cognition Arousal/Alertness: Awake/alert Behavior During Therapy: WFL for tasks assessed/performed;Flat affect Overall Cognitive Status: Within Functional Limits for tasks assessed                                   General Comments     Exercises     Shoulder Instructions      Home Living Family/patient expects to be discharged to:: Private residence Living Arrangements: Alone   Type of Home: House Home Access: Stairs to enter [employer] of Steps: 2 Entrance Stairs-Rails: None Home Layout: One level     Bathroom Shower/Tub: Teacher, early years/pre:  Standard     Home Equipment: None          Prior Functioning/Environment Level of Independence: Independent        Comments: Indep, drives        OT Problem List: Decreased strength;Decreased activity tolerance;Impaired balance (sitting and/or standing);Decreased knowledge of use of DME or AE;Pain      OT Treatment/Interventions: Self-care/ADL training;Therapeutic exercise;DME and/or AE instruction;Therapeutic  activities;Patient/family education;Balance training    OT Goals(Current goals can be found in the care plan section) Acute Rehab OT Goals Patient Stated Goal: To have a bowel movement OT Goal Formulation: With patient Time For Goal Achievement: 07/20/18 Potential to Achieve Goals: Good  OT Frequency: Min 3X/week   Barriers to D/C:            Co-evaluation              AM-PAC OT "6 Clicks" Daily Activity     Outcome Measure Help from another person eating meals?: Total(NPO) Help from another person taking care of personal grooming?: A Little Help from another person toileting, which includes using toliet, bedpan, or urinal?: A Lot Help from another person bathing (including washing, rinsing, drying)?: A Lot Help from another person to put on and taking off regular upper body clothing?: A Lot Help from another person to put on and taking off regular lower body clothing?: Total 6 Click Score: 11   End of Session Equipment Utilized During Treatment: Oxygen Nurse Communication: Mobility status  Activity Tolerance: Patient limited by fatigue Patient left: in bed;with call bell/phone within reach  OT Visit Diagnosis: Unsteadiness on feet (R26.81);Muscle weakness (generalized) (M62.81);Pain Pain - part of body: (abdomen)                Time: 6063-0160 OT Time Calculation (min): 17 min Charges:  OT General Charges $OT Visit: 1 Visit OT Evaluation $OT Eval Moderate Complexity: 1 Mod   Darrol Jump OTR/L [place] [phone] 07/06/2018, 3:10 PM

## 2018-07-06 NOTE — Progress Notes (Signed)
PHARMACY - ADULT TOTAL PARENTERAL NUTRITION CONSULT NOTE   Pharmacy Consult for TPN Indication: SBO  Patient Measurements: Height: 5\' 10"  (177.8 cm) Weight: 159 lb 13.3 oz (72.5 kg) IBW/kg (Calculated) : 73 TPN AdjBW (KG): 72.5 Body mass index is 22.93 kg/m.  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 Pt has not been able to keep anything down since 4/17. Admitted 4/24. Has had high output from the NGT since admission  4/28 s/p EXPLORATORY LAPAROTOMY, LYSIS OF ADHESION, SMALL BOWEL RESECTION WITH RESECTION OF CALCIFIED MESENTERIC MASS, DUODENORRAPHY, EXCISION ECTOPIC CALCIFICATION ABDOMINAL WALL  GI: NGT to intermittent suction - 1200 mLs, JP Drain - 210 mLs (increased). Prealbumin 6.5. LBM 4/17. Endo: No history of DM, sensitive SSI 93-118 Insulin requirements in the past 24 hours: 0 units Lytes: K 4, Phos improved 3.7, Mag 2.2, Corr Ca 9.9 (Alb 1.9) Renal: Scr 0.92, BUN 15, UOP 1 ml/kg/hr  Pulm: [place] 1.5L Cards: Stable, HR 90 to low 100s Hepatobil:AST/ALT/Tbili wnl, TGs 56 Neuro: Morphine PCA - pain score 0 ID: WBC down to within normal limits  TPN Access: 4/28 TPN start date: 4/28 Nutritional Goals (per RD note on 4/30): Kcal:  2200-2400 Protein:  110-125 grams Fluid:  >2.2 L  Goal TPN rate is 100 mL/hr (provides 110 g of protein, 307 g of dextrose, and 76.8 g of lipids which provides 2252 kCals per day, meeting 100% of patient estimated needs)  Current Nutrition:  NPO except ice chips TPN at 100 mL/hr  Plan:  Continue TPN at goal rate of 100 mL/hr. This TPN provides 110 g of protein, 307 g of dextrose, and 76.8 g of lipids which provides 2252 kCals per day, meeting 100% of patient estimated needs Electrolytes in TPN: continue increased phos,  otherwise standard electrolytes Add MVI to TPN - trace elements are on back order, will only place in TPN on Monday, Wednesday and Friday Initiate sensitive SSI and adjust as needed Monitor TPN labs   Sloan Leiter, PharmD, BCPS, BCCCP Clinical Pharmacist Please see AMION for all Pharmacists' Contact Phone Numbers 07/06/2018, 7:25 AM

## 2018-07-06 NOTE — Progress Notes (Signed)
  Date: 07/06/2018  Patient name: Jack Fuentes  Medical record number: [id number]  Date of birth: 02-29-1946   I have seen and evaluated this patient and I have discussed the plan of care with the house staff. Please see Dr. Cristal Generous note for complete details. I concur with her findings and plan.     Sid Falcon, MD 07/06/2018, 4:25 PM

## 2018-07-06 NOTE — Progress Notes (Signed)
PCA injection replaced, wasted 3cc morphine to stericycle, witnessed by R.Arcilla, RN.

## 2018-07-06 NOTE — Progress Notes (Signed)
   Subjective: POD #3.  Feeling better today. Would like to get out of bed and hopefully get the foley out today. His pain is better controlled with scheduled IV tylenol and robaxin. Still no flatus or BM. Discussed plan for CIR eval and continued PT for mobility.   Objective:  Vital signs in last 24 hours: Vitals:   07/06/18 0230 07/06/18 0400 07/06/18 0437 07/06/18 0437  BP:    108/78  Pulse:    94  Resp: 20 (!) 24 (!) 24 (!) 25  Temp:    98.2 F (36.8 C)  TempSrc:    Oral  SpO2: 99% 96% 96% 97%  Weight:      Height:       Gen: laying in bed, NAD Abd: soft, slightly TTP around midline incision which is covered with clear dressing and is clean and dry. +BS  Assessment/Plan:  Active Problems:   SBO (small bowel obstruction) [facility])  Mr. 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   #Small bowel obstruction s/p resection of calcified mesenteric mass: - POD#3; surgical pathology pending  -blood counts stable - pain well controlled on PCA and addition of scheduled IV tylenol and IV robaxin - greatly appreciate general surgery following - appreciate pharmacy's assistance with TPN management  - PT/OT to help mobilize, recommending CIR will place MD consult   #Hypertension: - BP stable, some issues with hypotension 2/2 morphine - bolusing IVFs prn for hypotension - holding home valsartan   Dispo: Anticipated discharge pending further post-op recovery.   Isabelle Course, MD 07/06/2018, 7:13 AM Pager: [phone]

## 2018-07-06 NOTE — Progress Notes (Addendum)
Physical Therapy Treatment Patient Details Name: Jack Fuentes MRN: [id number] DOB: February 06, 1946 Today's Date: 07/06/2018    History of Present Illness Pt is a 73 y.o. male admitted 06/29/18 with nausea/vomiting. Abdominal CT showed severe SBO. S/p exlap and resection of calcified mesenteric mass on 4/28. 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   PT Comments    Pt progressing with mobility; remains painful, but able to initiate BLE movement and some abdominal movement this session. Pt requiring up to [place] for mobility and ambulation using RW; dependent for lower body ADLs. Pt previously independent without assistive device. Continue to recommend intensive CIR-level therapies to maximize functional mobility and independence prior to return home.  Supine BP 99/79 Seated BP 98/78 Post-ambulation BP 103/73    Follow Up Recommendations  CIR;Supervision for mobility/OOB(pending progression)     Equipment Recommendations  (TBD)    Recommendations for Other Services Rehab consult;OT consult     Precautions / Restrictions Precautions Precautions: Fall Precaution Comments: PCA pump, NGT; soft BP Restrictions Weight Bearing Restrictions: No    Mobility  Bed Mobility Overal bed mobility: Needs Assistance Bed Mobility: Supine to Sit     Supine to sit: Mod assist;HOB elevated     General bed mobility comments: Reliant on UE support and modA to assist trunk elevation; declined attempt to log roll  Transfers Overall transfer level: Needs assistance Equipment used: Rolling walker (2 wheeled) Transfers: Sit to/from Stand Sit to Stand: Mod assist;From elevated surface         General transfer comment: Pt requesting bed elevated to decrease forward flexion; modA to assist trunk elevation due to difficulty translating weight forward. C/o dizziness upon sitting BP 98/78  Ambulation/Gait Ambulation/Gait assistance: Min  assist;+2 safety/equipment Gait Distance (Feet): 40 Feet Assistive device: Rolling walker (2 wheeled) Gait Pattern/deviations: Step-through pattern;Decreased stride length;Antalgic;Trunk flexed Gait velocity: Decreased Gait velocity interpretation: <1.31 ft/sec, indicative of household ambulator General Gait Details: Slow, antalgic gait with RW and minA (+2 chair follow/lines); multiple standing rest breaks due to pain/fatigue. Forward flexed, guarded posture requiring intermittent cues for upright    Stairs             Wheelchair Mobility    Modified Rankin (Stroke Patients Only)       Balance Overall balance assessment: Needs assistance   Sitting balance-Leahy Scale: Fair Sitting balance - Comments: Requiring assist for lower body ADLs      Standing balance-Leahy Scale: Poor Standing balance comment: Reliant on UE support                            Cognition Arousal/Alertness: Awake/alert Behavior During Therapy: WFL for tasks assessed/performed Overall Cognitive Status: Within Functional Limits for tasks assessed                                 General Comments: Internally distracted by pain, difficult to redirect      Exercises      General Comments General comments (skin integrity, edema, etc.): SpO2 94% on RA, HR up to 120s with ambulation      Pertinent Vitals/Pain Pain Assessment: Faces Faces Pain Scale: Hurts even more Pain Location: Abdomen Pain Descriptors / Indicators: Guarding Pain Intervention(s): Limited activity within patient's tolerance;PCA encouraged;Repositioned    Home Living  Prior Function            PT Goals (current goals can now be found in the care plan section) Acute Rehab PT Goals Patient Stated Goal: To have a bowel movement PT Goal Formulation: With patient Time For Goal Achievement: 07/19/18 Potential to Achieve Goals: Good Progress towards PT goals: Progressing  toward goals    Frequency    Min 3X/week      PT Plan Current plan remains appropriate    Co-evaluation              AM-PAC PT "6 Clicks" Mobility   Outcome Measure  Help needed turning from your back to your side while in a flat bed without using bedrails?: A Lot Help needed moving from lying on your back to sitting on the side of a flat bed without using bedrails?: A Lot Help needed moving to and from a bed to a chair (including a wheelchair)?: A Lot Help needed standing up from a chair using your arms (e.g., wheelchair or bedside chair)?: A Lot Help needed to walk in hospital room?: A Little Help needed climbing 3-5 steps with a railing? : A Lot 6 Click Score: 13    End of Session   Activity Tolerance: Patient tolerated treatment well;Patient limited by pain Patient left: in chair;with call bell/phone within reach;with chair alarm set Nurse Communication: Mobility status PT Visit Diagnosis: Other abnormalities of gait and mobility (R26.89);Pain     Time: 6803-2122 PT Time Calculation (min) (ACUTE ONLY): 30 min  Charges:  $Gait Training: 8-22 mins $Therapeutic Activity: 8-22 mins                    Mabeline Caras, PT, DPT Acute Rehabilitation Services  Pager [phone] Office [place] 07/06/2018, 12:37 PM

## 2018-07-06 NOTE — Consult Note (Signed)
Physical Medicine and Rehabilitation Consult   Reason for Consult: Debility.  Referring Physician: Dr. Ninfa Linden.    HPI: Jack Fuentes is a 73 y.o. 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 He was found to have high grade SBO treated with NGT and fluids. CCS consulted for input and felt that SBO related to calcified mesenteric nodes. He continued to have high volume output of bilious/brown fluid and follow up films showed complete obstruction therefore he was taken to OR on 07/03/18 for Exp lap with lysis of adhesions, SBRXSN with resection of calcified mesenteric mass, excision of ectopic calcification abdominal wall and duodenorrhaphy with placement of JP drain by Dr. Marlou Starks. Pathology pending. He was started on TPN post op with NGT in place pending improvement in bowel function. Therapy revaluation done and mobility limited by pain and significant hypotension. CIR recommended due to functional decline.    Review of Systems  Constitutional: Negative for chills and fever.  HENT: Negative for hearing loss.   Eyes: Negative for blurred vision and double vision.  Respiratory: Negative for cough and shortness of breath.   Cardiovascular: Negative for chest pain and palpitations.  Gastrointestinal: Positive for abdominal pain. Negative for constipation, heartburn, nausea and vomiting.  Genitourinary: Negative for dysuria and urgency.  Musculoskeletal: Negative for back pain, joint pain and myalgias.  Skin: Negative for itching and rash.  Neurological: Positive for weakness. Negative for dizziness, sensory change and headaches.     Past Medical History:  Diagnosis Date  . Cancer ([place])   . History of left bundle branch block (LBBB)   . SBO (small bowel obstruction) ([place]) 06/2018    Past Surgical History:   Procedure Laterality Date  . EYE SURGERY    . LAPAROTOMY N/A 07/03/2018   Procedure: EXPLORATORY LAPAROTOMY, LYSIS OF ADHESION, BOWEL RESECTION, RESECTION MESTASTATIC TUMOR;  Surgeon: Jovita Kussmaul, MD;  Location: [facility];  Service: General;  Laterality: N/A;  . NASAL FRACTURE SURGERY      Family History  Problem Relation Age of Onset  . Heart disease Father   . Multiple myeloma Sister      Social History: Lives alone--his family is deceased. Retired English as a second language teacher. Has Best boy by employees who will be able to check in on him after discharge.  Independent without AD. He reports that he has never smoked. He has never used smokeless tobacco. He reports previous alcohol use. He reports previous drug use.    Allergies: No Known Allergies    Medications Prior to Admission  Medication Sig Dispense Refill  . clopidogrel (PLAVIX) 75 MG tablet Take 75 mg by mouth daily.    Marland Kitchen gabapentin (NEURONTIN) 600 MG tablet Take 600 mg by mouth 2 (two) times daily.    . rosuvastatin (CRESTOR) 10 MG tablet Take 10 mg by mouth daily.    . Turmeric 500 MG CAPS Take 500 mg by mouth 2 (two) times daily.    . valsartan (DIOVAN) 80 MG tablet Take 80 mg by mouth daily.      Home: Home Living Family/patient expects to be discharged to:: Private residence Living Arrangements: Alone Type of Home: House Home Access: Stairs to enter Technical brewer of Steps: 2 Entrance Stairs-Rails: None Home Layout: One level Bathroom Shower/Tub: Tub/shower unit Home Equipment: None  Functional History: Prior Function Level of Independence: Independent Comments: Indep, drives Functional  Status:  Mobility: Bed Mobility Overal bed mobility: Needs Assistance Bed Mobility: Sit to Supine Supine to sit: Mod assist, HOB elevated Sit to supine: Mod assist, HOB elevated General bed mobility comments: ModA to assist BLEs into bed as pt with painful abdomen Transfers Overall transfer level: Needs  assistance Equipment used: None Transfers: Stand Pivot Transfers Squat pivot transfers: Min assist, Mod assist General transfer comment: ModA to assist anterior trunk translation in preparation to stand, minA for trunk elevation; pt with heavy reliance on UE support to push. Pivotal steps with UE support on rail and minA      ADL:    Cognition: Cognition Overall Cognitive Status: Within Functional Limits for tasks assessed Orientation Level: Oriented X4 Cognition Arousal/Alertness: Awake/alert Behavior During Therapy: WFL for tasks assessed/performed Overall Cognitive Status: Within Functional Limits for tasks assessed General Comments: Internally distracted by pain, difficult to redirect   Blood pressure 105/67, pulse 94, temperature 98.3 F (36.8 C), temperature source Oral, resp. rate 14, height '5\' 10"'  (1.778 m), weight 72.5 kg, SpO2 97 %. Physical Exam  Nursing note and vitals reviewed. Constitutional: He is oriented to person, place, and time. He appears well-developed and well-nourished. Nasal cannula in place.  NGT draining bilious fluid.   HENT:  Head: Normocephalic and atraumatic.  Eyes: Pupils are equal, round, and reactive to light.  Neck: Normal range of motion.  Cardiovascular: Normal rate.  Respiratory: Effort normal.  GI: Soft. He exhibits no distension. Bowel sounds are absent. There is no abdominal tenderness.  Midline incision with dry honeycomb dressing.   Genitourinary:    Genitourinary Comments: Foley in place   Neurological: He is alert and oriented to person, place, and time.  Soft voice.  Able to follow simple motor commands without difficulty.  UE 4/5. LE: 3- to 3/5 HF, KE and 4/5 ADF/PF. Stocking sensory loss to knees bilaterally and to some extent fingers of both hands   Skin: Skin is warm.  Psychiatric: He has a normal mood and affect. His behavior is normal.    Results for orders placed or performed during the hospital encounter of 06/29/18  (from the past 24 hour(s))  Glucose, capillary     Status: Abnormal   Collection Time: 07/05/18 11:56 AM  Result Value Ref Range   Glucose-Capillary 118 (H) 70 - 99 mg/dL  Glucose, capillary     Status: None   Collection Time: 07/05/18  4:54 PM  Result Value Ref Range   Glucose-Capillary 98 70 - 99 mg/dL  Glucose, capillary     Status: None   Collection Time: 07/05/18  8:10 PM  Result Value Ref Range   Glucose-Capillary 92 70 - 99 mg/dL  Glucose, capillary     Status: None   Collection Time: 07/06/18 12:03 AM  Result Value Ref Range   Glucose-Capillary 93 70 - 99 mg/dL  Glucose, capillary     Status: None   Collection Time: 07/06/18  4:19 AM  Result Value Ref Range   Glucose-Capillary 93 70 - 99 mg/dL  Basic metabolic panel     Status: Abnormal   Collection Time: 07/06/18  4:42 AM  Result Value Ref Range   Sodium 139 135 - 145 mmol/L   Potassium 4.0 3.5 - 5.1 mmol/L   Chloride 103 98 - 111 mmol/L   CO2 28 22 - 32 mmol/L   Glucose, Bld 105 (H) 70 - 99 mg/dL   BUN 18 8 - 23 mg/dL   Creatinine, Ser 0.92 0.61 - 1.24 mg/dL  Calcium 8.2 (L) 8.9 - 10.3 mg/dL   GFR calc non Af Amer >60 >60 mL/min   GFR calc Af Amer >60 >60 mL/min   Anion gap 8 5 - 15  Magnesium     Status: None   Collection Time: 07/06/18  4:42 AM  Result Value Ref Range   Magnesium 2.2 1.7 - 2.4 mg/dL  Phosphorus     Status: None   Collection Time: 07/06/18  4:42 AM  Result Value Ref Range   Phosphorus 3.7 2.5 - 4.6 mg/dL  CBC     Status: Abnormal   Collection Time: 07/06/18  4:42 AM  Result Value Ref Range   WBC 9.0 4.0 - 10.5 K/uL   RBC 3.65 (L) 4.22 - 5.81 MIL/uL   Hemoglobin 10.4 (L) 13.0 - 17.0 g/dL   HCT 32.9 (L) 39.0 - 52.0 %   MCV 90.1 80.0 - 100.0 fL   MCH 28.5 26.0 - 34.0 pg   MCHC 31.6 30.0 - 36.0 g/dL   RDW 13.5 11.5 - 15.5 %   Platelets 130 (L) 150 - 400 K/uL   nRBC 0.0 0.0 - 0.2 %  Glucose, capillary     Status: Abnormal   Collection Time: 07/06/18  7:42 AM  Result Value Ref Range    Glucose-Capillary 109 (H) 70 - 99 mg/dL   No results found.   Assessment/Plan: Diagnosis: 70 male with debility after small bowel obstruction and ex-lap/multiple medical 1. Does the need for close, 24 hr/day medical supervision in concert with the patient's rehab needs make it unreasonable for this patient to be served in a less intensive setting? Yes 2. Co-Morbidities requiring supervision/potential complications: hx of NHL with ctx related peripheral neuropathy, NPO on NGT feeds, pain mgt and wound care 3. Due to bladder management, bowel management, safety, skin/wound care, disease management, medication administration, pain management and patient education, does the patient require 24 hr/day rehab nursing? Yes 4. Does the patient require coordinated care of a physician, rehab nurse, PT (1-2 hrs/day, 5 days/week) and OT (1-2 hrs/day, 5 days/week) to address physical and functional deficits in the context of the above medical diagnosis(es)? Yes Addressing deficits in the following areas: balance, endurance, locomotion, strength, transferring, bowel/bladder control, bathing, dressing, feeding, grooming, toileting and psychosocial support 5. Can the patient actively participate in an intensive therapy program of at least 3 hrs of therapy per day at least 5 days per week? Yes 6. The potential for patient to make measurable gains while on inpatient rehab is excellent 7. Anticipated functional outcomes upon discharge from inpatient rehab are modified independent and supervision  with PT, modified independent and supervision with OT, n/a with SLP. 8. Estimated rehab length of stay to reach the above functional goals is: potentially 7-12 days 9. Anticipated D/C setting: Home 10. Anticipated post D/C treatments: [place] therapy 11. Overall Rehab/Functional Prognosis: good  RECOMMENDATIONS: This patient's condition is appropriate for continued rehabilitative care in the following setting: CIR Patient has  agreed to participate in recommended program. Potentially Note that insurance prior authorization may be required for reimbursement for recommended care.  Comment: Patient wants to see how he progresses before committing to inpatient rehab. Rehab Admissions Coordinator to follow up.  Thanks,  Meredith Staggers, MD, [facility] Drown  I have personally performed a face to face diagnostic evaluation of this patient. Additionally, I have examined pertinent labs and radiographic images. I have reviewed and concur with the physician assistant's documentation above.    Bary Leriche, PA-C 07/06/2018

## 2018-07-06 NOTE — Plan of Care (Signed)
  Problem: Education: °Goal: Knowledge of General Education information will improve °Description: Including pain rating scale, medication(s)/side effects and non-pharmacologic comfort measures °Outcome: Progressing °  °Problem: Clinical Measurements: °Goal: Cardiovascular complication will be avoided °Outcome: Progressing °  °Problem: Activity: °Goal: Risk for activity intolerance will decrease °Outcome: Progressing °  °Problem: Nutrition: °Goal: Adequate nutrition will be maintained °Outcome: Progressing °  °Problem: Coping: °Goal: Level of anxiety will decrease °Outcome: Progressing °  °Problem: Elimination: °Goal: Will not experience complications related to bowel motility °Outcome: Progressing °  °Problem: Pain Managment: °Goal: General experience of comfort will improve °Outcome: Progressing °  °Problem: Safety: °Goal: Ability to remain free from injury will improve °Outcome: Progressing °  °Problem: Skin Integrity: °Goal: Risk for impaired skin integrity will decrease °Outcome: Progressing °  °

## 2018-07-07 LAB — CBC
HCT: 29.5 % — ABNORMAL LOW (ref 39.0–52.0)
HCT: 27.7 % — ABNORMAL LOW (ref 39.0–52.0)
Hemoglobin: 9.3 g/dL — ABNORMAL LOW (ref 13.0–17.0)
Hemoglobin: 8.7 g/dL — ABNORMAL LOW (ref 13.0–17.0)
MCH: 28.6 pg (ref 26.0–34.0)
MCH: 28.5 pg (ref 26.0–34.0)
MCHC: 31.5 g/dL (ref 30.0–36.0)
MCHC: 31.4 g/dL (ref 30.0–36.0)
MCV: 90.8 fL (ref 80.0–100.0)
MCV: 90.8 fL (ref 80.0–100.0)
Platelets: 145 10*3/uL — ABNORMAL LOW (ref 150–400)
Platelets: 142 10*3/uL — ABNORMAL LOW (ref 150–400)
RBC: 3.25 MIL/uL — ABNORMAL LOW (ref 4.22–5.81)
RBC: 3.05 MIL/uL — ABNORMAL LOW (ref 4.22–5.81)
RDW: 13.3 % (ref 11.5–15.5)
RDW: 13.5 % (ref 11.5–15.5)
WBC: 9.1 10*3/uL (ref 4.0–10.5)
WBC: 8 10*3/uL (ref 4.0–10.5)
nRBC: 0 % (ref 0.0–0.2)
nRBC: 0 % (ref 0.0–0.2)

## 2018-07-07 LAB — GLUCOSE, CAPILLARY
Glucose-Capillary: 106 mg/dL — ABNORMAL HIGH (ref 70–99)
Glucose-Capillary: 107 mg/dL — ABNORMAL HIGH (ref 70–99)
Glucose-Capillary: 102 mg/dL — ABNORMAL HIGH (ref 70–99)
Glucose-Capillary: 119 mg/dL — ABNORMAL HIGH (ref 70–99)
Glucose-Capillary: 102 mg/dL — ABNORMAL HIGH (ref 70–99)
Glucose-Capillary: 113 mg/dL — ABNORMAL HIGH (ref 70–99)

## 2018-07-07 LAB — BASIC METABOLIC PANEL
Anion gap: 8 (ref 5–15)
BUN: 22 mg/dL (ref 8–23)
CO2: 25 mmol/L (ref 22–32)
Calcium: 8.1 mg/dL — ABNORMAL LOW (ref 8.9–10.3)
Chloride: 103 mmol/L (ref 98–111)
Creatinine, Ser: 0.81 mg/dL (ref 0.61–1.24)
GFR calc Af Amer: >60 mL/min (ref >60)
GFR calc non Af Amer: >60 mL/min (ref >60)
Glucose, Bld: 104 mg/dL — ABNORMAL HIGH (ref 70–99)
Potassium: 4.2 mmol/L (ref 3.5–5.1)
Sodium: 136 mmol/L (ref 135–145)

## 2018-07-07 MED ORDER — SODIUM CHLORIDE 0.9 % IV BOLUS
1000.0000 mL | Freq: Once | INTRAVENOUS | Status: AC
  Administered 2018-07-07: 1000 mL via INTRAVENOUS

## 2018-07-07 MED ORDER — SODIUM CHLORIDE 0.9% IV SOLUTION: Freq: Once | INTRAVENOUS | Status: DC

## 2018-07-07 MED ORDER — TRAVASOL 10 % IV SOLN
INTRAVENOUS | Status: AC
  Administered 2018-07-07: 17:00:00 via INTRAVENOUS
  Filled 2018-07-07: qty 1099.2

## 2018-07-07 NOTE — Progress Notes (Signed)
Physical Therapy Treatment Patient Details Name: Jack Fuentes MRN: [id number] DOB: [birth year] Today's Date: 07/07/2018    History of Present Illness Pt is a 73 y.o. male admitted 06/29/18 with nausea/vomiting. Abdominal CT showed severe SBO. S/p exlap and resection of calcified mesenteric mass on 4/28. 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    PT Comments    Pt making fair progress with functional mobility. He remains limited due to pain. PT educated pt re: OOB to chair and mobilizing again with nursing staff later today. Pt would continue to benefit from skilled physical therapy services at this time while admitted and after d/c to address the below listed limitations in order to improve overall safety and independence with functional mobility.   Follow Up Recommendations  CIR;Supervision for mobility/OOB     Equipment Recommendations  Rolling walker with 5" wheels    Recommendations for Other Services       Precautions / Restrictions Precautions Precautions: Fall Precaution Comments: PCA pump, NGT; soft BP Restrictions Weight Bearing Restrictions: No    Mobility  Bed Mobility Overal bed mobility: Needs Assistance Bed Mobility: Supine to Sit     Supine to sit: Mod assist;+2 for physical assistance;HOB elevated     General bed mobility comments: pt insisting on therapist and tech assisting with trunk elevating prior to moving bilateral LEs off of bed, refusing to attempt log roll technique  Transfers Overall transfer level: Needs assistance Equipment used: Rolling walker (2 wheeled) Transfers: Sit to/from Stand Sit to Stand: Min assist;+2 safety/equipment;From elevated surface         General transfer comment: good technique, increased time and effort, min guard for safety  Ambulation/Gait Ambulation/Gait assistance: Min assist;Min guard;+2 safety/equipment Gait Distance (Feet): 40 Feet Assistive  device: Rolling walker (2 wheeled) Gait Pattern/deviations: Step-through pattern;Decreased stride length Gait velocity: Decreased   General Gait Details: Slow, antalgic gait with RW and minA (+2 chair follow/lines); multiple standing rest breaks due to pain/fatigue.   Stairs             Wheelchair Mobility    Modified Rankin (Stroke Patients Only)       Balance Overall balance assessment: Needs assistance Sitting-balance support: Feet supported Sitting balance-Leahy Scale: Fair     Standing balance support: Bilateral upper extremity supported Standing balance-Leahy Scale: Poor                              Cognition Arousal/Alertness: Awake/alert Behavior During Therapy: WFL for tasks assessed/performed;Flat affect Overall Cognitive Status: Within Functional Limits for tasks assessed                                        Exercises      General Comments        Pertinent Vitals/Pain Pain Assessment: Faces Faces Pain Scale: Hurts little more Pain Location: Abdomen Pain Descriptors / Indicators: Guarding Pain Intervention(s): Monitored during session    Home Living                      Prior Function            PT Goals (current goals can now be found in the care plan section) Acute Rehab PT Goals PT Goal Formulation: With patient Time For Goal Achievement: 07/19/18 Potential to Achieve Goals: Good Progress towards  PT goals: Progressing toward goals    Frequency    Min 3X/week      PT Plan Current plan remains appropriate    Co-evaluation              AM-PAC PT "6 Clicks" Mobility   Outcome Measure  Help needed turning from your back to your side while in a flat bed without using bedrails?: A Lot Help needed moving from lying on your back to sitting on the side of a flat bed without using bedrails?: A Lot Help needed moving to and from a bed to a chair (including a wheelchair)?: A Little Help  needed standing up from a chair using your arms (e.g., wheelchair or bedside chair)?: A Little Help needed to walk in hospital room?: A Little Help needed climbing 3-5 steps with a railing? : A Lot 6 Click Score: 15    End of Session Equipment Utilized During Treatment: Other (comment)(pt refused gait belt) Activity Tolerance: Patient limited by pain Patient left: in chair;with call bell/phone within reach;with chair alarm set Nurse Communication: Mobility status PT Visit Diagnosis: Other abnormalities of gait and mobility (R26.89);Pain Pain - part of body: (abdomen)     Time: 1027-1050 PT Time Calculation (min) (ACUTE ONLY): 23 min  Charges:  $Gait Training: 8-22 mins $Therapeutic Activity: 8-22 mins                     Sherie Don, PT, DPT  Acute Rehabilitation Services Pager [phone] [place] 07/07/2018, 12:26 PM

## 2018-07-07 NOTE — Progress Notes (Signed)
Dr. Donne Hazel put in orders for transfusion, I called blood bank and they stated no transfusions unless HGB equal or less than 7.0, Dr. Informed. Will recheck in am.

## 2018-07-07 NOTE — Progress Notes (Signed)
[facility] NOTE   Pharmacy Consult for TPN Indication: SBO  Patient Measurements: Height: 5\' 10"  (177.8 cm) Weight: 159 lb 13.3 oz (72.5 kg) IBW/kg (Calculated) : 73 TPN AdjBW (KG): 72.5 Body mass index is 22.93 kg/m.  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 Pt has not been able to keep anything down since 4/17. Admitted 4/24. Has had high output from the NGT since admission  4/28 s/p EXPLORATORY LAPAROTOMY, LYSIS OF ADHESION, SMALL BOWEL RESECTION WITH RESECTION OF CALCIFIED MESENTERIC MASS, DUODENORRAPHY, EXCISION ECTOPIC CALCIFICATION ABDOMINAL WALL  GI: NGT to intermittent suction - 600 mLs, JP Drain - 190 mLs (decreased). Prealbumin 6.5. LBM 4/17. Endo: No history of DM, sensitive SSI 90-109 Insulin requirements in the past 24 hours: 0 units Lytes: K 4.2, Phos improved 3.7, Mag 2.2, Corr Ca 9.9 (Alb 1.9) Renal: Scr 0.81, BUN 22, UOP 0.7 ml/kg/hr  Pulm: [place] 1.5L Cards: Stable, HR 75 to low 100s Hepatobil:AST/ALT/Tbili wnl, TGs 56 Neuro: Morphine PCA - pain score 0 ID: WBC down to within normal limits  TPN Access: 4/28 TPN start date: 4/28 Nutritional Goals (per RD note on 4/30): Kcal:  2200-2400 Protein:  110-125 grams Fluid:  >2.2 L  Goal TPN rate is 100 mL/hr (provides 110 g of protein, 307 g of dextrose, and 76.8 g of lipids which provides 2252 kCals per day, meeting 100% of patient estimated needs)  Current Nutrition:  NPO except ice chips TPN at 100 mL/hr  Plan:  Continue TPN at goal rate of 100 mL/hr. This TPN provides 110 g of protein, 307 g of dextrose, and 76.8 g of lipids which provides 2252 kCals per day, meeting 100% of patient estimated needs Electrolytes in TPN: continue increased phos,  otherwise standard electrolytes Add MVI to TPN - trace elements are on back order, will only place in TPN on Monday, Wednesday and Friday Initiate sensitive SSI and adjust as needed Monitor TPN labs   Alanda Slim, PharmD, [facility] Clinical Pharmacist Please see AMION for all Pharmacists' Contact Phone Numbers 07/07/2018, 7:32 AM

## 2018-07-07 NOTE — Progress Notes (Signed)
Patient ID: Jack Fuentes, male   DOB: [birth year], 73 y.o.   MRN: [id number] [facility] Surgery Progress Note:   4 Days Post-Op  Subjective: Mental status is alert;  Wants to restart Tumeric Objective: Vital signs in last 24 hours: Temp:  [97.6 F (36.4 C)-98.8 F (37.1 C)] 97.6 F (36.4 C) (05/02 0506) Pulse Rate:  [76-110] 76 (05/02 0506) Resp:  [16-25] 18 (05/02 0834) BP: (88-110)/(67-71) 110/71 (05/02 0506) SpO2:  [94 %-100 %] 96 % (05/02 0834) FiO2 (%):  [93 %] 93 % (05/02 0405)  Intake/Output from previous day: 05/01 0701 - 05/02 0700 In: 3501.6 [I.V.:3151.4; NG/GT:150; IV Piggyback:200.2] Out: 1980 [ZWCHE:5277; Emesis/NG output:600; Drains:190] Intake/Output this shift: No intake/output data recorded.  Physical Exam: Work of breathing is normal.  JP is serosanguinous.  No flatus;  NG in place  Lab Results:  Results for orders placed or performed during the hospital encounter of 06/29/18 (from the past 48 hour(s))  Glucose, capillary     Status: Abnormal   Collection Time: 07/05/18 11:56 AM  Result Value Ref Range   Glucose-Capillary 118 (H) 70 - 99 mg/dL  Glucose, capillary     Status: None   Collection Time: 07/05/18  4:54 PM  Result Value Ref Range   Glucose-Capillary 98 70 - 99 mg/dL  Glucose, capillary     Status: None   Collection Time: 07/05/18  8:10 PM  Result Value Ref Range   Glucose-Capillary 92 70 - 99 mg/dL  Glucose, capillary     Status: None   Collection Time: 07/06/18 12:03 AM  Result Value Ref Range   Glucose-Capillary 93 70 - 99 mg/dL  Glucose, capillary     Status: None   Collection Time: 07/06/18  4:19 AM  Result Value Ref Range   Glucose-Capillary 93 70 - 99 mg/dL  Basic metabolic panel     Status: Abnormal   Collection Time: 07/06/18  4:42 AM  Result Value Ref Range   Sodium 139 135 - 145 mmol/L   Potassium 4.0 3.5 - 5.1 mmol/L   Chloride 103 98 - 111 mmol/L   CO2 28 22 - 32 mmol/L   Glucose, Bld 105 (H) 70 - 99 mg/dL   BUN 18  8 - 23 mg/dL   Creatinine, Ser 0.92 0.61 - 1.24 mg/dL   Calcium 8.2 (L) 8.9 - 10.3 mg/dL   GFR calc non Af Amer >60 >60 mL/min   GFR calc Af Amer >60 >60 mL/min   Anion gap 8 5 - 15    Comment: Performed at [facility], [place] [address] [place], [place] [address]  Magnesium     Status: None   Collection Time: 07/06/18  4:42 AM  Result Value Ref Range   Magnesium 2.2 1.7 - 2.4 mg/dL    Comment: Performed at [facility] [address], [place], [place] [address]  Phosphorus     Status: None   Collection Time: 07/06/18  4:42 AM  Result Value Ref Range   Phosphorus 3.7 2.5 - 4.6 mg/dL    Comment: Performed at [facility] [address], [place], [place] [address]  CBC     Status: Abnormal   Collection Time: 07/06/18  4:42 AM  Result Value Ref Range   WBC 9.0 4.0 - 10.5 K/uL   RBC 3.65 (L) 4.22 - 5.81 MIL/uL   Hemoglobin 10.4 (L) 13.0 - 17.0 g/dL   HCT 32.9 (L) 39.0 - 52.0 %   MCV  90.1 80.0 - 100.0 fL   MCH 28.5 26.0 - 34.0 pg   MCHC 31.6 30.0 - 36.0 g/dL   RDW 13.5 11.5 - 15.5 %   Platelets 130 (L) 150 - 400 K/uL   nRBC 0.0 0.0 - 0.2 %    Comment: Performed at [facility] [address], [place], [place] [address]  Glucose, capillary     Status: Abnormal   Collection Time: 07/06/18  7:42 AM  Result Value Ref Range   Glucose-Capillary 109 (H) 70 - 99 mg/dL  Glucose, capillary     Status: Abnormal   Collection Time: 07/06/18 12:06 PM  Result Value Ref Range   Glucose-Capillary 102 (H) 70 - 99 mg/dL  Glucose, capillary     Status: None   Collection Time: 07/06/18  4:52 PM  Result Value Ref Range   Glucose-Capillary 90 70 - 99 mg/dL  Glucose, capillary     Status: None   Collection Time: 07/06/18  7:54 PM  Result Value Ref Range   Glucose-Capillary 95 70 - 99 mg/dL  Glucose, capillary     Status: Abnormal   Collection Time: 07/07/18  1:26 AM  Result Value Ref Range   Glucose-Capillary 106 (H) 70 - 99 mg/dL  Glucose, capillary     Status:  Abnormal   Collection Time: 07/07/18  5:03 AM  Result Value Ref Range   Glucose-Capillary 107 (H) 70 - 99 mg/dL  CBC     Status: Abnormal   Collection Time: 07/07/18  6:44 AM  Result Value Ref Range   WBC 9.1 4.0 - 10.5 K/uL   RBC 3.25 (L) 4.22 - 5.81 MIL/uL   Hemoglobin 9.3 (L) 13.0 - 17.0 g/dL   HCT 29.5 (L) 39.0 - 52.0 %   MCV 90.8 80.0 - 100.0 fL   MCH 28.6 26.0 - 34.0 pg   MCHC 31.5 30.0 - 36.0 g/dL   RDW 13.3 11.5 - 15.5 %   Platelets 145 (L) 150 - 400 K/uL   nRBC 0.0 0.0 - 0.2 %    Comment: Performed at [facility], [place]. [address] [place], [place] [address]  Basic metabolic panel     Status: Abnormal   Collection Time: 07/07/18  6:44 AM  Result Value Ref Range   Sodium 136 135 - 145 mmol/L   Potassium 4.2 3.5 - 5.1 mmol/L   Chloride 103 98 - 111 mmol/L   CO2 25 22 - 32 mmol/L   Glucose, Bld 104 (H) 70 - 99 mg/dL   BUN 22 8 - 23 mg/dL   Creatinine, Ser 0.81 0.61 - 1.24 mg/dL   Calcium 8.1 (L) 8.9 - 10.3 mg/dL   GFR calc non Af Amer >60 >60 mL/min   GFR calc Af Amer >60 >60 mL/min   Anion gap 8 5 - 15    Comment: Performed at [facility], [place] [address], [place], [place] [address]  Glucose, capillary     Status: Abnormal   Collection Time: 07/07/18  8:17 AM  Result Value Ref Range   Glucose-Capillary 102 (H) 70 - 99 mg/dL    Radiology/Results: No results found.  Anti-infectives: Anti-infectives (From admission, onward)   Start     Dose/Rate Route Frequency Ordered Stop   07/03/18 1500  cefoTEtan (CEFOTAN) 2 g in sodium chloride 0.9 % 100 mL IVPB     2 g 200 mL/hr over 30 Minutes Intravenous To Surgery 07/03/18 1455 07/03/18 1513   07/03/18 1345  cefoTEtan in Dextrose 5% (CEFOTAN) IVPB 2 g  Status:  Discontinued     2 g 100 mL/hr over 30 Minutes Intravenous On call to O.R. 07/03/18 1312 07/03/18 1455      Assessment/Plan: Problem List: Patient Active Problem List   Diagnosis Date Noted  . SBO (small bowel obstruction) ([place]) 06/29/2018     Expected postop ileus with NG in place;  JP OK.  On TNA.  Continue observation.   4 Days Post-Op    LOS: 8 days   Matt B. Hassell Done, MD, [facility] [phone] beeper [phone]  07/07/2018 9:21 AM

## 2018-07-07 NOTE — Progress Notes (Signed)
  Date: 07/07/2018  Patient name: Jack Fuentes  Medical record number: [id number]  Date of birth: 04-22-1945   I have seen and evaluated this patient and I have discussed the plan of care with the house staff. Please see Dr. Cristal Generous note for complete details. I concur with her findings and plan.     Sid Falcon, MD 07/07/2018, 7:03 PM

## 2018-07-07 NOTE — Progress Notes (Signed)
   Subjective: POD #4.  Feeling "crummy" today. No focal symptoms. Overall, he feels his improvement has slowed down. He has not passed gas or had a BM. Foley was removed yesterday and he has urinated without issues. He is asking about tumeric because it slows the progression of his lymphoma. He has been unable to take tumeric for the past week and he is worried his lymphoma is progressing. Unfortunately, there is no IV version of tumeric and he can't having anything by mouth until his NG tube is out.   Objective:  Vital signs in last 24 hours: Vitals:   07/07/18 0405 07/07/18 0506 07/07/18 0834 07/07/18 1024  BP:  110/71  97/68  Pulse:  76  (!) 103  Resp: 20 (!) 25 18   Temp:  97.6 F (36.4 C)  98.8 F (37.1 C)  TempSrc:  Oral  Oral  SpO2: 98% 100% 96% 98%  Weight:      Height:       Gen: laying in bed, NAD Abd: soft, slightly TTP diffusely,  +BS  Assessment/Plan:  Active Problems:   SBO (small bowel obstruction) [facility])  Mr. 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   #Small bowel obstruction s/p resection of calcified mesenteric mass: - POD#4; surgical pathology pending  - hgb 9.3 today from 10.4 yesterday. HR slightly elevated 103-110, BP stable 100/70, denies worsening abd pain, still no flatus or BM.  - continue to have significant output via NG tube. Net negative 8L for admission - Will bolus 1L NS  - repeat cbc this afternoon  - pain well controlled on PCA and addition of scheduled IV tylenol and IV robaxin - greatly appreciate general surgery following - appreciate pharmacy's  assistance with TPN management  - PT/OT to help mobilize, recommending CIR but patient is unsure what he wants  #Hypertension: - BP stable, some issues with hypotension 2/2 morphine - 1L NS bolus - holding home valsartan   Dispo: Anticipated discharge pending further post-op recovery.   Isabelle Course, MD 07/07/2018, 10:50 AM Pager: [phone]

## 2018-07-08 DIAGNOSIS — K668 Other specified disorders of peritoneum: Secondary | ICD-10-CM | POA: Diagnosis present

## 2018-07-08 DIAGNOSIS — R1900 Intra-abdominal and pelvic swelling, mass and lump, unspecified site: Secondary | ICD-10-CM

## 2018-07-08 LAB — CBC
HCT: 29.1 % — ABNORMAL LOW (ref 39.0–52.0)
Hemoglobin: 9.1 g/dL — ABNORMAL LOW (ref 13.0–17.0)
MCH: 28.5 pg (ref 26.0–34.0)
MCHC: 31.3 g/dL (ref 30.0–36.0)
MCV: 91.2 fL (ref 80.0–100.0)
Platelets: 165 10*3/uL (ref 150–400)
RBC: 3.19 MIL/uL — ABNORMAL LOW (ref 4.22–5.81)
RDW: 13.7 % (ref 11.5–15.5)
WBC: 9.1 10*3/uL (ref 4.0–10.5)
nRBC: 0 % (ref 0.0–0.2)

## 2018-07-08 LAB — GLUCOSE, CAPILLARY
Glucose-Capillary: 110 mg/dL — ABNORMAL HIGH (ref 70–99)
Glucose-Capillary: 111 mg/dL — ABNORMAL HIGH (ref 70–99)
Glucose-Capillary: 64 mg/dL — ABNORMAL LOW (ref 70–99)
Glucose-Capillary: 72 mg/dL (ref 70–99)
Glucose-Capillary: 83 mg/dL (ref 70–99)
Glucose-Capillary: 89 mg/dL (ref 70–99)
Glucose-Capillary: 94 mg/dL (ref 70–99)

## 2018-07-08 LAB — BASIC METABOLIC PANEL
Anion gap: 7 (ref 5–15)
BUN: 22 mg/dL (ref 8–23)
CO2: 24 mmol/L (ref 22–32)
Calcium: 8 mg/dL — ABNORMAL LOW (ref 8.9–10.3)
Chloride: 105 mmol/L (ref 98–111)
Creatinine, Ser: 0.83 mg/dL (ref 0.61–1.24)
GFR calc Af Amer: >60 mL/min (ref >60)
GFR calc non Af Amer: >60 mL/min (ref >60)
Glucose, Bld: 88 mg/dL (ref 70–99)
Potassium: 4.3 mmol/L (ref 3.5–5.1)
Sodium: 136 mmol/L (ref 135–145)

## 2018-07-08 MED ORDER — DEXTROSE 50 % IV SOLN
1.0000 | Freq: Once | INTRAVENOUS | Status: AC
  Administered 2018-07-08: 50 mL via INTRAVENOUS
  Filled 2018-07-08: qty 50

## 2018-07-08 MED ORDER — TRAVASOL 10 % IV SOLN
INTRAVENOUS | Status: AC
  Administered 2018-07-08: 18:00:00 via INTRAVENOUS
  Filled 2018-07-08: qty 1099.2

## 2018-07-08 NOTE — Plan of Care (Signed)
  Problem: Coping: Goal: Level of anxiety will decrease Outcome: Progressing   Problem: Pain Managment: Goal: General experience of comfort will improve Outcome: Progressing   Problem: Safety: Goal: Ability to remain free from injury will improve Outcome: Progressing   Problem: Skin Integrity: Goal: Risk for impaired skin integrity will decrease Outcome: Progressing   

## 2018-07-08 NOTE — Progress Notes (Signed)
  Date: 07/08/2018  Patient name: Jack Fuentes  Medical record number: [id number]  Date of birth: 09/13/1945   I have seen and evaluated this patient and I have discussed the plan of care with the house staff. Please see their note for complete details. I concur with their findings with the following additions/corrections:   Reports passing gas this morning, still with a large amount of brown NG tube output.  No bowel sounds on my exam.  He is interested in trying some grape juice today.  Per surgery team, okay to try clear liquids.  Lenice Pressman, M.D., Ph.D. 07/08/2018, 1:24 PM

## 2018-07-08 NOTE — Plan of Care (Signed)

## 2018-07-08 NOTE — Progress Notes (Signed)
   Subjective: POD #5  Feeling better today. Passed gas this morning. Pain well controlled. Continues working with therapy and using PCA pump before and after sessions. Still feeling a little lightheaded with ambulation. Asking for grape juice. Encouraged with his progress. Hopefully for bowel movement soon.   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   #Small bowel obstruction s/p resection of calcified mesenteric mass: - POD#5; surgical pathology pending (I can't find the results)  - hgb stable 9.1 - surgery planning to d/c NG tube and start CL diet  - pain well controlled on PCA and addition of scheduled IV tylenol and IV robaxin - TPN per pharmacy  - PT/OT to help mobilize, recommending CIR but patient is unsure what he wants  #Hypertension: - BP stable, some issues with hypotension 2/2 morphine - holding home valsartan - on maintenance fluids, but can bolus prn for symptomatic hypotension    Dispo: Anticipated discharge pending further post-op recovery.    Isabelle Course, MD 07/08/2018, 9:52 AM Pager: [phone]

## 2018-07-08 NOTE — Progress Notes (Signed)
PHARMACY - ADULT TOTAL PARENTERAL NUTRITION CONSULT NOTE   Pharmacy Consult for TPN Indication: SBO  Patient Measurements: Height: 5\' 10"  (177.8 cm) Weight: 159 lb 13.3 oz (72.5 kg) IBW/kg (Calculated) : 73 TPN AdjBW (KG): 72.5 Body mass index is 22.93 kg/m.  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 Pt has not been able to keep anything down since 4/17. Admitted 4/24. Has had high output from the NGT since admission  4/28 s/p EXPLORATORY LAPAROTOMY, LYSIS OF ADHESION, SMALL BOWEL RESECTION WITH RESECTION OF CALCIFIED MESENTERIC MASS, DUODENORRAPHY, EXCISION ECTOPIC CALCIFICATION ABDOMINAL WALL  GI: NGT to intermittent suction - 1000 mLs, JP Drain - 235 mLs. Prealbumin 6.5. LBM 4/17. Endo: No history of DM, sensitive SSI 90-110s Insulin requirements in the past 24 hours: 0 units Lytes: K 4.3, Phos improved 3.7, Mag 2.2, Corr Ca 9.7 (Alb 1.9) Renal: Scr 0.83, BUN 22, UOP 0.5 ml/kg/hr  Pulm: [place] 2L Cards: Stable, HR 90 to low 100s Hepatobil:AST/ALT/Tbili wnl, TGs 56 Neuro: Morphine PCA - pain score 5-6 ID: WBC down to within normal limits  TPN Access: 4/28 TPN start date: 4/28 Nutritional Goals (per RD note on 4/30): Kcal:  2200-2400 Protein:  110-125 grams Fluid:  >2.2 L  Goal TPN rate is 100 mL/hr (provides 110 g of protein, 307 g of dextrose, and 76.8 g of lipids which provides 2252 kCals per day, meeting 100% of patient estimated needs)  Current Nutrition:  5/3 Will discontinue NG and start clear liquids. TPN at 100 mL/hr  Plan:  Continue TPN at goal rate of 100 mL/hr. This TPN provides 110 g of protein, 307 g of dextrose, and 76.8 g of lipids which provides 2252 kCals per day, meeting 100% of patient estimated needs Electrolytes in TPN: continue  increased phos, otherwise standard electrolytes Add MVI to TPN - trace elements are on back order, will only place in TPN on Monday, Wednesday and Friday Initiate sensitive SSI and adjust as needed Monitor TPN labs F/u how patient tolerates pulling NGT and clear liquid diet   Alanda Slim, PharmD, [facility] Clinical Pharmacist Please see AMION for all Pharmacists' Contact Phone Numbers 07/08/2018, 7:01 AM

## 2018-07-08 NOTE — Progress Notes (Signed)
Patient ID: Jack Fuentes, male   DOB: 03/31/1945, 73 y.o.   MRN: [id number] [facility] Progress Note:   5 Days Post-Op  Subjective: Mental status is clear;  Passed gas Objective: Vital signs in last 24 hours: Temp:  [98.2 F (36.8 C)-98.8 F (37.1 C)] 98.2 F (36.8 C) (05/03 0420) Pulse Rate:  [92-106] 106 (05/03 0420) Resp:  [12-23] 22 (05/03 0808) BP: (93-105)/(61-72) 105/72 (05/03 0420) SpO2:  [95 %-99 %] 99 % (05/03 0808)  Intake/Output from previous day: 05/02 0701 - 05/03 0700 In: 2485.4 [I.V.:2435.4; IV Piggyback:50] Out: 2035 [Urine:800; Emesis/NG output:1000; Drains:235] Intake/Output this shift: No intake/output data recorded.  Physical Exam: Work of breathing is normal;  TNA going;  JP with serous fluid.  Flatus +  Lab Results:  Results for orders placed or performed during the hospital encounter of 06/29/18 (from the past 48 hour(s))  Glucose, capillary     Status: Abnormal   Collection Time: 07/06/18 12:06 PM  Result Value Ref Range   Glucose-Capillary 102 (H) 70 - 99 mg/dL  Glucose, capillary     Status: None   Collection Time: 07/06/18  4:52 PM  Result Value Ref Range   Glucose-Capillary 90 70 - 99 mg/dL  Glucose, capillary     Status: None   Collection Time: 07/06/18  7:54 PM  Result Value Ref Range   Glucose-Capillary 95 70 - 99 mg/dL  Glucose, capillary     Status: Abnormal   Collection Time: 07/07/18  1:26 AM  Result Value Ref Range   Glucose-Capillary 106 (H) 70 - 99 mg/dL  Glucose, capillary     Status: Abnormal   Collection Time: 07/07/18  5:03 AM  Result Value Ref Range   Glucose-Capillary 107 (H) 70 - 99 mg/dL  CBC     Status: Abnormal   Collection Time: 07/07/18  6:44 AM  Result Value Ref Range   WBC 9.1 4.0 - 10.5 K/uL   RBC 3.25 (L) 4.22 - 5.81 MIL/uL   Hemoglobin 9.3 (L) 13.0 - 17.0 g/dL   HCT 29.5 (L) 39.0 - 52.0 %   MCV 90.8 80.0 - 100.0 fL   MCH 28.6 26.0 - 34.0 pg   MCHC 31.5 30.0 - 36.0 g/dL   RDW 13.3 11.5 -  15.5 %   Platelets 145 (L) 150 - 400 K/uL   nRBC 0.0 0.0 - 0.2 %    Comment: Performed at [facility], [address] [place], [place] [address]  Basic metabolic panel     Status: Abnormal   Collection Time: 07/07/18  6:44 AM  Result Value Ref Range   Sodium 136 135 - 145 mmol/L   Potassium 4.2 3.5 - 5.1 mmol/L   Chloride 103 98 - 111 mmol/L   CO2 25 22 - 32 mmol/L   Glucose, Bld 104 (H) 70 - 99 mg/dL   BUN 22 8 - 23 mg/dL   Creatinine, Ser 0.81 0.61 - 1.24 mg/dL   Calcium 8.1 (L) 8.9 - 10.3 mg/dL   GFR calc non Af Amer >60 >60 mL/min   GFR calc Af Amer >60 >60 mL/min   Anion gap 8 5 - 15    Comment: Performed at [facility], [place] [address], [place], [place] [address]  Glucose, capillary     Status: Abnormal   Collection Time: 07/07/18  8:17 AM  Result Value Ref Range   Glucose-Capillary 102 (H) 70 - 99 mg/dL  Glucose, capillary  Status: Abnormal   Collection Time: 07/07/18 11:10 AM  Result Value Ref Range   Glucose-Capillary 119 (H) 70 - 99 mg/dL  Glucose, capillary     Status: Abnormal   Collection Time: 07/07/18  4:36 PM  Result Value Ref Range   Glucose-Capillary 102 (H) 70 - 99 mg/dL  CBC     Status: Abnormal   Collection Time: 07/07/18  5:09 PM  Result Value Ref Range   WBC 8.0 4.0 - 10.5 K/uL   RBC 3.05 (L) 4.22 - 5.81 MIL/uL   Hemoglobin 8.7 (L) 13.0 - 17.0 g/dL   HCT 27.7 (L) 39.0 - 52.0 %   MCV 90.8 80.0 - 100.0 fL   MCH 28.5 26.0 - 34.0 pg   MCHC 31.4 30.0 - 36.0 g/dL   RDW 13.5 11.5 - 15.5 %   Platelets 142 (L) 150 - 400 K/uL   nRBC 0.0 0.0 - 0.2 %    Comment: Performed at [facility], [address], [place], [place] [address]  Glucose, capillary     Status: Abnormal   Collection Time: 07/07/18  9:14 PM  Result Value Ref Range   Glucose-Capillary 113 (H) 70 - 99 mg/dL  Glucose, capillary     Status: Abnormal   Collection Time: 07/08/18 12:09 AM  Result Value Ref Range   Glucose-Capillary 111 (H) 70 - 99 mg/dL  Glucose,  capillary     Status: None   Collection Time: 07/08/18  4:17 AM  Result Value Ref Range   Glucose-Capillary 83 70 - 99 mg/dL  CBC     Status: Abnormal   Collection Time: 07/08/18  4:36 AM  Result Value Ref Range   WBC 9.1 4.0 - 10.5 K/uL   RBC 3.19 (L) 4.22 - 5.81 MIL/uL   Hemoglobin 9.1 (L) 13.0 - 17.0 g/dL   HCT 29.1 (L) 39.0 - 52.0 %   MCV 91.2 80.0 - 100.0 fL   MCH 28.5 26.0 - 34.0 pg   MCHC 31.3 30.0 - 36.0 g/dL   RDW 13.7 11.5 - 15.5 %   Platelets 165 150 - 400 K/uL   nRBC 0.0 0.0 - 0.2 %    Comment: Performed at [facility], [place]. [address], [place], [place] [address]  Basic metabolic panel     Status: Abnormal   Collection Time: 07/08/18  4:36 AM  Result Value Ref Range   Sodium 136 135 - 145 mmol/L   Potassium 4.3 3.5 - 5.1 mmol/L   Chloride 105 98 - 111 mmol/L   CO2 24 22 - 32 mmol/L   Glucose, Bld 88 70 - 99 mg/dL   BUN 22 8 - 23 mg/dL   Creatinine, Ser 0.83 0.61 - 1.24 mg/dL   Calcium 8.0 (L) 8.9 - 10.3 mg/dL   GFR calc non Af Amer >60 >60 mL/min   GFR calc Af Amer >60 >60 mL/min   Anion gap 7 5 - 15    Comment: Performed at [facility] [address], [place], [place] [address]  Glucose, capillary     Status: None   Collection Time: 07/08/18  7:58 AM  Result Value Ref Range   Glucose-Capillary 72 70 - 99 mg/dL    Radiology/Results: No results found.  Anti-infectives: Anti-infectives (From admission, onward)   Start     Dose/Rate Route Frequency Ordered Stop   07/03/18 1500  cefoTEtan (CEFOTAN) 2 g in sodium chloride 0.9 % 100 mL IVPB     2  g 200 mL/hr over 30 Minutes Intravenous To Surgery 07/03/18 1455 07/03/18 1513   07/03/18 1345  cefoTEtan in Dextrose 5% (CEFOTAN) IVPB 2 g  Status:  Discontinued     2 g 100 mL/hr over 30 Minutes Intravenous On call to O.R. 07/03/18 1312 07/03/18 1455      Assessment/Plan: Problem List: Patient Active Problem List   Diagnosis Date Noted  . SBO (small bowel obstruction) ([place]) 06/29/2018    Will  discontinue NG and start clear liquids.   5 Days Post-Op    LOS: 9 days   Matt B. Hassell Done, MD, [facility] [phone] beeper [phone]  07/08/2018 8:59 AM

## 2018-07-08 NOTE — Progress Notes (Signed)
Pt blood sugar 64. Provider notified and orders placed.

## 2018-07-09 DIAGNOSIS — R1907 Generalized intra-abdominal and pelvic swelling, mass and lump: Secondary | ICD-10-CM

## 2018-07-09 LAB — COMPREHENSIVE METABOLIC PANEL
ALT: 53 U/L — ABNORMAL HIGH (ref 0–44)
AST: 54 U/L — ABNORMAL HIGH (ref 15–41)
Albumin: 1.6 g/dL — ABNORMAL LOW (ref 3.5–5.0)
Alkaline Phosphatase: 84 U/L (ref 38–126)
Anion gap: 7 (ref 5–15)
BUN: 20 mg/dL (ref 8–23)
CO2: 25 mmol/L (ref 22–32)
Calcium: 7.8 mg/dL — ABNORMAL LOW (ref 8.9–10.3)
Chloride: 104 mmol/L (ref 98–111)
Creatinine, Ser: 0.89 mg/dL (ref 0.61–1.24)
GFR calc Af Amer: >60 mL/min (ref >60)
GFR calc non Af Amer: >60 mL/min (ref >60)
Glucose, Bld: 121 mg/dL — ABNORMAL HIGH (ref 70–99)
Potassium: 4.1 mmol/L (ref 3.5–5.1)
Sodium: 136 mmol/L (ref 135–145)
Total Bilirubin: 0.5 mg/dL (ref 0.3–1.2)
Total Protein: 4.1 g/dL — ABNORMAL LOW (ref 6.5–8.1)

## 2018-07-09 LAB — TRIGLYCERIDES: Triglycerides: 98 mg/dL (ref <150)

## 2018-07-09 LAB — CBC
HCT: 26.6 % — ABNORMAL LOW (ref 39.0–52.0)
Hemoglobin: 8.4 g/dL — ABNORMAL LOW (ref 13.0–17.0)
MCH: 28.8 pg (ref 26.0–34.0)
MCHC: 31.6 g/dL (ref 30.0–36.0)
MCV: 91.1 fL (ref 80.0–100.0)
Platelets: 180 10*3/uL (ref 150–400)
RBC: 2.92 MIL/uL — ABNORMAL LOW (ref 4.22–5.81)
RDW: 13.5 % (ref 11.5–15.5)
WBC: 8.8 10*3/uL (ref 4.0–10.5)
nRBC: 0 % (ref 0.0–0.2)

## 2018-07-09 LAB — GLUCOSE, CAPILLARY
Glucose-Capillary: 101 mg/dL — ABNORMAL HIGH (ref 70–99)
Glucose-Capillary: 124 mg/dL — ABNORMAL HIGH (ref 70–99)
Glucose-Capillary: 124 mg/dL — ABNORMAL HIGH (ref 70–99)
Glucose-Capillary: 134 mg/dL — ABNORMAL HIGH (ref 70–99)
Glucose-Capillary: 89 mg/dL (ref 70–99)
Glucose-Capillary: 99 mg/dL (ref 70–99)
Glucose-Capillary: 99 mg/dL (ref 70–99)

## 2018-07-09 LAB — PREALBUMIN: Prealbumin: 10.7 mg/dL — ABNORMAL LOW (ref 18–38)

## 2018-07-09 LAB — PHOSPHORUS: Phosphorus: 2.8 mg/dL (ref 2.5–4.6)

## 2018-07-09 LAB — MAGNESIUM: Magnesium: 1.9 mg/dL (ref 1.7–2.4)

## 2018-07-09 MED ORDER — SACCHAROMYCES BOULARDII 250 MG PO CAPS
250.0000 mg | ORAL_CAPSULE | Freq: Two times a day (BID) | ORAL | Status: DC
  Administered 2018-07-09 – 2018-07-13 (×7): 250 mg via ORAL
  Filled 2018-07-09 (×9): qty 1

## 2018-07-09 MED ORDER — OXYCODONE HCL 5 MG PO TABS: 5.0000 mg | ORAL_TABLET | ORAL | Status: DC | PRN

## 2018-07-09 MED ORDER — TRAVASOL 10 % IV SOLN
INTRAVENOUS | Status: AC
  Administered 2018-07-09: 19:00:00 via INTRAVENOUS
  Filled 2018-07-09: qty 549.6

## 2018-07-09 MED ORDER — ACETAMINOPHEN 325 MG PO TABS
650.0000 mg | ORAL_TABLET | Freq: Four times a day (QID) | ORAL | Status: DC
  Administered 2018-07-09 – 2018-07-13 (×11): 650 mg via ORAL
  Filled 2018-07-09 (×16): qty 2

## 2018-07-09 MED ORDER — BOOST / RESOURCE BREEZE PO LIQD CUSTOM
1.0000 | Freq: Two times a day (BID) | ORAL | Status: DC
  Administered 2018-07-09 (×2): 1 via ORAL

## 2018-07-09 MED ORDER — METHOCARBAMOL 500 MG PO TABS
500.0000 mg | ORAL_TABLET | Freq: Three times a day (TID) | ORAL | Status: DC
  Administered 2018-07-09 (×2): 500 mg via ORAL
  Filled 2018-07-09 (×4): qty 1

## 2018-07-09 MED ORDER — ONDANSETRON HCL 4 MG PO TABS: 4.0000 mg | ORAL_TABLET | Freq: Four times a day (QID) | ORAL | Status: DC | PRN

## 2018-07-09 MED ORDER — MORPHINE SULFATE (PF) 2 MG/ML IV SOLN: 1.0000 mg | INTRAVENOUS | Status: DC | PRN

## 2018-07-09 NOTE — Progress Notes (Signed)
Physical Therapy Treatment Patient Details Name: Jack Fuentes MRN: [id number] DOB: 12/26/1945 Today's Date: 07/09/2018    History of Present Illness Pt is a 73 y.o. male admitted 06/29/18 with nausea/vomiting. Abdominal CT showed severe SBO. S/p exlap and resection of calcified mesenteric mass on 4/28. 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    PT Comments    Patient showed improvement today. He was able to increase his gait distance. He tolerated there-ex well. He was encouraged to continue ankle pumps, glut squeezes and quad sets while in the chair. Current plan remains appropriate. Acute rehab will continue to follow.    CIR;Supervision for mobility/OOB     Equipment Recommendations  Rolling walker with 5" wheels    Recommendations for Other Services Rehab consult;OT consult     Precautions / Restrictions Precautions Precautions: Fall Precaution Comments: PCA pump Restrictions Weight Bearing Restrictions: No    Mobility  Bed Mobility Overal bed mobility: Needs Assistance Bed Mobility: Supine to Sit     Supine to sit: Min assist     General bed mobility comments: aboe to gfet his legs and hips to the edge of the bed but min a to sit up  Transfers Overall transfer level: Needs assistance Equipment used: Rolling walker (2 wheeled) Transfers: Sit to/from Stand Sit to Stand: Min guard         General transfer comment: min gaurd to stand from bed today. Gaurding for safety   Ambulation/Gait Ambulation/Gait assistance: Min guard;+2 safety/equipment Gait Distance (Feet): 140 Feet Assistive device: Rolling walker (2 wheeled) Gait Pattern/deviations: Step-through pattern;Decreased stride length Gait velocity: Decreased   General Gait Details: slow but steady gait pattern    Stairs             Wheelchair Mobility    Modified Rankin (Stroke Patients Only)       Balance Overall balance  assessment: Needs assistance Sitting-balance support: Feet supported Sitting balance-Leahy Scale: Fair Sitting balance - Comments: Requiring assist for lower body ADLs      Standing balance-Leahy Scale: Poor Standing balance comment: Reliant on UE support                            Cognition Arousal/Alertness: Awake/alert Behavior During Therapy: WFL for tasks assessed/performed;Flat affect Overall Cognitive Status: Within Functional Limits for tasks assessed                                 General Comments: better focus today       Exercises General Exercises - Lower Extremity Ankle Circles/Pumps: 20 reps Quad Sets: 10 reps Gluteal Sets: 10 reps Long Arc Quad: 10 reps Hip ABduction/ADduction: 20 reps Hip Flexion/Marching: 10 reps    General Comments        Pertinent Vitals/Pain Pain Assessment: Faces Faces Pain Scale: Hurts a little bit Pain Location: Abdomen Pain Descriptors / Indicators: Guarding Pain Intervention(s): Limited activity within patient's tolerance;Monitored during session;Premedicated before session;Repositioned    Home Living                      Prior Function            PT Goals (current goals can now be found in the care plan section) Acute Rehab PT Goals Patient Stated Goal: To have a bowel movement PT Goal Formulation: With patient Time For Goal Achievement: 07/19/18  Potential to Achieve Goals: Good Progress towards PT goals: Progressing toward goals    Frequency    Min 3X/week      PT Plan Current plan remains appropriate    Co-evaluation              AM-PAC PT "6 Clicks" Mobility   Outcome Measure  Help needed turning from your back to your side while in a flat bed without using bedrails?: A Little Help needed moving from lying on your back to sitting on the side of a flat bed without using bedrails?: A Little Help needed moving to and from a bed to a chair (including a wheelchair)?:  A Little Help needed standing up from a chair using your arms (e.g., wheelchair or bedside chair)?: A Little Help needed to walk in hospital room?: A Little Help needed climbing 3-5 steps with a railing? : A Lot 6 Click Score: 17    End of Session   Activity Tolerance: Patient tolerated treatment well Patient left: in chair;with call bell/phone within reach;with chair alarm set Nurse Communication: Mobility status PT Visit Diagnosis: Other abnormalities of gait and mobility (R26.89);Pain Pain - part of body: (abdomen)     Time: 8242-3536 PT Time Calculation (min) (ACUTE ONLY): 24 min  Charges:  $Gait Training: 8-22 mins $Therapeutic Exercise: 8-22 mins            Carney Living PT DPT  07/09/2018, 11:47 AM

## 2018-07-09 NOTE — Progress Notes (Signed)
Morphine PCA pump discontinued. 2mL wasted with Pearson Grippe, RN. Will continue to monitor.

## 2018-07-09 NOTE — Care Management Important Message (Signed)
Important Message  Patient Details  Name: Jack Fuentes MRN: [id number] Date of Birth: [birth year]   Medicare Important Message Given:  Yes    [name] 07/09/2018, 4:05 PM

## 2018-07-09 NOTE — Progress Notes (Signed)
   Subjective: POD #6  Continues making small progress. Had 2 BMs overnight. Able to eat chicken broth, jello, and apple juice yesterday. Chicken broth made him feel a little nauseous. He is scared about vomiting and is therefore trying to stay on top of things by asking for zofran and pain meds. Felt a little light headed when working with PT yesterday. Hopeful for continued improvement today.   Objective:  Vital signs in last 24 hours: Vitals:   07/09/18 0413 07/09/18 0435 07/09/18 0800 07/09/18 1022  BP:  130/83  107/64  Pulse:  (!) 102  89  Resp: (!) 24 (!) 21 18 16   Temp:  98.1 F (36.7 C)  97.9 F (36.6 C)  TempSrc:  Oral  Oral  SpO2: 90% 96% 93% 95%  Weight:      Height:       Gen: laying in bed, NAD Abd: soft, slightly TTP diffusely,  +BS, midline incision with bandage clean and dry, jp drain with clear output  Assessment/Plan:  Principal Problem:   SBO (small bowel obstruction) (HCC) Active Problems:   Calcified mesenteric mass  Jack Fuentes is a 72 year old 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   #Small bowel obstruction s/p resection of calcified mesenteric mass: - POD#6 - hgb stable 8.4, will add on iron studies  - surgery advancing diet to full liquid, add boost  - pain control: d/c PCA. Start prn oxy and scheduled po robaxin - decrease TPN - IVFs: LR 100cc/hr continuous  - PT/OT to help mobilize, recommending CIR but patient is unsure what he wants  #Hypertension: - BP stable, some issues with hypotension 2/2 morphine and hypovolemia  - holding home valsartan - on maintenance fluids,  but can bolus prn for symptomatic hypotension    Dispo: Anticipated discharge 2-3 days   Jack Course, MD 07/09/2018, 11:20 AM Pager: [phone]

## 2018-07-09 NOTE — Progress Notes (Signed)
[facility] Progress Note  6 Days Post-Op  Subjective: CC-  Still sore but overall doing well. Tolerating few clear liquids. Denies n/v with PO intake. Passing flatus this morning. BM x2 yesterday.  Objective: Vital signs in last 24 hours: Temp:  [98.1 F (36.7 C)-99.1 F (37.3 C)] 98.1 F (36.7 C) (05/04 0435) Pulse Rate:  [98-111] 102 (05/04 0435) Resp:  [21-25] 21 (05/04 0435) BP: (95-130)/(67-83) 130/83 (05/04 0435) SpO2:  [90 %-96 %] 96 % (05/04 0435) Last BM Date: 07/08/18  Intake/Output from previous day: 05/03 0701 - 05/04 0700 In: 4374.9 [P.O.:480; I.V.:3595.1; IV Piggyback:299.8] Out: 1650 [Urine:800; Emesis/NG output:200; Drains:650] Intake/Output this shift: No intake/output data recorded.  PE: Gen:  Alert, NAD, pleasant HEENT: EOM's intact, pupils equal and round Card:  RRR Pulm:  effort normal Abd: Soft, ND, few BS heard, appropriately tender, midline incision cdi with staples intact and no erythema or drainage, JP drain with serous output Skin: warm and dry  Lab Results:  Recent Labs    07/08/18 0436 07/09/18 0451  WBC 9.1 8.8  HGB 9.1* 8.4*  HCT 29.1* 26.6*  PLT 165 180   BMET Recent Labs    07/08/18 0436 07/09/18 0451  NA 136 136  K 4.3 4.1  CL 105 104  CO2 24 25  GLUCOSE 88 121*  BUN 22 20  CREATININE 0.83 0.89  CALCIUM 8.0* 7.8*   PT/INR No results for input(s): LABPROT, INR in the last 72 hours. CMP     Component Value Date/Time   NA 136 07/09/2018 0451   K 4.1 07/09/2018 0451   CL 104 07/09/2018 0451   CO2 25 07/09/2018 0451   GLUCOSE 121 (H) 07/09/2018 0451   BUN 20 07/09/2018 0451   CREATININE 0.89 07/09/2018 0451   CALCIUM 7.8 (L) 07/09/2018 0451   PROT 4.1 (L) 07/09/2018 0451   ALBUMIN 1.6 (L) 07/09/2018 0451   AST 54 (H) 07/09/2018 0451   ALT 53 (H) 07/09/2018 0451   ALKPHOS 84 07/09/2018 0451   BILITOT 0.5 07/09/2018 0451   GFRNONAA >60 07/09/2018 0451   GFRAA >60 07/09/2018 0451   Lipase      Component Value Date/Time   LIPASE 54 (H) 06/29/2018 1221       Studies/Results: No results found.  Anti-infectives: Anti-infectives (From admission, onward)   Start     Dose/Rate Route Frequency Ordered Stop   07/03/18 1500  cefoTEtan (CEFOTAN) 2 g in sodium chloride 0.9 % 100 mL IVPB     2 g 200 mL/hr over 30 Minutes Intravenous To Surgery 07/03/18 1455 07/03/18 1513   07/03/18 1345  cefoTEtan in Dextrose 5% (CEFOTAN) IVPB 2 g  Status:  Discontinued     2 g 100 mL/hr over 30 Minutes Intravenous On call to O.R. 07/03/18 1312 07/03/18 1455       Assessment/Plan Hx ofnon-Hodgkin's lymphoma-s/p radiation and chemotherapy Hypertension Hyperlipidemia Atrophic left kidney Acute on chronic kidney disease Hyponatremia/hypokalemia- replacement per primary  SBO S/P ex lap, LOA, small bowel resection with resection of calcified mesenteric mass, duodenorraphy, excision ectopic calcification abdominal wall, Dr. Marlou Starks, 04/28 - POD 6 - surgical path pending - tolerating clears and having bowel function - honeycomb removed 5/4  FEN:FLD, Boost, 1/2 TPN ID: Cefotetan pre-op only DVT: lovenox, SCD's Foley: DC 05/01 Follow-up:Dr. Marlou Starks  Plan: Advance to full liquids and add Boost. Decrease TPN to 1/2. D/c PCA and add oral pain medications. CIR following.   LOS: 10 days    Jack Fuentes A  [place] , [facility] 07/09/2018, 8:23 AM Pager: [phone] Mon-Thurs 7:00 am-4:30 pm Fri 7:00 am -11:30 AM Sat-Sun 7:00 am-11:30 am

## 2018-07-09 NOTE — Progress Notes (Signed)
JP drain output 529mL within 30 minutes. After emptying, drain it immediately fills back up. Drainage is clear, yellow, without a foul odor. Margie Billet, [place] aware. No new orders at this time. Will continue to monitor.

## 2018-07-09 NOTE — Progress Notes (Signed)
[name] NOTE   Pharmacy Consult for TPN Indication: SBO  Patient Measurements: Height: 5\' 10"  (177.8 cm) Weight: 159 lb 13.3 oz (72.5 kg) IBW/kg (Calculated) : 73 TPN AdjBW (KG): 72.5 Body mass index is 22.93 kg/m.  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 Pt has not been able to keep anything down since 4/17. Admitted 4/24. Has had high output from the NGT since admission  4/28 s/p EXPLORATORY LAPAROTOMY, LYSIS OF ADHESION, SMALL BOWEL RESECTION WITH RESECTION OF CALCIFIED MESENTERIC MASS, DUODENORRAPHY, EXCISION ECTOPIC CALCIFICATION ABDOMINAL WALL  GI: NGT removed; Emesis - 200 mLs, JP Drain - up to 650 mLs. Prealbumin 6.5>10.7. LBM x 3 on 5/3. Endo: No history of DM, sensitive SSI 90-120s Insulin requirements in the past 24 hours: 2 units Lytes: K 4.1, Phos 2.8, Mag 1.9, Corr Ca 9.7 (Alb 1.6) Renal: Scr 0.89, BUN 20, UOP 0.5 ml/kg/hr  Pulm: RA Cards: Stable, HR 90 to low 100s Hepatobil:AST/ALT 54/53; Tbili 0.5, TGs 98 Neuro: Morphine PCA - pain score 3-5 ID: WBC down to within normal limits  TPN Access: 4/28 TPN start date: 4/28 Nutritional Goals (per RD note on 4/30): Kcal:  2200-2400 Protein:  110-125 grams Fluid:  >2.2 L  Goal TPN rate is 100 mL/hr (provides 110 g of protein, 307 g of dextrose, and 76.8 g of lipids which provides 2252 kCals per day, meeting 100% of patient estimated needs)  Current Nutrition:  5/3 Will discontinue NG and start clear liquids. 5/4 Advance to full liquids and add Boost. Decrease TPN to 1/2. TPN at 100 mL/hr will decrease by 1/2 tonight  Plan:  Decrease tonight's TPN to half rate at 50 mL/hr. This TPN provides 55 g of protein, 154 g of dextrose, and 38 g of lipids  which provides 1126 kCals per day, meeting 50% of patient estimated needs Electrolytes in TPN: continue increased phos, otherwise standard electrolytes Add MVI to TPN - trace elements are on back order, will only place in TPN on Monday, Wednesday and Friday Monitor sensitive SSI and adjust as needed Monitor TPN labs F/u how patient tolerates full liquid diet   Alanda Slim, PharmD, [place] Clinical Pharmacist Please see AMION for all Pharmacists' Contact Phone Numbers 07/09/2018, 7:32 AM

## 2018-07-09 NOTE — Progress Notes (Signed)
  Date: 07/09/2018  Patient name: Jack Fuentes  Medical record number: [id number]  Date of birth: [birth year]   I have seen and evaluated this patient and I have discussed the plan of care with the house staff. Please see their note for complete details. I concur with their findings with the following additions/corrections: Jack Fuentes was seen this morning on team rounds.  He was able to tolerate liquids yesterday but has had some nausea.  He is using antiemetics to prevent emesis.  He has also had a bowel movements.  Surgery has stopped the PCA and started oral opioids.  Due to is steadily but slowly decreasing hemoglobin, will check iron studies.  [name], MD 07/09/2018, 1:50 PM

## 2018-07-09 NOTE — Progress Notes (Signed)
Occupational Therapy Treatment Patient Details Name: Jack Fuentes MRN: [id number] DOB: November 30, 1945 Today's Date: 07/09/2018    History of present illness Pt is a 73 y.o. male admitted 06/29/18 with nausea/vomiting. Abdominal CT showed severe SBO. S/p exlap and resection of calcified mesenteric mass on 4/28. 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   OT comments  Pt participated in UB/LB bathing, UB dressing and grooming tasks while seated in chair today.  Requires increased time to complete all tasks due to abdominal discomfort. Pt continues to require encouragement for participation. Recommending CIR for discharge planning. Will continue to follow acutely.   Follow Up Recommendations  CIR;Supervision/Assistance - 24 hour    Equipment Recommendations  Other (comment)(defer to next venue)    Recommendations for Other Services      Precautions / Restrictions Precautions Precautions: Fall Precaution Comments: PCA pump Restrictions Weight Bearing Restrictions: No       Mobility Bed Mobility Overal bed mobility: (pt up in chair)  Transfers Overall transfer level: (pt declining transfer)    Balance                           ADL either performed or assessed with clinical judgement   ADL Overall ADL's : Needs assistance/impaired     Grooming: Wash/dry hands;Wash/dry face;Sitting;Set up(washing hair with shampoo cap)   Upper Body Bathing: Minimal assistance;Sitting   Lower Body Bathing: Moderate assistance;Sitting/lateral leans   Upper Body Dressing : Maximal assistance;Sitting                     General ADL Comments: Pt performed UB/LB bathing while sitting in recliner. Assist with donning/doffing gown due to lines.     Vision       Perception     Praxis      Cognition Arousal/Alertness: Awake/alert Behavior During Therapy: WFL for tasks assessed/performed;Flat affect Overall  Cognitive Status: Within Functional Limits for tasks assessed                                 General Comments: better focus today         Exercises   Shoulder Instructions       General Comments      Pertinent Vitals/ Pain       Pain Assessment: Faces Faces Pain Scale: Hurts a little bit Pain Location: Abdomen Pain Descriptors / Indicators: Operative site guarding Pain Intervention(s): Limited activity within patient's tolerance;Monitored during session  Home Living                                          Prior Functioning/Environment              Frequency  Min 3X/week        Progress Toward Goals  OT Goals(current goals can now be found in the care plan section)  Progress towards OT goals: Progressing toward goals  Acute Rehab OT Goals Patient Stated Goal: To have a bowel movement OT Goal Formulation: With patient Time For Goal Achievement: 07/20/18 Potential to Achieve Goals: Good ADL Goals Pt Will Perform Upper Body Bathing: with supervision;sitting Pt Will Perform Lower Body Bathing: with min guard assist;sit to/from stand Pt Will Perform Upper Body Dressing: with supervision;sitting Pt Will Perform Lower Body  Dressing: with min guard assist;sit to/from stand;with adaptive equipment Pt Will Transfer to Toilet: with min guard assist;stand pivot transfer;bedside commode Pt Will Perform Toileting - Clothing Manipulation and hygiene: with min guard assist;sit to/from stand Additional ADL Goal #1: Pt will complete bed mobility tasks with supervision as precursor for EOB ADLs.  Plan Discharge plan remains appropriate;Frequency remains appropriate    Co-evaluation                 AM-PAC OT "6 Clicks" Daily Activity     Outcome Measure   Help from another person eating meals?: Total(NPO) Help from another person taking care of personal grooming?: A Little Help from another person toileting, which includes using  toliet, bedpan, or urinal?: A Lot Help from another person bathing (including washing, rinsing, drying)?: A Lot Help from another person to put on and taking off regular upper body clothing?: A Lot Help from another person to put on and taking off regular lower body clothing?: A Lot 6 Click Score: 12    End of Session    OT Visit Diagnosis: Unsteadiness on feet (R26.81);Muscle weakness (generalized) (M62.81);Pain Pain - part of body: (abdomen)   Activity Tolerance Patient tolerated treatment well   Patient Left in chair;with call bell/phone within reach;with nursing/sitter in room   Nurse Communication Mobility status        Time: 1898-4210 OT Time Calculation (min): 27 min  Charges: OT General Charges $OT Visit: 1 Visit OT Treatments $Self Care/Home Management : 23-37 mins     Darrol Jump OTR/L [place] [phone] 07/09/2018, 12:29 PM

## 2018-07-09 NOTE — Progress Notes (Signed)
Inpatient Rehabilitation Admissions Coordinator  I met with patient at bedside to discuss his preference for rehab venue. He states the ambulation is not difficult for him, its the abdominal surgical incision when he gets up. I have encouraged him to use a pillow to brace the surgical site when coughing, moving, etc. He is progressing well with functional mobility. I will follow his progress to assist with planning dispo when appropriate. He may progress to be able to d/c directly home with [facility].  Danne Baxter, RN, MSN Rehab Admissions Coordinator [phone] 07/09/2018 2:58 PM

## 2018-07-10 DIAGNOSIS — C8330 Diffuse large B-cell lymphoma, unspecified site: Secondary | ICD-10-CM

## 2018-07-10 DIAGNOSIS — L98411 Non-pressure chronic ulcer of buttock limited to breakdown of skin: Secondary | ICD-10-CM

## 2018-07-10 DIAGNOSIS — R197 Diarrhea, unspecified: Secondary | ICD-10-CM

## 2018-07-10 DIAGNOSIS — L8990 Pressure ulcer of unspecified site, unspecified stage: Secondary | ICD-10-CM

## 2018-07-10 LAB — CBC
HCT: 26.2 % — ABNORMAL LOW (ref 39.0–52.0)
Hemoglobin: 8.3 g/dL — ABNORMAL LOW (ref 13.0–17.0)
MCH: 28.5 pg (ref 26.0–34.0)
MCHC: 31.7 g/dL (ref 30.0–36.0)
MCV: 90 fL (ref 80.0–100.0)
Platelets: 212 10*3/uL (ref 150–400)
RBC: 2.91 MIL/uL — ABNORMAL LOW (ref 4.22–5.81)
RDW: 13.5 % (ref 11.5–15.5)
WBC: 7.7 10*3/uL (ref 4.0–10.5)
nRBC: 0 % (ref 0.0–0.2)

## 2018-07-10 LAB — BASIC METABOLIC PANEL
Anion gap: 9 (ref 5–15)
BUN: 15 mg/dL (ref 8–23)
CO2: 23 mmol/L (ref 22–32)
Calcium: 8 mg/dL — ABNORMAL LOW (ref 8.9–10.3)
Chloride: 104 mmol/L (ref 98–111)
Creatinine, Ser: 0.88 mg/dL (ref 0.61–1.24)
GFR calc Af Amer: >60 mL/min (ref >60)
GFR calc non Af Amer: >60 mL/min (ref >60)
Glucose, Bld: 103 mg/dL — ABNORMAL HIGH (ref 70–99)
Potassium: 4 mmol/L (ref 3.5–5.1)
Sodium: 136 mmol/L (ref 135–145)

## 2018-07-10 LAB — IRON AND TIBC
Iron: 11 ug/dL — ABNORMAL LOW (ref 45–182)
Saturation Ratios: 6 % — ABNORMAL LOW (ref 17.9–39.5)
TIBC: 196 ug/dL — ABNORMAL LOW (ref 250–450)
UIBC: 185 ug/dL

## 2018-07-10 LAB — C DIFFICILE QUICK SCREEN W PCR REFLEX
C Diff antigen: NEGATIVE
C Diff interpretation: NOT DETECTED
C Diff toxin: NEGATIVE

## 2018-07-10 LAB — GLUCOSE, CAPILLARY
Glucose-Capillary: 100 mg/dL — ABNORMAL HIGH (ref 70–99)
Glucose-Capillary: 108 mg/dL — ABNORMAL HIGH (ref 70–99)
Glucose-Capillary: 110 mg/dL — ABNORMAL HIGH (ref 70–99)
Glucose-Capillary: 110 mg/dL — ABNORMAL HIGH (ref 70–99)
Glucose-Capillary: 83 mg/dL (ref 70–99)
Glucose-Capillary: 95 mg/dL (ref 70–99)

## 2018-07-10 LAB — RETICULOCYTES
Immature Retic Fract: 23.3 % — ABNORMAL HIGH (ref 2.3–15.9)
RBC.: 2.91 MIL/uL — ABNORMAL LOW (ref 4.22–5.81)
Retic Count, Absolute: 48.9 10*3/uL (ref 19.0–186.0)
Retic Ct Pct: 1.7 % (ref 0.4–3.1)

## 2018-07-10 LAB — FERRITIN: Ferritin: 76 ng/mL (ref 24–336)

## 2018-07-10 LAB — VITAMIN B12: Vitamin B-12: 285 pg/mL (ref 180–914)

## 2018-07-10 MED ORDER — TRAVASOL 10 % IV SOLN
INTRAVENOUS | Status: AC
  Administered 2018-07-10: 18:00:00 via INTRAVENOUS
  Filled 2018-07-10: qty 549.6

## 2018-07-10 MED ORDER — LOPERAMIDE HCL 2 MG PO CAPS
2.0000 mg | ORAL_CAPSULE | Freq: Once | ORAL | Status: AC | PRN
  Administered 2018-07-10: 2 mg via ORAL
  Filled 2018-07-10: qty 1

## 2018-07-10 MED ORDER — ENSURE ENLIVE PO LIQD
237.0000 mL | Freq: Three times a day (TID) | ORAL | Status: DC
  Administered 2018-07-11: 237 mL via ORAL

## 2018-07-10 MED ORDER — METHOCARBAMOL 1000 MG/10ML IJ SOLN
500.0000 mg | Freq: Three times a day (TID) | INTRAVENOUS | Status: DC | PRN
  Administered 2018-07-10 – 2018-07-12 (×4): 500 mg via INTRAVENOUS
  Filled 2018-07-10 (×2): qty 5
  Filled 2018-07-10: qty 500
  Filled 2018-07-10 (×2): qty 5

## 2018-07-10 NOTE — Progress Notes (Signed)
[facility] Progress Note  7 Days Post-Op  Subjective: CC-  Patient states that he is not feeling well today. He reports having diarrhea about every 30 minutes. Passing flatus. Denies increased abdominal pain. Some intermittent nausea, no emesis. Tolerating small amount of clears/fulls, but does not have an appetite. Did not take any narcotics yesterday.  Objective: Vital signs in last 24 hours: Temp:  [97.9 F (36.6 C)-98.7 F (37.1 C)] 98.4 F (36.9 C) (05/05 0432) Pulse Rate:  [84-95] 84 (05/05 0432) Resp:  [16] 16 (05/05 0432) BP: (95-112)/(64-71) 112/69 (05/05 0432) SpO2:  [94 %-97 %] 94 % (05/05 0432) Last BM Date: 07/10/18  Intake/Output from previous day: 05/04 0701 - 05/05 0700 In: 3229.8 [P.O.:450; I.V.:2779.8] Out: 1950 [Urine:500; Drains:1450] Intake/Output this shift: Total I/O In: -  Out: 100 [Drains:100]  PE: Gen:  Alert, NAD, pleasant HEENT: EOM's intact, pupils equal and round Pulm:  effort normal Abd: Soft, ND, + BS, nontender, midline incision cdi with staples intact and no erythema or drainage, JP drain with serous output Skin: warm and dry   Lab Results:  Recent Labs    07/09/18 0451 07/10/18 0430  WBC 8.8 7.7  HGB 8.4* 8.3*  HCT 26.6* 26.2*  PLT 180 212   BMET Recent Labs    07/09/18 0451 07/10/18 0430  NA 136 136  K 4.1 4.0  CL 104 104  CO2 25 23  GLUCOSE 121* 103*  BUN 20 15  CREATININE 0.89 0.88  CALCIUM 7.8* 8.0*   PT/INR No results for input(s): LABPROT, INR in the last 72 hours. CMP     Component Value Date/Time   NA 136 07/10/2018 0430   K 4.0 07/10/2018 0430   CL 104 07/10/2018 0430   CO2 23 07/10/2018 0430   GLUCOSE 103 (H) 07/10/2018 0430   BUN 15 07/10/2018 0430   CREATININE 0.88 07/10/2018 0430   CALCIUM 8.0 (L) 07/10/2018 0430   PROT 4.1 (L) 07/09/2018 0451   ALBUMIN 1.6 (L) 07/09/2018 0451   AST 54 (H) 07/09/2018 0451   ALT 53 (H) 07/09/2018 0451   ALKPHOS 84 07/09/2018 0451   BILITOT 0.5  07/09/2018 0451   GFRNONAA >60 07/10/2018 0430   GFRAA >60 07/10/2018 0430   Lipase     Component Value Date/Time   LIPASE 54 (H) 06/29/2018 1221       Studies/Results: No results found.  Anti-infectives: Anti-infectives (From admission, onward)   Start     Dose/Rate Route Frequency Ordered Stop   07/03/18 1500  cefoTEtan (CEFOTAN) 2 g in sodium chloride 0.9 % 100 mL IVPB     2 g 200 mL/hr over 30 Minutes Intravenous To Surgery 07/03/18 1455 07/03/18 1513   07/03/18 1345  cefoTEtan in Dextrose 5% (CEFOTAN) IVPB 2 g  Status:  Discontinued     2 g 100 mL/hr over 30 Minutes Intravenous On call to O.R. 07/03/18 1312 07/03/18 1455       Assessment/Plan Hx ofnon-Hodgkin's lymphoma-s/p radiation and chemotherapy Hypertension Hyperlipidemia Atrophic left kidney Acute on chronic kidney disease Hyponatremia/hypokalemia- replacement per primary  SBO S/P ex lap, LOA, small bowel resection with resection of calcified mesenteric mass, duodenorraphy, excision ectopic calcification abdominal wall, Dr. Marlou Starks, 04/28 - POD 7 - surgical path pending - tolerating fulls and having bowel function, although having significant amount of diarrhea  FEN:FLD, Boost, 1/2 TPN ID: Cefotetan pre-op only DVT: lovenox, SCD's Foley: DC 05/01 Follow-up:Dr. Marlou Starks  Plan: Continue TPN at 50% until patient tolerating more PO.  Ok from surgical standpoint to advance to soft diet but patient wants to stay on clears/fulls today. Ok for 1 dose of imodium. CIR following, may progress to [facility].   LOS: 11 days    Wellington [place] , [place] [facility] 07/10/2018, 8:11 AM Pager: [phone] Mon-Thurs 7:00 am-4:30 pm Fri 7:00 am -11:30 AM Sat-Sun 7:00 am-11:30 am

## 2018-07-10 NOTE — Progress Notes (Signed)
While in patient's room he stated he wanted to let me know his cancer has returned. I explained to the patient I was very sorry to hear this new and that I was here to listen to him should he want to talk. He requested to speak with the chaplin. Notified chaplin services. Asked patient if he wanted his scheduled robaxin he stated yes. I scanned his oral robaxin through his IV. I informed him the Robaxin was oral only. He called me a liar and stated he would call his lawyer to speak with the risk management team and it will not be pretty. Also, requested to speak with his doctor.  Informed Dr. Donne Hazel of this incident and she changed oral Robaxin to IV. Informed patient I would administer the medication should he still desire it.

## 2018-07-10 NOTE — Progress Notes (Signed)
   Subjective: POD #7  He is not feeling very well and is having diarrhea every 30 minutes. The diarrhea started after he ate lunch yesterday. RN says he's starting to have some skin breakdown on his bottom. Reassured him that this is an expected part of his bowel regaining function and the diarrhea will get better. He remains hesitant to make many changes today with medications and would like to take it easy today and go back to a clear liquid diet. Spoke with surgery PA who offered to advance to soft diet but patient declined.   Objective:  Vital signs in last 24 hours: Vitals:   07/09/18 1022 07/09/18 1353 07/09/18 1957 07/10/18 0432  BP: 107/64 95/68 108/71 112/69  Pulse: 89 94 95 84  Resp: 16 16 16 16   Temp: 97.9 F (36.6 C) 98.4 F (36.9 C) 98.7 F (37.1 C) 98.4 F (36.9 C)  TempSrc: Oral Oral Oral Oral  SpO2: 95% 97% 94% 94%  Weight:      Height:       Gen: laying in bed, NAD Abd: soft, slightly TTP diffusely,  +BS, midline incision clean and dry, bandage removed, jp drain with clear output  Assessment/Plan:  Principal Problem:   SBO (small bowel obstruction) (HCC) Active Problems:   Calcified mesenteric mass  Mr. 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   #Small bowel obstruction s/p resection of calcified mesenteric mass: - POD#7 - hgb stable 8.3, iron and b12 low end of normal. Plan to replace IV and IM respectively once patient agrees.  - rectal tube for diarrhea if patient agrees, can also try one dose of immodium  - advance diet as tolerated - prn oxy and scheduled po  robaxin - TPN per pharmacy  - IVFs: LR 100cc/hr continuous  - PT/OT to help mobilize, recommending CIR but patient is unsure what he wants  #Hypertension: - BP stable - holding home valsartan - on maintenance fluids, but can bolus prn for symptomatic hypotension    Dispo: Anticipated discharge 2-3 days   Isabelle Course, MD 07/10/2018, 7:11 AM Pager: [phone]

## 2018-07-10 NOTE — Progress Notes (Signed)
PHARMACY - ADULT TOTAL PARENTERAL NUTRITION CONSULT NOTE   Pharmacy Consult for TPN Indication: SBO  Patient Measurements: Height: 5\' 10"  (177.8 cm) Weight: 159 lb 13.3 oz (72.5 kg) IBW/kg (Calculated) : 73 TPN AdjBW (KG): 72.5 Body mass index is 22.93 kg/m.  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 Pt has not been able to keep anything down since 4/17. Admitted 4/24. Has had high output from the NGT since admission  4/28 s/p EXPLORATORY LAPAROTOMY, LYSIS OF ADHESION, SMALL BOWEL RESECTION WITH RESECTION OF CALCIFIED MESENTERIC MASS, DUODENORRAPHY, EXCISION ECTOPIC CALCIFICATION ABDOMINAL WALL  GI: NGT removed 5/3; JP Drain - up to 1450 mLs. Prealbumin 6.5>10.7. LBM x 5 on 5/4 (diarrhea). Passing flatus. Denies abdominal pain. Intermittent nausea, no emesis.  Endo: No history of DM, on sensitive SS. CBGs 89-110. Insulin requirements in the past 24 hours: 2 units Lytes: K 4, last Phos 2.8 and Mag 1.9, Corr Ca 9.9 (Alb 1.6) Renal: Scr 0.88, BUN 15, UOP 0.3 ml/kg/hr  Pulm: RA Cards: Stable, HR 90 to low 100s Hepatobil:AST/ALT 54/53; Tbili 0.5, TGs 98 Neuro: Morphine PCA - pain score 3-5 ID: WBC down to within normal limits  TPN Access: PICC placed 4/28 TPN start date: 4/28 Nutritional Goals (per RD note on 4/30): Kcal:  2200-2400 Protein:  110-125 grams Fluid:  >2.2 L  Goal TPN rate is 100 mL/hr (provides 110 g of protein, 307 g of dextrose, and 76.8 g of lipids which provides 2252 kCals per day, meeting 100% of patient estimated needs)  Current Nutrition:  5/3 Will discontinue NG and start clear liquids. 5/4 Advance to full liquids and add Boost. Decrease TPN to 1/2. 5/5- Continue full liquids (100%intake last documented but not well documented) + Boost  (took both 5/4) + 1/2 TPN. - Surgery ok with soft diet but patient wants to continue with liquids.  Plan:  Continue TPN at half rate at 50 mL/hr per Surgery's progress note. This TPN provides 55 g of protein, 154 g of dextrose, and 38 g of lipids which provides 1126 kCals per day, meeting 50% of patient estimated needs Electrolytes in TPN: continue increased phos, otherwise standard electrolytes Add MVI to TPN - trace elements are on back order, will only place in TPN on Monday, Wednesday and Friday Monitor sensitive SSI and adjust as needed Monitor TPN labs F/u advancement of diet and ability to wean off TPN.   Jack Fuentes, PharmD, BCPS, BCCCP Clinical Pharmacist Please refer to [facility] for [place] numbers 07/10/2018, 8:26 AM

## 2018-07-10 NOTE — Progress Notes (Signed)
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 I have reviewed his chart, and discussed with pt in length. He was initially diagnosed DLBCL stage IV in 2000, and was treated at [facility], with chemotherapy (R-CHOP followed by R-ICE), and radiation.  Per patient, he did not achieve complete response, bone marrow transplant or further treatment was not recommended.  Has been followed by different oncologists at [facility], [place] and [place], with PET scan every a few years, until 2017. He has been taking turmeric since 2001, and pt believes it has been controlling his lymphoma.   Due to his recent surgery, I encourage pt to focus on recovery, and chemotherapy will be postponed for a few weeks at least. I will set up his consult with my partner Dr. Irene Limbo in our cancer center next week. Pt prefers to see [place].   We also discussed that he needs PET scan after discharge, he strongly prefers to have PET done with [facility]. He has had port placed. His CBC was normal on admission, no high suspicion for bone marrow involvement from lymphoma, will hold on bone marrow biopsy for now. Pt lives alone, does not have care giver or much other social support, but strongly wants to pursue treatment for his lymphoma. Although DLBCL is aggressive in general, his disease course is very atypical if he did not achieve complete response in 2000.  Treatment decision and regimen selection will be determined by Dr. Irene Limbo on his next visit.  Patient voiced good understanding about the above and agree with the arranement, I left my contact information to pt.   Please call us if needed before his discharge.  Truitt Merle  07/10/2018

## 2018-07-10 NOTE — Progress Notes (Signed)
  Date: 07/10/2018  Patient name: Jack Fuentes  Medical record number: [id number]  Date of birth: [birth year]   I have seen and evaluated this patient and I have discussed the plan of care with the house staff. Please see their note for complete details. I concur with their findings with the following additions/corrections: Mr. Sikora is seen this morning on team rounds.  The nausea is improved but he is now having uncontrollable diarrhea about every 30 minutes.  He thinks this is due to increased diet yesterday and plans to cut back and only drink some water today.  Surgery is checking a C. difficile.  It is likely the diarrhea is simply due to his body normalizing to a postsurgical state.  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  He has about 140 cm of small bowel left and this could be a possibility.  Best treated with PPIs which I do not want to start at this time as her diarrhea may be self-limited and his C. difficile test is pending.  Agree with iron and B12 supplementation.  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  Dr. Donne Hazel is currently informing the patient of this finding.  We will arrange for oncology follow-up after discussion with the patient.  [name], MD 07/10/2018, 12:20 PM

## 2018-07-10 NOTE — Progress Notes (Signed)
Patient states he wants to wait till later today to decide if he wants the rectal tube. x1 dose of imodium administered. Will continue to monitor.

## 2018-07-10 NOTE — Progress Notes (Signed)
Physical Therapy Treatment Patient Details Name: Jack Fuentes MRN: [id number] DOB: [birth year] Today's Date: 07/10/2018    History of Present Illness Pt is a 73 y.o. male admitted 06/29/18 with nausea/vomiting. Abdominal CT showed severe SBO. S/p exlap and resection of calcified mesenteric mass on 4/28. 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    PT Comments    Pt performed bed level exercises, rolling, bridging and repositioning.  He was limited due to watery stool to bed level.  Pt did require use of bed pan during session.  Informed nursing.  Plan for OOB mobility next session with progression to stair training if able.    Follow Up Recommendations  CIR;Supervision for mobility/OOB     Equipment Recommendations  Rolling walker with 5" wheels    Recommendations for Other Services Rehab consult;OT consult     Precautions / Restrictions Precautions Precautions: Fall Precaution Comments: PCA pump Restrictions Weight Bearing Restrictions: No    Mobility  Bed Mobility Overal bed mobility: Needs Assistance             General bed mobility comments: boosting and bridging in supine with supervision level of assistance.    Transfers Overall transfer level: (pt declining transfer due to multiple bouts of diarrhea)                  Ambulation/Gait                 Stairs             Wheelchair Mobility    Modified Rankin (Stroke Patients Only)       Balance                                            Cognition Arousal/Alertness: Lethargic Behavior During Therapy: Flat affect Overall Cognitive Status: Within Functional Limits for tasks assessed                                        Exercises General Exercises - Lower Extremity Ankle Circles/Pumps: AROM;Both;20 reps;Supine Quad Sets: AROM;Both;10 reps;Supine Heel Slides: AROM;10  reps;Supine;Both Hip ABduction/ADduction: Both;Supine;AROM;10 reps Straight Leg Raises: AROM;10 reps;Both;Supine Low Level/ICU Exercises Stabilized Bridging: AROM;5 reps;Supine    General Comments        Pertinent Vitals/Pain Pain Assessment: Faces Faces Pain Scale: Hurts little more Pain Location: Abdomen Pain Descriptors / Indicators: Discomfort Pain Intervention(s): Monitored during session;Repositioned    Home Living                      Prior Function            PT Goals (current goals can now be found in the care plan section) Acute Rehab PT Goals Patient Stated Goal: To stop having loose BMs Potential to Achieve Goals: Good Progress towards PT goals: Progressing toward goals    Frequency    Min 3X/week      PT Plan Current plan remains appropriate    Co-evaluation              AM-PAC PT "6 Clicks" Mobility   Outcome Measure  Help needed turning from your back to your side while in a flat bed without using bedrails?: A Little Help needed moving from lying on  your back to sitting on the side of a flat bed without using bedrails?: A Little Help needed moving to and from a bed to a chair (including a wheelchair)?: A Little Help needed standing up from a chair using your arms (e.g., wheelchair or bedside chair)?: A Little Help needed to walk in hospital room?: A Little Help needed climbing 3-5 steps with a railing? : A Lot 6 Click Score: 17    End of Session   Activity Tolerance: Other (comment);Treatment limited secondary to medical complications (Comment)(watery diarrhea) Patient left: in chair;with call bell/phone within reach;with chair alarm set Nurse Communication: Mobility status PT Visit Diagnosis: Other abnormalities of gait and mobility (R26.89);Pain Pain - part of body: (abdomen)     Time: 1761-6073 PT Time Calculation (min) (ACUTE ONLY): 18 min  Charges:  $Therapeutic Exercise: 8-22 mins                     Governor Rooks,  PTA Acute Rehabilitation Services Pager [phone] Office [place] Akeila Lana 07/10/2018, 12:27 PM

## 2018-07-10 NOTE — Progress Notes (Signed)
Nutrition Follow-up  RD working remotely.  DOCUMENTATION CODES:   Not applicable  INTERVENTION:   -D/c Boost Breeze po TID, each supplement provides 250 kcal and 9 grams of protein -Ensure Enlive po TID, each supplement provides 350 kcal and 20 grams of protein -TPN management per pharmacy  NUTRITION DIAGNOSIS:   Increased nutrient needs related to post-op healing as evidenced by estimated needs.  Ongoing  GOAL:   Patient will meet greater than or equal to 90% of their needs  Progressing   MONITOR:   PO intake, Supplement acceptance, Diet advancement, Labs, Weight trends, Skin, I & O's  REASON FOR ASSESSMENT:   Consult New TPN/TNA  ASSESSMENT:   Mr. Unrein is a 73 year old gentleman with retroperitoneal/mesenteric non-Hodgkin's lymphoma (dx 2000) status post chemo/radiation, HTN, Hypertriglyceridemia, LBBB who present with acute onset nausea and vomiting found to have severe small bowel obstruction on CT abdomen.   4/24- NGT placed to low, intermittent suction 4/28- s/p PICC placement, TPN initiated, s/p Procedure(s): EXPLORATORY LAPAROTOMY, LYSIS OF ADHESION,SMALLBOWEL RESECTIONWITH RESECTION OF CALCIFIED MESENTERIC MASS, DUODENORRAPHY, EXCISION ECTOPIC CALCIFICATION ABDOMINAL WALL 5/3- NGT d/c, advanced to clear liquids 5/4- decrease TPN to half, advanced to full liquids, add boost breeze  Reviewed I/O's: +1.3 L x 24 hours and -3.3 L since admission  UOP: 500 ml x 24 hours  Drains: 1.5 L x 24 hours  Surgical pathology still pending.   Pt with minimal intake due to poor appetite. General surgery offered to advance to soft diet today, however, pt politely declined. Pt taking Boost Breeze supplements per MAR.   Pt remains on TPN, receiving at 50 ml/hr. Regimen provides 1126 kcals and 55 grams protein, which meets 51% of kcal needs and 50% of protein needs. Per pharmacy notes, plan to continue TPN at current rate this evening.   Labs reviewed: CBGS: 89-110  (inpatient orders for glycemic control are 0-9 units insulin aspart every 4 hours).  Diet Order:   Diet Order            Diet full liquid Room service appropriate? Yes; Fluid consistency: Thin  Diet effective now              EDUCATION NEEDS:   Education needs have been addressed  Skin:  Skin Assessment: Skin Integrity Issues: Skin Integrity Issues:: Incisions Incisions: closed abdomen  Last BM:  07/10/18  Height:   Ht Readings from Last 1 Encounters:  07/03/18 5\' 10"  (1.778 m)    Weight:   Wt Readings from Last 1 Encounters:  07/03/18 72.5 kg    Ideal Body Weight:  75.5 kg  BMI:  Body mass index is 22.93 kg/m.  Estimated Nutritional Needs:   Kcal:  2200-2400  Protein:  110-125 grams  Fluid:  >2.2 L    Dorthie Santini A. Jimmye Norman, RD, LDN, [place] Registered Dietitian II Certified Diabetes Care and Education Specialist Pager: [phone] After hours Pager: [phone]

## 2018-07-11 ENCOUNTER — Telehealth: Payer: Self-pay | Admitting: Hematology

## 2018-07-11 LAB — GLUCOSE, CAPILLARY
Glucose-Capillary: 103 mg/dL — ABNORMAL HIGH (ref 70–99)
Glucose-Capillary: 88 mg/dL (ref 70–99)
Glucose-Capillary: 95 mg/dL (ref 70–99)
Glucose-Capillary: 95 mg/dL (ref 70–99)
Glucose-Capillary: 92 mg/dL (ref 70–99)

## 2018-07-11 MED ORDER — LOPERAMIDE HCL 2 MG PO CAPS
4.0000 mg | ORAL_CAPSULE | Freq: Once | ORAL | Status: AC
  Administered 2018-07-11: 21:00:00 4 mg via ORAL
  Filled 2018-07-11: qty 2

## 2018-07-11 MED ORDER — TRAVASOL 10 % IV SOLN
INTRAVENOUS | Status: DC
  Administered 2018-07-11: 18:00:00 via INTRAVENOUS
  Filled 2018-07-11: qty 549.6

## 2018-07-11 NOTE — Progress Notes (Addendum)
[facility] Progress Note  8 Days Post-Op  Subjective: CC-  Still not feeling great but a little better than yesterday. Diarrhea has decreased to about once every hour. c diff negative. Denies n/v. Tolerating diet but still not eating much.  Objective: Vital signs in last 24 hours: Temp:  [98.2 F (36.8 C)-98.4 F (36.9 C)] 98.2 F (36.8 C) (05/06 0423) Pulse Rate:  [94-104] 94 (05/06 0423) Resp:  [16-17] 16 (05/06 0423) BP: (118-119)/(77-82) 118/82 (05/06 0423) SpO2:  [95 %-96 %] 95 % (05/06 0423) Last BM Date: 07/10/18  Intake/Output from previous day: 05/05 0701 - 05/06 0700 In: 1558.7 [P.O.:240; I.V.:1218.7; IV Piggyback:100] Out: 1690 [Urine:1075; Drains:615] Intake/Output this shift: Total I/O In: -  Out: 175 [Urine:175]  PE: Gen: Alert, NAD, pleasant HEENT: EOM's intact, pupils equal and round Pulm: effort normal Abd: Soft, ND,+BS, nontender, midline incision cdi with staples intact and no erythema or drainage, JP drain with serous output Skin:warm and dry    Lab Results:  Recent Labs    07/09/18 0451 07/10/18 0430  WBC 8.8 7.7  HGB 8.4* 8.3*  HCT 26.6* 26.2*  PLT 180 212   BMET Recent Labs    07/09/18 0451 07/10/18 0430  NA 136 136  K 4.1 4.0  CL 104 104  CO2 25 23  GLUCOSE 121* 103*  BUN 20 15  CREATININE 0.89 0.88  CALCIUM 7.8* 8.0*   PT/INR No results for input(s): LABPROT, INR in the last 72 hours. CMP     Component Value Date/Time   NA 136 07/10/2018 0430   K 4.0 07/10/2018 0430   CL 104 07/10/2018 0430   CO2 23 07/10/2018 0430   GLUCOSE 103 (H) 07/10/2018 0430   BUN 15 07/10/2018 0430   CREATININE 0.88 07/10/2018 0430   CALCIUM 8.0 (L) 07/10/2018 0430   PROT 4.1 (L) 07/09/2018 0451   ALBUMIN 1.6 (L) 07/09/2018 0451   AST 54 (H) 07/09/2018 0451   ALT 53 (H) 07/09/2018 0451   ALKPHOS 84 07/09/2018 0451   BILITOT 0.5 07/09/2018 0451   GFRNONAA >60 07/10/2018 0430   GFRAA >60 07/10/2018 0430   Lipase      Component Value Date/Time   LIPASE 54 (H) 06/29/2018 1221       Studies/Results: No results found.  Anti-infectives: Anti-infectives (From admission, onward)   Start     Dose/Rate Route Frequency Ordered Stop   07/03/18 1500  cefoTEtan (CEFOTAN) 2 g in sodium chloride 0.9 % 100 mL IVPB     2 g 200 mL/hr over 30 Minutes Intravenous To Surgery 07/03/18 1455 07/03/18 1513   07/03/18 1345  cefoTEtan in Dextrose 5% (CEFOTAN) IVPB 2 g  Status:  Discontinued     2 g 100 mL/hr over 30 Minutes Intravenous On call to O.R. 07/03/18 1312 07/03/18 1455       Assessment/Plan Hx ofnon-Hodgkin's lymphoma-s/p radiation and chemotherapy Hypertension Hyperlipidemia Atrophic left kidney Acute on chronic kidney disease Hyponatremia/hypokalemia- replacement per primary  SBO S/P ex lap, LOA, small bowel resection with resection of calcified mesenteric mass, duodenorraphy, excision ectopic calcification abdominal wall, Dr. Marlou Starks, 04/28 - POD 8 - drain with serous output - surgical path: DIFFUSE LARGE B-CELL LYMPHOMA - appreciate oncology consult, planning outpatient f/u - tolerating fulls, continues to have diarrhea but it is decreasing  FEN: soft diet, Boost, 1/2 TPN ID: Cefotetan pre-op only DVT: lovenox, SCD's Foley: DC 05/01 Follow-up:Dr. Marlou Starks  Plan: Advance to soft diet and continue TPN at 50% until patient  tolerating more PO. Appreciate oncology consult. CIR following, may progress to [facility].    LOS: 12 days    Jack Fuentes , [facility] 07/11/2018, 8:34 AM Pager: [phone] Mon-Thurs 7:00 am-4:30 pm Fri 7:00 am -11:30 AM Sat-Sun 7:00 am-11:30 am

## 2018-07-11 NOTE — Progress Notes (Signed)
Physical Therapy Treatment Patient Details Name: Jack Fuentes MRN: [id number] DOB: 04-Sep-1945 Today's Date: 07/11/2018    History of Present Illness Pt is a 73 y.o. male admitted 06/29/18 with nausea/vomiting. Abdominal CT showed severe SBO. S/p exlap and resection of calcified mesenteric mass on 4/28. 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    PT Comments    Pt performed OOB transfer from bed to [facility] and BSC to recliner.  Pt fatigues quickly as he continues to be limited due to loose watery BMs.  Pt require min to min guard assistance.  Plan next session to progress gait to tolerance.  Of note when performing pericare patient sore to touch upon further inspection presents with start of break down in gluteal cleft, informed nursing.      Follow Up Recommendations  CIR;Supervision for mobility/OOB     Equipment Recommendations  Rolling walker with 5" wheels    Recommendations for Other Services       Precautions / Restrictions Precautions Precautions: Fall Precaution Comments: break down on bottom and sore from irritation from diarrhea Restrictions Weight Bearing Restrictions: No    Mobility  Bed Mobility Overal bed mobility: Needs Assistance Bed Mobility: Supine to Sit     Supine to sit: Min assist     General bed mobility comments: Pt required assistance to roll to R and to elevate trunk into sitting.    Transfers Overall transfer level: Needs assistance Equipment used: Rolling walker (2 wheeled) Transfers: Sit to/from Stand Sit to Stand: Min guard         General transfer comment: min guard with cues for hand placement.   Ambulation/Gait Ambulation/Gait assistance: Min assist Gait Distance (Feet): 6 Feet(steps from bed to bsc and bsc to recliner.  ) Assistive device: Rolling walker (2 wheeled) Gait Pattern/deviations: Step-through pattern;Decreased stride length     General Gait Details: Pt  fatiguing quickly, slow and guarded.     Stairs             Wheelchair Mobility    Modified Rankin (Stroke Patients Only)       Balance Overall balance assessment: Needs assistance Sitting-balance support: Feet supported Sitting balance-Leahy Scale: Fair       Standing balance-Leahy Scale: Poor Standing balance comment: Reliant on UE support                            Cognition Arousal/Alertness: Lethargic Behavior During Therapy: Flat affect Overall Cognitive Status: Within Functional Limits for tasks assessed                                 General Comments: better focus today, appears depressed      Exercises      General Comments        Pertinent Vitals/Pain Pain Assessment: Faces Faces Pain Scale: Hurts little more Pain Location: Abdomen Pain Descriptors / Indicators: Discomfort Pain Intervention(s): Monitored during session;Repositioned    Home Living                      Prior Function            PT Goals (current goals can now be found in the care plan section) Acute Rehab PT Goals Patient Stated Goal: To stop having loose BMs Potential to Achieve Goals: Good Progress towards PT goals: Progressing toward goals  Frequency    Min 3X/week      PT Plan Current plan remains appropriate    Co-evaluation              AM-PAC PT "6 Clicks" Mobility   Outcome Measure  Help needed turning from your back to your side while in a flat bed without using bedrails?: A Little Help needed moving from lying on your back to sitting on the side of a flat bed without using bedrails?: A Little Help needed moving to and from a bed to a chair (including a wheelchair)?: A Little Help needed standing up from a chair using your arms (e.g., wheelchair or bedside chair)?: A Little Help needed to walk in hospital room?: A Little Help needed climbing 3-5 steps with a railing? : A Little 6 Click Score: 18    End  of Session   Activity Tolerance: Patient limited by fatigue Patient left: in chair;with call bell/phone within reach;with chair alarm set Nurse Communication: Mobility status(informed nursing via secure chat of skin breakdown on bottom.  ) PT Visit Diagnosis: Other abnormalities of gait and mobility (R26.89);Pain Pain - part of body: (abdomen)     Time: 2683-4196 PT Time Calculation (min) (ACUTE ONLY): 25 min  Charges:  $Gait Training: 8-22 mins $Therapeutic Activity: 8-22 mins                     Governor Rooks, PTA Acute Rehabilitation Services Pager [phone] Office [place] 07/11/2018, 12:14 PM

## 2018-07-11 NOTE — Progress Notes (Signed)
[facility] NOTE   Pharmacy Consult for TPN Indication: SBO  Patient Measurements: Height: 5\' 10"  (177.8 cm) Weight: 159 lb 13.3 oz (72.5 kg) IBW/kg (Calculated) : 73 TPN AdjBW (KG): 72.5 Body mass index is 22.93 kg/m.  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 Pt has not been able to keep anything down since 4/17. Admitted 4/24. Has had high output from the NGT since admission  4/28 s/p EXPLORATORY LAPAROTOMY, LYSIS OF ADHESION, SMALL BOWEL RESECTION WITH RESECTION OF CALCIFIED MESENTERIC MASS, DUODENORRAPHY, EXCISION ECTOPIC CALCIFICATION ABDOMINAL WALL  GI: NGT removed 5/3; JP Drain output 615 mLs. Prealbumin 6.5>10.7. Diarrhea. Passing flatus. Denies N/V. Endo: No history of DM, on sensitive SS. CBGs 89-110. Insulin requirements in the past 24 hours: 2 units Lytes: Lytes wnl on 5/5, Corr Ca 9.9 (Alb 1.6) Renal: Scr 0.88, BUN 15, UOP 0.6 ml/kg/hr (plus 1 unmeasured occurrence). LR at 100. Pulm: RA Cards: Stable, HR 90 to low 100s Hepatobil:AST/ALT 54/53; Tbili 0.5, TGs 98 Neuro: Morphine PCA - pain score 3-5 ID: WBC down to within normal limits. Cdiff negative.  TPN Access: PICC placed 4/28 TPN start date: 4/28 Nutritional Goals (per RD note on 5/5): Kcal:  2200-2400 Protein:  110-125 grams Fluid:  >2.2 L  Goal TPN rate is 100 mL/hr (provides 110 g of protein, 307 g of dextrose, and 76.8 g of lipids which provides 2252 kCals per day, meeting 100% of patient estimated needs)  Current Nutrition:  5/3 Will discontinue NG and start clear liquids. 5/4 Advance to full liquids and add Boost. Decrease TPN to 1/2. 5/5 Continue full liquids + Boost (took both 5/4) + 1/2 TPN. - Surgery ok with soft diet but patient wants  to continue with liquids. 5/6: Advancing to soft diet (low intake). Boost changed to Ensure Enlive. Continue 1/2 TPN per Surgery.  Plan:  Continue TPN at half rate at 50 mL/hr per Surgery progress note. This TPN provides 55 g of protein, 154 g of dextrose, and 38 g of lipids which provides 1126 kCals per day, meeting 50% of patient estimated needs Electrolytes in TPN: continue increased phos, otherwise standard electrolytes Add MVI to TPN - trace elements are on back order, will only place in TPN on Monday, Wednesday and Friday Monitor sensitive SSI and adjust as needed Monitor TPN labs LR per MD.  F/u advancement of diet and ability to wean off TPN.   Sloan Leiter, PharmD, BCPS, BCCCP Clinical Pharmacist Please refer to [facility] for [place] numbers 07/11/2018, 8:01 AM

## 2018-07-11 NOTE — Progress Notes (Signed)
  Date: 07/11/2018  Patient name: Jack Fuentes  Medical record number: [id number]  Date of birth: 1945/05/05   I have seen and evaluated this patient and I have discussed the plan of care with the house staff. Please see their note for complete details. I concur with their findings with the following additions/corrections: Jack Fuentes was seen this morning on team rounds.  Dr. Donne Hazel had informed him of the pathology report showing diffuse large B-cell lymphoma yesterday.  Dr. Burr Medico consulted on the patient yesterday and left a very helpful note.  Outpatient follow-up has already been scheduled.  Today, the patient is a bit more animated.  He plans to take a walk later this morning.  Oral intake is still suboptimal, TPN at 50%, diarrhea is slightly slowing down.  [name], MD 07/11/2018, 11:38 AM

## 2018-07-11 NOTE — Plan of Care (Signed)

## 2018-07-11 NOTE — Telephone Encounter (Signed)
A new patient appt has been scheduled for the pt to see Dr. Irene Limbo on 5/13 at 11am. Pt is currently in the hospital.

## 2018-07-11 NOTE — Progress Notes (Signed)
   Subjective: POD #8  Feeling some better. Diarrhea improved to q1h. C. Diff neg. Planning to insert rectal tube this morning, walk today and try to eat something. Had a good discussion with oncology yesterday.  Objective:  Vital signs in last 24 hours: Vitals:   07/09/18 1957 07/10/18 0432 07/10/18 2022 07/11/18 0423  BP: 108/71 112/69 119/77 118/82  Pulse: 95 84 (!) 104 94  Resp: 16 16 17 16   Temp: 98.7 F (37.1 C) 98.4 F (36.9 C) 98.4 F (36.9 C) 98.2 F (36.8 C)  TempSrc: Oral Oral Oral Oral  SpO2: 94% 94% 96% 95%  Weight:      Height:       Gen: laying in bed, NAD Abd: soft, slightly TTP diffusely,  +BS, midline incision clean and dry, bandage removed, jp drain with clear output  Assessment/Plan:  Principal Problem:   SBO (small bowel obstruction) (HCC) Active Problems:   Calcified mesenteric mass   Pressure injury of skin  Jack Fuentes is a 73 year old 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   #Small bowel obstruction s/p resection of calcified mesenteric mass: - POD#8 - needs IV iron and IM b12 once he agrees  - rectal tube for diarrhea if patient agrees - advance diet as tolerated - prn po oxy, changed robaxin back to IV form per patient request  - prn po tylenol  - TPN per pharmacy  - IVFs: LR 100cc/hr continuous  - PT/OT to help mobilize, recommending CIR but may progress to [facility]  #Hypertension: - BP stable - holding home valsartan - on maintenance fluids, but can bolus prn for symptomatic hypotension   #Diffuse Large B cell Lymphoma: - appreciate oncology consultation - outpt f/u     Dispo: Anticipated discharge 2-3 days   Isabelle Course, MD 07/11/2018, 10:30 AM Pager: [phone]

## 2018-07-12 LAB — MAGNESIUM: Magnesium: 1.8 mg/dL (ref 1.7–2.4)

## 2018-07-12 LAB — COMPREHENSIVE METABOLIC PANEL
ALT: 53 U/L — ABNORMAL HIGH (ref 0–44)
AST: 38 U/L (ref 15–41)
Albumin: 1.7 g/dL — ABNORMAL LOW (ref 3.5–5.0)
Alkaline Phosphatase: 76 U/L (ref 38–126)
Anion gap: 7 (ref 5–15)
BUN: 10 mg/dL (ref 8–23)
CO2: 19 mmol/L — ABNORMAL LOW (ref 22–32)
Calcium: 7.9 mg/dL — ABNORMAL LOW (ref 8.9–10.3)
Chloride: 109 mmol/L (ref 98–111)
Creatinine, Ser: 0.81 mg/dL (ref 0.61–1.24)
GFR calc Af Amer: >60 mL/min (ref >60)
GFR calc non Af Amer: >60 mL/min (ref >60)
Glucose, Bld: 91 mg/dL (ref 70–99)
Potassium: 3.8 mmol/L (ref 3.5–5.1)
Sodium: 135 mmol/L (ref 135–145)
Total Bilirubin: 0.4 mg/dL (ref 0.3–1.2)
Total Protein: 4.5 g/dL — ABNORMAL LOW (ref 6.5–8.1)

## 2018-07-12 LAB — CBC
HCT: 26.9 % — ABNORMAL LOW (ref 39.0–52.0)
Hemoglobin: 8.4 g/dL — ABNORMAL LOW (ref 13.0–17.0)
MCH: 27.8 pg (ref 26.0–34.0)
MCHC: 31.2 g/dL (ref 30.0–36.0)
MCV: 89.1 fL (ref 80.0–100.0)
Platelets: 277 10*3/uL (ref 150–400)
RBC: 3.02 MIL/uL — ABNORMAL LOW (ref 4.22–5.81)
RDW: 13.6 % (ref 11.5–15.5)
WBC: 8.1 10*3/uL (ref 4.0–10.5)
nRBC: 0 % (ref 0.0–0.2)

## 2018-07-12 LAB — PHOSPHORUS: Phosphorus: 3.1 mg/dL (ref 2.5–4.6)

## 2018-07-12 MED ORDER — ENSURE ENLIVE PO LIQD: 237.0000 mL | Freq: Two times a day (BID) | ORAL | Status: DC

## 2018-07-12 MED ORDER — PSYLLIUM 95 % PO PACK
1.0000 | PACK | Freq: Every day | ORAL | Status: DC
  Administered 2018-07-12: 1 via ORAL
  Filled 2018-07-12 (×2): qty 1

## 2018-07-12 MED ORDER — ADULT MULTIVITAMIN W/MINERALS CH
1.0000 | ORAL_TABLET | Freq: Every day | ORAL | Status: DC
  Administered 2018-07-13: 09:00:00 1 via ORAL
  Filled 2018-07-12: qty 1

## 2018-07-12 MED ORDER — METHOCARBAMOL 500 MG PO TABS: 500.0000 mg | ORAL_TABLET | Freq: Three times a day (TID) | ORAL | Status: DC | PRN

## 2018-07-12 MED ORDER — PRO-STAT SUGAR FREE PO LIQD
30.0000 mL | Freq: Two times a day (BID) | ORAL | Status: DC
  Administered 2018-07-12: 30 mL via ORAL
  Filled 2018-07-12 (×3): qty 30

## 2018-07-12 NOTE — Progress Notes (Signed)
[facility] Progress Note  9 Days Post-Op  Subjective: CC-  Continues to have diarrhea about every hour. States that his abdominal pain is well controlled, typically only hurts with mobilization. Denies n/v. Appetite slowly returning. He is tolerating soft diet.   Objective: Vital signs in last 24 hours: Temp:  [97.6 F (36.4 C)-98.2 F (36.8 C)] 98 F (36.7 C) (05/07 0352) Pulse Rate:  [72-89] 85 (05/07 0352) Resp:  [18] 18 (05/07 0352) BP: (104-115)/(64-71) 115/71 (05/07 0352) SpO2:  [97 %-100 %] 97 % (05/07 0352) Last BM Date: 07/11/18  Intake/Output from previous day: 05/06 0701 - 05/07 0700 In: 720 [P.O.:720] Out: 2955 [Urine:1950; Drains:555; Stool:450] Intake/Output this shift: Total I/O In: -  Out: 275 [Urine:250; Stool:25]  PE: Gen: Alert, NAD, pleasant HEENT: EOM's intact, pupils equal and round Pulm: effort normal Abd: Soft, ND,+BS,nontender, midline incision cdi with staples intact and no erythema or drainage, JP drain with serous output Skin:warm and dry    Lab Results:  Recent Labs    07/10/18 0430 07/12/18 0415  WBC 7.7 8.1  HGB 8.3* 8.4*  HCT 26.2* 26.9*  PLT 212 277   BMET Recent Labs    07/10/18 0430 07/12/18 0415  NA 136 135  K 4.0 3.8  CL 104 109  CO2 23 19*  GLUCOSE 103* 91  BUN 15 10  CREATININE 0.88 0.81  CALCIUM 8.0* 7.9*   PT/INR No results for input(s): LABPROT, INR in the last 72 hours. CMP     Component Value Date/Time   NA 135 07/12/2018 0415   K 3.8 07/12/2018 0415   CL 109 07/12/2018 0415   CO2 19 (L) 07/12/2018 0415   GLUCOSE 91 07/12/2018 0415   BUN 10 07/12/2018 0415   CREATININE 0.81 07/12/2018 0415   CALCIUM 7.9 (L) 07/12/2018 0415   PROT 4.5 (L) 07/12/2018 0415   ALBUMIN 1.7 (L) 07/12/2018 0415   AST 38 07/12/2018 0415   ALT 53 (H) 07/12/2018 0415   ALKPHOS 76 07/12/2018 0415   BILITOT 0.4 07/12/2018 0415   GFRNONAA >60 07/12/2018 0415   GFRAA >60 07/12/2018 0415   Lipase      Component Value Date/Time   LIPASE 54 (H) 06/29/2018 1221       Studies/Results: No results found.  Anti-infectives: Anti-infectives (From admission, onward)   Start     Dose/Rate Route Frequency Ordered Stop   07/03/18 1500  cefoTEtan (CEFOTAN) 2 g in sodium chloride 0.9 % 100 mL IVPB     2 g 200 mL/hr over 30 Minutes Intravenous To Surgery 07/03/18 1455 07/03/18 1513   07/03/18 1345  cefoTEtan in Dextrose 5% (CEFOTAN) IVPB 2 g  Status:  Discontinued     2 g 100 mL/hr over 30 Minutes Intravenous On call to O.R. 07/03/18 1312 07/03/18 1455       Assessment/Plan Hx ofnon-Hodgkin's lymphoma-s/p radiation and chemotherapy Hypertension Hyperlipidemia Atrophic left kidney Acute on chronic kidney disease Hyponatremia/hypokalemia- replacement per primary  SBO S/P ex lap, LOA, small bowel resection with resection of calcified mesenteric mass, duodenorraphy, excision ectopic calcification abdominal wall, Dr. Marlou Starks, 04/28 - POD9 - drain with serous output, can d/c prior to discharge - surgical path: DIFFUSE LARGE B-CELL LYMPHOMA - appreciate oncology consult, planning outpatient f/u - tolerating diet, continues to have diarrhea  FEN: reg diet, Ensure ID: Cefotetan pre-op only DVT: lovenox, SCD's Foley: DC 05/01 Follow-up:Dr. Marlou Starks  Plan:Advance to regular diet. D/c TPN. Add metamucil to help bulk stools. continue PT. CIR following,  may progress to [facility].     LOS: 13 days    Wellington Hampshire , [facility] 07/12/2018, 8:09 AM Pager: [phone] Mon-Thurs 7:00 am-4:30 pm Fri 7:00 am -11:30 AM Sat-Sun 7:00 am-11:30 am

## 2018-07-12 NOTE — Progress Notes (Signed)
   Subjective: POD #9  Overnight, he was given a dose of immodium for continued diarrhea. This morning, he is feeling alright although he had some trouble sleeping last night. He continues to have diarrhea and will be getting a rectal tube today. He was able to walk a little yesterday. He says getting up is the hardest part. No dizziness today. Eating more, says appetite is improving. Had some mild nausea that relieved with zofran.   Objective:  Vital signs in last 24 hours: Vitals:   07/11/18 0423 07/11/18 1411 07/11/18 2003 07/12/18 0352  BP: 118/82 104/64 106/67 115/71  Pulse: 94 72 89 85  Resp: 16 18 18 18   Temp: 98.2 F (36.8 C) 97.6 F (36.4 C) 98.2 F (36.8 C) 98 F (36.7 C)  TempSrc: Oral Oral Oral Oral  SpO2: 95% 100% 99% 97%  Weight:      Height:       Gen: laying in bed, NAD Abd: soft, slightly TTP, midline incision is clean and dry +BS  Assessment/Plan:  Principal Problem:   SBO (small bowel obstruction) (HCC) Active Problems:   Calcified mesenteric mass   Pressure injury of skin  Mr. 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   #Small bowel obstruction s/p resection of calcified mesenteric mass: - POD#9 - needs IV iron and IM b12 once he agrees, would like to wait on this  - rectal tube for diarrhea - advance diet as tolerated - prn po oxy and po tylenol - change robaxin back to po  - d/c IVFs - d/c TPN - PT/OT to help mobilize, recommending CIR but may progress to [facility]  #Hypertension: - BP stable - holding home valsartan - on maintenance fluids, but can bolus prn  for symptomatic hypotension   #Diffuse Large B cell Lymphoma: - appreciate oncology consultation - outpt f/u    Dispo: Anticipated discharge 2-3 days   Isabelle Course, MD 07/12/2018, 9:33 AM Pager: [phone]

## 2018-07-12 NOTE — Plan of Care (Signed)

## 2018-07-12 NOTE — Progress Notes (Signed)
Physical Therapy Treatment Patient Details Name: NTHONY LEFFERTS MRN: [id number] DOB: 10/17/1945 Today's Date: 07/12/2018    History of Present Illness Pt is a 73 y.o. male admitted 06/29/18 with nausea/vomiting. Abdominal CT showed severe SBO. S/p exlap and resection of calcified mesenteric mass on 4/28. 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    PT Comments    Pt performed progression of gait training and functional mobility, he progressing well.  He continues to be reliant on RW and he was not using this device at baseline.  B quad weakness observed.  He has the ability to return home independently with aggressive rehab.  Continue to recommend CIR and attempt gait without device next session.      Follow Up Recommendations  CIR;Supervision for mobility/OOB     Equipment Recommendations  Rolling walker with 5" wheels    Recommendations for Other Services Rehab consult;OT consult     Precautions / Restrictions Precautions Precautions: Fall Precaution Comments: break down on bottom and sore from irritation from diarrhea Restrictions Weight Bearing Restrictions: No    Mobility  Bed Mobility Overal bed mobility: Needs Assistance Bed Mobility: Supine to Sit     Supine to sit: Supervision;HOB elevated Sit to supine: Supervision;HOB elevated   General bed mobility comments: Pt able to come to edge of bed and return to supine unassisted.    Transfers Overall transfer level: Needs assistance Equipment used: Rolling walker (2 wheeled) Transfers: Sit to/from Stand Sit to Stand: Supervision         General transfer comment: Supervision for safety  Ambulation/Gait Ambulation/Gait assistance: Min guard Gait Distance (Feet): 250 Feet Assistive device: Rolling walker (2 wheeled) Gait Pattern/deviations: Step-through pattern;Decreased stride length;Trunk flexed Gait velocity: Decreased   General Gait Details: Cues  for head control and forward gaze, cues for B foot clearance.   Stairs             Wheelchair Mobility    Modified Rankin (Stroke Patients Only)       Balance Overall balance assessment: Needs assistance   Sitting balance-Leahy Scale: Good       Standing balance-Leahy Scale: Fair                              Cognition Arousal/Alertness: Awake/alert Behavior During Therapy: WFL for tasks assessed/performed Overall Cognitive Status: Within Functional Limits for tasks assessed                                 General Comments: more alert and able to progress mobility      Exercises General Exercises - Lower Extremity Ankle Circles/Pumps: AROM;Both;20 reps;Supine Quad Sets: AROM;Both;10 reps;Supine Heel Slides: AROM;10 reps;Supine;Both Hip ABduction/ADduction: Both;Supine;AROM;10 reps Straight Leg Raises: AROM;10 reps;Both;Supine;Limitations Straight Leg Raises Limitations: B extensor lag    General Comments        Pertinent Vitals/Pain Pain Assessment: Faces Faces Pain Scale: Hurts little more Pain Location: Abdomen Pain Descriptors / Indicators: Discomfort Pain Intervention(s): Monitored during session;Repositioned    Home Living                      Prior Function            PT Goals (current goals can now be found in the care plan section) Acute Rehab PT Goals Patient Stated Goal: To get stronger Potential to Achieve Goals:  Good Progress towards PT goals: Progressing toward goals    Frequency    Min 3X/week      PT Plan Current plan remains appropriate    Co-evaluation              AM-PAC PT "6 Clicks" Mobility   Outcome Measure  Help needed turning from your back to your side while in a flat bed without using bedrails?: A Little Help needed moving from lying on your back to sitting on the side of a flat bed without using bedrails?: A Little Help needed moving to and from a bed to a chair  (including a wheelchair)?: A Little Help needed standing up from a chair using your arms (e.g., wheelchair or bedside chair)?: A Little Help needed to walk in hospital room?: A Little Help needed climbing 3-5 steps with a railing? : A Little 6 Click Score: 18    End of Session Equipment Utilized During Treatment: Gait belt Activity Tolerance: Patient limited by fatigue Patient left: in chair;with call bell/phone within reach;with chair alarm set Nurse Communication: Mobility status PT Visit Diagnosis: Other abnormalities of gait and mobility (R26.89);Pain Pain - part of body: (abdomen)     Time: 6767-2094 PT Time Calculation (min) (ACUTE ONLY): 23 min  Charges:  $Gait Training: 8-22 mins $Therapeutic Exercise: 8-22 mins                     Governor Rooks, PTA Acute Rehabilitation Services Pager [phone] Office [place] 07/12/2018, 2:45 PM

## 2018-07-12 NOTE — Progress Notes (Signed)
  Date: 07/12/2018  Patient name: Jack Fuentes  Medical record number: [id number]  Date of birth: 12-02-1945   I have seen and evaluated this patient and I have discussed the plan of care with the house staff. Please see their note for complete details. I concur with their findings with the following additions/corrections: [name] was seen this morning on team rounds.  He was able to eat fairly well yesterday and although his diarrhea is continuing with 9 episodes yesterday, it is slightly improved.  He was able to walk yesterday and is overall making slow but steady improvement.  Surgery has added Metamucil to try to bulk his stools.  [name], MD 07/12/2018, 11:42 AM

## 2018-07-12 NOTE — Progress Notes (Signed)
Nutrition Follow-up  RD working remotely.  DOCUMENTATION CODES:   Not applicable  INTERVENTION:   -TPN management per pharmacy; to d/c today -Decrease Ensure Enlive po to BID, each supplement provides 350 kcal and 20 grams of protein -30 ml Prostat BID, each supplement provides 100 kcals and 15 grams protein -MVI with minerals daily  NUTRITION DIAGNOSIS:   Increased nutrient needs related to post-op healing as evidenced by estimated needs.  Ongoing  GOAL:   Patient will meet greater than or equal to 90% of their needs  Progressing  MONITOR:   PO intake, Supplement acceptance, Diet advancement, Labs, Weight trends, Skin, I & O's  REASON FOR ASSESSMENT:   Consult New TPN/TNA  ASSESSMENT:   Mr. Duve is a 73 year old gentleman with retroperitoneal/mesenteric non-Hodgkin's lymphoma (dx 2000) status post chemo/radiation, HTN, Hypertriglyceridemia, LBBB who present with acute onset nausea and vomiting found to have severe small bowel obstruction on CT abdomen.   4/24- NGT placed to low, intermittent suction 4/28- s/p PICC placement, TPN initiated, s/pProcedure(s): EXPLORATORY LAPAROTOMY, LYSIS OF ADHESION,SMALLBOWEL RESECTIONWITH RESECTION OF CALCIFIED MESENTERIC MASS, DUODENORRAPHY, EXCISION ECTOPIC CALCIFICATION ABDOMINAL WALL 5/3- NGT d/c, advanced to clear liquids 5/4- decrease TPN to half, advanced to full liquids, add boost breeze 5/6- c-diff negative  Reviewed I/O's: -2.2 L x 24 hours and -5.7 L since admission  UOP: 2 L x 24 hours  Drains: 555 ml x 24 hours  Stool: 450 ml x 24 hours  Surgical pathology revealed diffiuse large b-cell lymphoma. Oncology is following   Per chart review, pt continues to have diarrhea. Per surgery notes, plan to add metamucil to bulk up stools.   Pt reports improved appetite, however, intake still minimal. Noted meal completion 25-30%. Pt had been refusing Ensure supplements, however, accepted last two doses.   Per  pharmacy note, pt receiving TPN at half rate of 50 ml/hr, which provides 1126 kcals and 55 grams protein, meeting 50% of estimated kcal and protein needs. Plan to d/c TPN today.   Labs reviewed: CBGS: 88-95   Diet Order:   Diet Order            Diet regular Room service appropriate? Yes; Fluid consistency: Thin  Diet effective now              EDUCATION NEEDS:   Education needs have been addressed  Skin:  Skin Assessment: Skin Integrity Issues: Skin Integrity Issues:: Incisions Incisions: closed abdomen  Last BM:  07/12/18  Height:   Ht Readings from Last 1 Encounters:  07/03/18 5\' 10"  (1.778 m)    Weight:   Wt Readings from Last 1 Encounters:  07/03/18 72.5 kg    Ideal Body Weight:  75.5 kg  BMI:  Body mass index is 22.93 kg/m.  Estimated Nutritional Needs:   Kcal:  2200-2400  Protein:  110-125 grams  Fluid:  >2.2 L    Jashua Knaak A. Jimmye Norman, RD, LDN, [place] Registered Dietitian II Certified Diabetes Care and Education Specialist Pager: [phone] After hours Pager: [phone]

## 2018-07-13 DIAGNOSIS — D649 Anemia, unspecified: Secondary | ICD-10-CM

## 2018-07-13 LAB — GLUCOSE, CAPILLARY
Glucose-Capillary: 78 mg/dL (ref 70–99)
Glucose-Capillary: 86 mg/dL (ref 70–99)

## 2018-07-13 MED ORDER — HEPARIN SOD (PORK) LOCK FLUSH 100 UNIT/ML IV SOLN
250.0000 [IU] | INTRAVENOUS | Status: AC | PRN
  Administered 2018-07-13: 250 [IU]

## 2018-07-13 MED ORDER — SACCHAROMYCES BOULARDII 250 MG PO CAPS: 250.0000 mg | ORAL_CAPSULE | Freq: Two times a day (BID) | ORAL | Status: DC

## 2018-07-13 MED ORDER — ADULT MULTIVITAMIN W/MINERALS CH: 1.0000 | ORAL_TABLET | Freq: Every day | ORAL | Status: AC

## 2018-07-13 MED ORDER — PSYLLIUM 95 % PO PACK: 1.0000 | PACK | Freq: Every day | ORAL | Status: DC

## 2018-07-13 MED ORDER — METHOCARBAMOL 500 MG PO TABS: 500.0000 mg | ORAL_TABLET | Freq: Three times a day (TID) | ORAL | Status: DC | PRN

## 2018-07-13 MED ORDER — ACETAMINOPHEN 325 MG PO TABS: 650.0000 mg | ORAL_TABLET | Freq: Four times a day (QID) | ORAL | Status: DC

## 2018-07-13 MED ORDER — ENSURE ENLIVE PO LIQD: 237.0000 mL | Freq: Two times a day (BID) | ORAL | 12 refills | Status: DC

## 2018-07-13 NOTE — Social Work (Signed)
Clinical Social Worker facilitated patient discharge including contacting patient family and facility to confirm patient discharge plans.  Clinical information faxed to facility and family agreeable with plan.  CSW arranged ambulance transport via [facility] to [facility]. RN to call [phone]  with report prior to discharge.  Clinical Social Worker will sign off for now as social work intervention is no longer needed. Please consult us again if new need arises.  Manmeet Arzola, MSW, LCSWA Clinical Social Worker [phone]  

## 2018-07-13 NOTE — TOC Initial Note (Addendum)
Transition of Care [facility]) - Initial/Assessment Note    Patient Details  Name: Jack Fuentes MRN: [id number] Date of Birth: 03-04-1946  Transition of Care [facility]) CM/SW Contact:    Marilu Favre, RN Phone Number: 07/13/2018, 11:49 AM  Clinical Narrative:      [facility] has offered patient a bed today , and covid test is not required. Patient has accepted [facility] offer.                Patient from home alone. He would like SNF prior to discharge to home. Patient is from [facility] , however he would like SNF in [place] , first choice is [place]   Expected Discharge Plan: [place] Barriers to Discharge: Continued Medical Work up   Patient Goals and CMS Choice Patient states their goals for this hospitalization and ongoing recovery are:: to go to SNF and get stronger  CMS Medicare.gov Compare Post Acute Care list provided to:: Patient Choice offered to / list presented to : Patient  Expected Discharge Plan and Services Expected Discharge Plan: [place] Choice: [place] arrangements for the past 2 months: Single Family Home                 DME Arranged: N/A DME Agency: NA       HH Arranged: NA HH Agency: NA        Prior Living Arrangements/Services Living arrangements for the past 2 months: Single Family Home Lives with:: Self Patient language and need for interpreter reviewed:: Yes Do you feel safe going back to the place where you live?: No(PAtient lives alone and wants to go to SNF prior to discharging home)            Criminal Activity/Legal Involvement Pertinent to Current Situation/Hospitalization: No - Comment as needed  Activities of Daily Living Home Assistive Devices/Equipment: Eyeglasses ADL Screening (condition at time of admission) Patient's cognitive ability adequate to safely complete daily activities?: Yes Is the patient deaf or have difficulty hearing?:  No Does the patient have difficulty seeing, even when wearing glasses/contacts?: No Does the patient have difficulty concentrating, remembering, or making decisions?: No Patient able to express need for assistance with ADLs?: Yes Does the patient have difficulty dressing or bathing?: No Independently performs ADLs?: Yes (appropriate for developmental age) Does the patient have difficulty walking or climbing stairs?: No Weakness of Legs: None Weakness of Arms/Hands: None  Permission Sought/Granted Permission sought to share information with : Case Manager             Permission granted to share info w Contact Information: [facility]  Emotional Assessment Appearance:: Appears stated age Attitude/Demeanor/Rapport: Engaged Affect (typically observed): Accepting Orientation: : Oriented to Self, Oriented to Place, Oriented to  Time, Oriented to Situation Alcohol / Substance Use: Not Applicable Psych Involvement: No (comment)  Admission diagnosis:  SBO (small bowel obstruction) ([place]) [K56.609] Patient Active Problem List   Diagnosis Date Noted  . Pressure injury of skin 07/10/2018  . Calcified mesenteric mass 07/08/2018  . SBO (small bowel obstruction) ([place]) 06/29/2018   PCP:  [name], PA-C Pharmacy:   [id number] [address], [place] [address] [place] [address] Phone: [phone] Fax: [phone]     Social Determinants of Health (SDOH) Interventions    Readmission Risk Interventions No flowsheet data found.

## 2018-07-13 NOTE — Progress Notes (Signed)
Report called to [place] at [facility]

## 2018-07-13 NOTE — NC FL2 (Signed)
[place] LEVEL OF CARE SCREENING TOOL     IDENTIFICATION  Patient Name: Jack Fuentes Birthdate: 11-11-1945 Sex: male Admission Date (Current Location): 06/29/2018  [facility] and [place] Number:  Herbalist and Address:  [facility] [place]. [facility], [place] [address], [place], [place] [address]      Provider Number: [id number]  Attending Physician Name and Address:  [name], MD  Relative Name and Phone Number:  Janetta Hora; friend; [phone]    Current Level of Care: Hospital Recommended Level of Care: [place] Prior Approval Number:    Date Approved/Denied:   PASRR Number: [id number]  Discharge Plan: SNF    Current Diagnoses: Patient Active Problem List   Diagnosis Date Noted  . Pressure injury of skin 07/10/2018  . Calcified mesenteric mass 07/08/2018  . SBO (small bowel obstruction) ([place]) 06/29/2018    Orientation RESPIRATION BLADDER Height & Weight     Self, Time, Situation, Place  Normal Continent Weight: 159 lb 13.3 oz (72.5 kg) Height:  5\' 10"  (177.8 cm)  BEHAVIORAL SYMPTOMS/MOOD NEUROLOGICAL BOWEL NUTRITION STATUS      Continent Diet(regular diet; thin liquids)  AMBULATORY STATUS COMMUNICATION OF NEEDS Skin   Limited Assist Verbally PU Stage and Appropriate Care, Surgical wounds(closed incision on abdomen; jp drain)   PU Stage 2 Dressing: (on sacrum with foam dressing)                   Personal Care Assistance Level of Assistance  Bathing, Feeding, Dressing Bathing Assistance: Limited assistance Feeding assistance: Independent Dressing Assistance: Limited assistance     Functional Limitations Info  Sight, Hearing, Speech Sight Info: Adequate Hearing Info: Adequate Speech Info: Adequate    SPECIAL CARE FACTORS FREQUENCY  OT (By licensed OT), PT (By licensed PT)     PT Frequency: 5x week OT Frequency: 5x week            Contractures Contractures Info: Not present     Additional Factors Info  Code Status, Allergies Code Status Info: Full Code Allergies Info: No Known Allergies           Current Medications (07/13/2018):  This is the current hospital active medication list Current Facility-Administered Medications  Medication Dose Route Frequency Provider Last Rate Last Dose  . 0.9 %  sodium chloride infusion (Manually program via Guardrails IV Fluids)   Intravenous Once Autumn Messing III, MD      . 0.9 %  sodium chloride infusion (Manually program via Guardrails IV Fluids)   Intravenous Once Isabelle Course, MD      . acetaminophen (TYLENOL) tablet 650 mg  650 mg Oral Q6H Meuth, Brooke A, PA-C   650 mg at 07/13/18 1143  . Chlorhexidine Gluconate Cloth 2 % PADS 6 each  6 each Topical Once Autumn Messing III, MD      . diphenhydrAMINE (BENADRYL) injection 12.5 mg  12.5 mg Intravenous Q6H PRN Autumn Messing III, MD       Or  . diphenhydrAMINE (BENADRYL) 12.5 MG/5ML elixir 12.5 mg  12.5 mg Oral Q6H PRN Autumn Messing III, MD      . enoxaparin (LOVENOX) injection 40 mg  40 mg Subcutaneous Q24H Focht, Jessica L, PA   40 mg at 07/13/18 0852  . feeding supplement (ENSURE ENLIVE) (ENSURE ENLIVE) liquid 237 mL  237 mL Oral BID BM [name], MD      . feeding supplement (PRO-STAT SUGAR FREE 64) liquid 30 mL  30 mL Oral BID [name], MD   30 mL at 07/12/18 1037  . menthol-cetylpyridinium (CEPACOL) lozenge 3 mg  1 lozenge Oral PRN Autumn Messing III, MD   3 mg at 07/12/18 0538  . methocarbamol (ROBAXIN) tablet 500 mg  500 mg Oral Q8H PRN Seawell, Jaimie A, DO      . morphine 2 MG/ML injection 1-2 mg  1-2 mg Intravenous Q2H PRN Meuth, Brooke A, PA-C      . multivitamin with minerals tablet 1 tablet  1 tablet Oral Daily [name], MD   1 tablet at 07/13/18 7943  . naloxone [facility]) injection 0.4 mg  0.4 mg Intravenous PRN Autumn Messing III, MD       And  . sodium chloride flush (NS) 0.9 % injection 9 mL  9 mL Intravenous PRN Autumn Messing III, MD      .  ondansetron [facility]) injection 4 mg  4 mg Intravenous Q6H PRN Autumn Messing III, MD   4 mg at 07/11/18 1252  . ondansetron (ZOFRAN) tablet 4 mg  4 mg Oral Q6H PRN Meuth, Brooke A, PA-C      . oxyCODONE (Oxy IR/ROXICODONE) immediate release tablet 5-10 mg  5-10 mg Oral Q4H PRN Meuth, Brooke A, PA-C      . phenol (CHLORASEPTIC) mouth spray 1 spray  1 spray Mouth/Throat PRN Focht, Jessica L, PA   1 spray at 07/06/18 0852  . psyllium (HYDROCIL/METAMUCIL) packet 1 packet  1 packet Oral Daily Meuth, Brooke A, PA-C   1 packet at 07/12/18 1037  . saccharomyces boulardii (FLORASTOR) capsule 250 mg  250 mg Oral BID Meuth, Brooke A, PA-C   250 mg at 07/13/18 2761  . sodium chloride flush (NS) 0.9 % injection 10-40 mL  10-40 mL Intracatheter Q12H Autumn Messing III, MD   10 mL at 07/12/18 2002  . sodium chloride flush (NS) 0.9 % injection 10-40 mL  10-40 mL Intracatheter PRN Autumn Messing III, MD   10 mL at 07/08/18 2135  . sodium chloride flush (NS) 0.9 % injection 3 mL  3 mL Intravenous Q12H Autumn Messing III, MD   3 mL at 07/13/18 4709  . sodium chloride flush (NS) 0.9 % injection 3 mL  3 mL Intravenous PRN Jovita Kussmaul, MD         Discharge Medications: Please see discharge summary for a list of discharge medications.  Relevant Imaging Results:  Relevant Lab Results:   Additional Information [id number] [place], [place]

## 2018-07-13 NOTE — Progress Notes (Signed)
   Subjective:  Had some difficulty sleeping overnight, which has been because of the diarrhea.  But the diarrhea has greatly improved now and only occurring every 2-3hrs. He is interested in trying melatonin tonight qhs. Eating well, not having any nausea. Pain is controlled. He states he is still open to inpatient rehab. He does not want to take B12 as he feels that in the past it made him tired and dried out his skin. He would also like to defer iron infusion and will f/u with his primary doctor and may consider taking po iron later.   Objective:  Vital signs in last 24 hours: Vitals:   07/12/18 0352 07/12/18 1448 07/12/18 2059 07/13/18 0555  BP: 115/71 121/81 107/73 108/79  Pulse: 85 (!) 101 91 87  Resp: 18 (!) 24 18 18   Temp: 98 F (36.7 C) 97.8 F (36.6 C) 98.2 F (36.8 C) 97.7 F (36.5 C)  TempSrc: Oral Oral Oral Oral  SpO2: 97% 100% 99% 98%  Weight:      Height:       Constitution: NAD, supine in bed  Respiratory: non-labored breathing, on room air Abdominal: +BS, non-distended, soft Neuro: a&o, normal affect Skin: abdominal incision c/d/i; drain shows minimal amount serous fluid   Assessment/Plan:  Principal Problem:   SBO (small bowel obstruction) (HCC) Active Problems:   Calcified mesenteric mass   Pressure injury of skin   Jack Fuentes is a 73 year old 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   #Small bowel obstructions/p resection of calcified mesenteric mass: - POD#10 - re-consult CIR, he may have progressed to [facility] but CIR will speak with him again  - prn po oxy and po tylenol, po robaxin  -  PT/OT to help mobilize, recommending CIR but may progress to [facility] - staples will be removed 5/10, keep drain in for now   #Hypertension: - BPstable - holding home valsartan  #Diffuse Large B cell Lymphoma: - appreciate oncology consultation - outpt f/u    VTE: lovenox IVF: none Diet: regular Code: full   Dispo: Anticipated discharge pending CIR placement. He is medically ready for discharge from our standpoint and surgery is in agreement. If he has made too much progress for CIR, we will discuss HH  Jack Course, MD 07/13/2018, 7:48 AM Pager: [phone]

## 2018-07-13 NOTE — Progress Notes (Signed)
Inpatient Rehabilitation Admissions Coordinator  I discussed with Aimee, PTA and patient min guard to supervision with PT. Not in need of an intensive inpt rehab admission at this time. I met with patient at bedside and he is aware. He lives alone and due to his continued stomach issues with diarrhea, he is requesting SNF rehab for a few weeks. I have notified Dr. Vogel, RN CM, Heather and Isabel, SW. We will sign off at this time.  Barbara Boyette, RN, MSN Rehab Admissions Coordinator [phone] 07/13/2018 10:39 AM  

## 2018-07-13 NOTE — Progress Notes (Signed)
  Date: 07/13/2018  Patient name: Jack Fuentes  Medical record number: [id number]  Date of birth: 06/28/1945   I have seen and evaluated this patient and I have discussed the plan of care with the house staff. Please see their note for complete details. I concur with their findings with the following additions/corrections: Jack Fuentes was seen this morning on team rounds.  He agrees that he is making progress.  He is medically stable to be transferred to CIR once that is possible.  [name], MD 07/13/2018, 9:34 AM

## 2018-07-13 NOTE — Progress Notes (Signed)
[facility] Progress Note  10 Days Post-Op  Subjective: CC-  Feeling more tired today. Denies increased abdominal pain. Diarrhea persistent but decreasing, states that it is about q2 hours now. Denies n/v. Eating about 50% of his meals, which he reports is what he was eating prior to surgery.   Objective: Vital signs in last 24 hours: Temp:  [97.7 F (36.5 C)-98.2 F (36.8 C)] 97.7 F (36.5 C) (05/08 0555) Pulse Rate:  [87-101] 87 (05/08 0555) Resp:  [18-24] 18 (05/08 0555) BP: (107-121)/(73-81) 108/79 (05/08 0555) SpO2:  [98 %-100 %] 98 % (05/08 0555) Last BM Date: 07/12/18  Intake/Output from previous day: 05/07 0701 - 05/08 0700 In: 20 [I.V.:20] Out: 1310 [Urine:600; Drains:185; Stool:525] Intake/Output this shift: Total I/O In: -  Out: 270 [Drains:70; Stool:200]  PE: Gen: Alert, NAD, pleasant HEENT: EOM's intact, pupils equal and round Pulm: effort normal Abd: Soft, ND,+BS,nontender, midline incision cdi with staples intact and no erythema or drainage, JP drain with serous output Skin:warm and dry   Lab Results:  Recent Labs    07/12/18 0415  WBC 8.1  HGB 8.4*  HCT 26.9*  PLT 277   BMET Recent Labs    07/12/18 0415  NA 135  K 3.8  CL 109  CO2 19*  GLUCOSE 91  BUN 10  CREATININE 0.81  CALCIUM 7.9*   PT/INR No results for input(s): LABPROT, INR in the last 72 hours. CMP     Component Value Date/Time   NA 135 07/12/2018 0415   K 3.8 07/12/2018 0415   CL 109 07/12/2018 0415   CO2 19 (L) 07/12/2018 0415   GLUCOSE 91 07/12/2018 0415   BUN 10 07/12/2018 0415   CREATININE 0.81 07/12/2018 0415   CALCIUM 7.9 (L) 07/12/2018 0415   PROT 4.5 (L) 07/12/2018 0415   ALBUMIN 1.7 (L) 07/12/2018 0415   AST 38 07/12/2018 0415   ALT 53 (H) 07/12/2018 0415   ALKPHOS 76 07/12/2018 0415   BILITOT 0.4 07/12/2018 0415   GFRNONAA >60 07/12/2018 0415   GFRAA >60 07/12/2018 0415   Lipase     Component Value Date/Time   LIPASE 54 (H)  06/29/2018 1221       Studies/Results: No results found.  Anti-infectives: Anti-infectives (From admission, onward)   Start     Dose/Rate Route Frequency Ordered Stop   07/03/18 1500  cefoTEtan (CEFOTAN) 2 g in sodium chloride 0.9 % 100 mL IVPB     2 g 200 mL/hr over 30 Minutes Intravenous To Surgery 07/03/18 1455 07/03/18 1513   07/03/18 1345  cefoTEtan in Dextrose 5% (CEFOTAN) IVPB 2 g  Status:  Discontinued     2 g 100 mL/hr over 30 Minutes Intravenous On call to O.R. 07/03/18 1312 07/03/18 1455       Assessment/Plan Hx ofnon-Hodgkin's lymphoma-s/p radiation and chemotherapy Hypertension Hyperlipidemia Atrophic left kidney Acute on chronic kidney disease Hyponatremia/hypokalemia- replacement per primary  SBO S/P ex lap, LOA, small bowel resection with resection of calcified mesenteric mass, duodenorraphy, excision ectopic calcification abdominal wall, Dr. Marlou Starks, 04/28 - POD10 - drain with serous output, decreasing in amount but still high - surgical path:DIFFUSE LARGE B-CELL LYMPHOMA - appreciate oncology consult, planning outpatient f/u -tolerating diet, continues to have diarrhea but it is slowly improving  FEN:reg diet, Ensure ID: Cefotetan pre-op only DVT: lovenox, SCD's Foley: DC 05/01 Follow-up:Dr. Marlou Starks  Plan:Doing well from surgical standpoint, stable for d/c to CIR when bed available. Continue to encourage PO intake. Staples can be  removed 5/10. Keep drain in for now, will remove when output decreases.   LOS: 14 days    Wellington Hampshire , [facility] 07/13/2018, 8:10 AM Pager: [phone] Mon-Thurs 7:00 am-4:30 pm Fri 7:00 am -11:30 AM Sat-Sun 7:00 am-11:30 am

## 2018-07-13 NOTE — Progress Notes (Cosign Needed)
Physical Therapy Treatment Patient Details Name: Jack CORTER MRN: [id number] DOB: 1945/11/05 Today's Date: 07/13/2018    History of Present Illness Pt is a 73 y.o. male admitted 06/29/18 with nausea/vomiting. Abdominal CT showed severe SBO. S/p exlap and resection of calcified mesenteric mass on 4/28. 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    PT Comments    Pt performed gait training and LE strengthening.  He continues to present with weakness and required freuqent rest breaks due to fatigue.  Pt is functioning too well to go to CIR but has no family support at home.  Based on his lack of support will update recommendations to SNF at this time.     Follow Up Recommendations  Supervision for mobility/OOB;SNF     Equipment Recommendations  Rolling walker with 5" wheels    Recommendations for Other Services Rehab consult;OT consult     Precautions / Restrictions Precautions Precautions: Fall Restrictions Weight Bearing Restrictions: No    Mobility  Bed Mobility Overal bed mobility: Needs Assistance Bed Mobility: Supine to Sit;Sit to Supine     Supine to sit: Supervision;HOB elevated Sit to supine: Supervision;HOB elevated   General bed mobility comments: supervision for safety  Transfers Overall transfer level: Needs assistance Equipment used: Rolling walker (2 wheeled) Transfers: Sit to/from Stand Sit to Stand: Supervision         General transfer comment: guarding for safety  Ambulation/Gait Ambulation/Gait assistance: Min guard Gait Distance (Feet): 250 Feet Assistive device: Rolling walker (2 wheeled) Gait Pattern/deviations: Step-through pattern;Decreased stride length;Trunk flexed Gait velocity: Decreased   General Gait Details: Cues for head control and forward gaze, cues for B foot clearance.   Stairs             Wheelchair Mobility    Modified Rankin (Stroke Patients Only)        Balance Overall balance assessment: Needs assistance Sitting-balance support: Feet supported Sitting balance-Leahy Scale: Good Sitting balance - Comments: Requiring assist for lower body ADLs      Standing balance-Leahy Scale: Fair Standing balance comment: Reliant on UE support                            Cognition Arousal/Alertness: Awake/alert Behavior During Therapy: WFL for tasks assessed/performed Overall Cognitive Status: Within Functional Limits for tasks assessed                                 General Comments: more alert and able to progress mobility      Exercises General Exercises - Lower Extremity Ankle Circles/Pumps: AROM;Both;20 reps;Supine Quad Sets: AROM;Both;10 reps;Supine Long Arc Quad: 10 reps Heel Slides: AROM;10 reps;Supine;Both Hip ABduction/ADduction: Both;Supine;AROM;10 reps Straight Leg Raises: AROM;10 reps;Both;Supine;Limitations Straight Leg Raises Limitations: B extensor lag Hip Flexion/Marching: 10 reps    General Comments        Pertinent Vitals/Pain Pain Assessment: Faces Faces Pain Scale: Hurts little more Pain Location: Abdomen Pain Descriptors / Indicators: Discomfort Pain Intervention(s): Monitored during session;Repositioned    Home Living                      Prior Function            PT Goals (current goals can now be found in the care plan section) Acute Rehab PT Goals Patient Stated Goal: To get stronger Potential to Achieve Goals: Good Progress  towards PT goals: Progressing toward goals    Frequency    Min 3X/week      PT Plan Discharge plan needs to be updated    Co-evaluation              AM-PAC PT "6 Clicks" Mobility   Outcome Measure  Help needed turning from your back to your side while in a flat bed without using bedrails?: A Little Help needed moving from lying on your back to sitting on the side of a flat bed without using bedrails?: A Little Help  needed moving to and from a bed to a chair (including a wheelchair)?: A Little Help needed standing up from a chair using your arms (e.g., wheelchair or bedside chair)?: A Little Help needed to walk in hospital room?: A Little Help needed climbing 3-5 steps with a railing? : A Little 6 Click Score: 18    End of Session Equipment Utilized During Treatment: Gait belt Activity Tolerance: Patient limited by fatigue Patient left: in chair;with call bell/phone within reach;with chair alarm set Nurse Communication: Mobility status PT Visit Diagnosis: Other abnormalities of gait and mobility (R26.89);Pain Pain - part of body: (abdomen)     Time: 4097-3532 PT Time Calculation (min) (ACUTE ONLY): 18 min  Charges:  $Gait Training: 8-22 mins                     Governor Rooks, PTA Acute Rehabilitation Services Pager [phone] Office [phone]     Junice Fei Eli Hose 07/13/2018, 4:37 PM

## 2018-07-13 NOTE — TOC Transition Note (Signed)
Transition of Care [facility]) - CM/SW Discharge Note   Patient Details  Name: STRIDER VALLANCE MRN: [id number] Date of Birth: 14-Sep-1945  Transition of [facility]) CM/SW Contact:  Alexander Mt, [place] Phone Number: 07/13/2018, 1:12 PM   Clinical Narrative:    Plan for d/c to [facility], await dc summary.  PASRR received, will need any controlled prescriptions signed and printed.  Final next level of care: Skilled Nursing Facility Barriers to Discharge: Barriers Resolved   Patient Goals and CMS Choice Patient states their goals for this hospitalization and ongoing recovery are:: to go to SNF and get stronger  CMS Medicare.gov Compare Post Acute Care list provided to:: Patient Choice offered to / list presented to : Patient  Discharge Placement PASRR number recieved: 07/13/18            Patient chooses bed at: [facility] Patient to be transferred to facility by: [place] Name of family member notified: pt responsible for self, a&ox4 Patient and family notified of of transfer: 07/13/18  Discharge Plan and Services     Post Acute Care Choice: [place]          DME Arranged: N/A DME Agency: NA       HH Arranged: NA HH Agency: NA        Social Determinants of Health (SDOH) Interventions     Readmission Risk Interventions No flowsheet data found.

## 2018-07-13 NOTE — Progress Notes (Signed)
Occupational Therapy Treatment Patient Details Name: Jack Fuentes MRN: [id number] DOB: [birth year] Today's Date: 07/13/2018    History of present illness Pt is a 73 y.o. male admitted 06/29/18 with nausea/vomiting. Abdominal CT showed severe SBO. S/p exlap and resection of calcified mesenteric mass on 4/28. 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   OT comments  Pt progressing toward goals. Pt no longer candidate for CIR. Due to lack of caregiver support, pt agrees SNF would be appropriate discharge plan. He anticipates discharge to SNF later this afternoon. Will continue to follow acutely.  Follow Up Recommendations  SNF;Supervision/Assistance - 24 hour    Equipment Recommendations  Other (comment)(defer to next venue)    Recommendations for Other Services      Precautions / Restrictions Precautions Precautions: Fall       Mobility Bed Mobility Overal bed mobility: Needs Assistance Bed Mobility: Supine to Sit;Sit to Supine     Supine to sit: Supervision;HOB elevated Sit to supine: Supervision;HOB elevated   General bed mobility comments: supervision for safety  Transfers Overall transfer level: Needs assistance Equipment used: None Transfers: Sit to/from Stand Sit to Stand: Min guard         General transfer comment: guarding for safety    Balance                                           ADL either performed or assessed with clinical judgement   ADL Overall ADL's : Needs assistance/impaired     Grooming: Oral care;Set up;Bed level           Upper Body Dressing : Supervision/safety;Sitting   Lower Body Dressing: Minimal assistance;Sit to/from stand   Toilet Transfer: Min guard;Ambulation;Comfort height toilet   Toileting- Clothing Manipulation and Hygiene: Min guard;Sit to/from stand       Functional mobility during ADLs: Min guard General ADL Comments: Pt requesting  to get dressed before he discharges later this afternoon. Min assist to thread LEs through pants. Close guarding while pt ambulates in room without RW.     Vision       Perception     Praxis      Cognition Arousal/Alertness: Awake/alert Behavior During Therapy: WFL for tasks assessed/performed Overall Cognitive Status: Within Functional Limits for tasks assessed                                          Exercises     Shoulder Instructions       General Comments      Pertinent Vitals/ Pain       Pain Assessment: Faces Faces Pain Scale: Hurts little more Pain Location: Abdomen Pain Descriptors / Indicators: Discomfort Pain Intervention(s): Repositioned;Monitored during session  Home Living                                          Prior Functioning/Environment              Frequency  Min 3X/week        Progress Toward Goals  OT Goals(current goals can now be found in the care plan section)  Progress towards OT goals: Progressing toward goals  Acute Rehab OT Goals Patient Stated Goal: To get stronger OT Goal Formulation: With patient Time For Goal Achievement: 07/20/18 Potential to Achieve Goals: Good ADL Goals Pt Will Perform Upper Body Bathing: with supervision;sitting Pt Will Perform Lower Body Bathing: with min guard assist;sit to/from stand Pt Will Perform Upper Body Dressing: with supervision;sitting Pt Will Perform Lower Body Dressing: with min guard assist;sit to/from stand;with adaptive equipment Pt Will Transfer to Toilet: with min guard assist;stand pivot transfer;bedside commode Pt Will Perform Toileting - Clothing Manipulation and hygiene: with min guard assist;sit to/from stand Additional ADL Goal #1: Pt will complete bed mobility tasks with supervision as precursor for EOB ADLs.  Plan Discharge plan needs to be updated    Co-evaluation                 AM-PAC OT "6 Clicks" Daily Activity      Outcome Measure   Help from another person eating meals?: None Help from another person taking care of personal grooming?: A Little Help from another person toileting, which includes using toliet, bedpan, or urinal?: A Little Help from another person bathing (including washing, rinsing, drying)?: A Little Help from another person to put on and taking off regular upper body clothing?: A Little Help from another person to put on and taking off regular lower body clothing?: A Little 6 Click Score: 19    End of Session    OT Visit Diagnosis: Unsteadiness on feet (R26.81);Muscle weakness (generalized) (M62.81);Pain   Activity Tolerance Patient tolerated treatment well   Patient Left in bed;with call bell/phone within reach   Nurse Communication Mobility status        Time: 2992-4268 OT Time Calculation (min): 25 min  Charges: OT General Charges $OT Visit: 1 Visit OT Treatments $Self Care/Home Management : 23-37 mins     Darrol Jump OTR/L 07/13/2018, 4:00 PM

## 2018-07-16 LAB — CBC AND DIFFERENTIAL
HCT: 29 — AB (ref 41–53)
Hemoglobin: 9.7 — AB (ref 13.5–17.5)
Platelets: 376 (ref 150–399)
WBC: 10.8

## 2018-07-18 ENCOUNTER — Inpatient Hospital Stay: Payer: Medicare Other

## 2018-07-18 ENCOUNTER — Telehealth: Payer: Self-pay | Admitting: Hematology

## 2018-07-18 ENCOUNTER — Encounter: Payer: Self-pay | Admitting: Internal Medicine

## 2018-07-18 ENCOUNTER — Inpatient Hospital Stay: Payer: Medicare Other | Attending: Hematology | Admitting: Hematology

## 2018-07-18 ENCOUNTER — Other Ambulatory Visit: Payer: Self-pay

## 2018-07-18 ENCOUNTER — Non-Acute Institutional Stay (SKILLED_NURSING_FACILITY): Payer: Medicare Other | Admitting: Internal Medicine

## 2018-07-18 VITALS — BP 103/72 | HR 60 | Temp 97.6°F | Resp 17 | Ht 70.0 in | Wt 148.2 lb

## 2018-07-18 DIAGNOSIS — N179 Acute kidney failure, unspecified: Secondary | ICD-10-CM | POA: Diagnosis not present

## 2018-07-18 DIAGNOSIS — R0602 Shortness of breath: Secondary | ICD-10-CM

## 2018-07-18 DIAGNOSIS — Z9221 Personal history of antineoplastic chemotherapy: Secondary | ICD-10-CM | POA: Insufficient documentation

## 2018-07-18 DIAGNOSIS — C8333 Diffuse large B-cell lymphoma, intra-abdominal lymph nodes: Secondary | ICD-10-CM

## 2018-07-18 DIAGNOSIS — D649 Anemia, unspecified: Secondary | ICD-10-CM | POA: Diagnosis not present

## 2018-07-18 DIAGNOSIS — K668 Other specified disorders of peritoneum: Secondary | ICD-10-CM | POA: Diagnosis not present

## 2018-07-18 DIAGNOSIS — Z79899 Other long term (current) drug therapy: Secondary | ICD-10-CM | POA: Insufficient documentation

## 2018-07-18 DIAGNOSIS — Z95828 Presence of other vascular implants and grafts: Secondary | ICD-10-CM

## 2018-07-18 DIAGNOSIS — I447 Left bundle-branch block, unspecified: Secondary | ICD-10-CM | POA: Insufficient documentation

## 2018-07-18 DIAGNOSIS — K56609 Unspecified intestinal obstruction, unspecified as to partial versus complete obstruction: Secondary | ICD-10-CM | POA: Diagnosis not present

## 2018-07-18 DIAGNOSIS — K909 Intestinal malabsorption, unspecified: Secondary | ICD-10-CM

## 2018-07-18 DIAGNOSIS — I951 Orthostatic hypotension: Secondary | ICD-10-CM

## 2018-07-18 DIAGNOSIS — T451X5A Adverse effect of antineoplastic and immunosuppressive drugs, initial encounter: Secondary | ICD-10-CM

## 2018-07-18 DIAGNOSIS — N184 Chronic kidney disease, stage 4 (severe): Secondary | ICD-10-CM | POA: Insufficient documentation

## 2018-07-18 DIAGNOSIS — G620 Drug-induced polyneuropathy: Secondary | ICD-10-CM

## 2018-07-18 DIAGNOSIS — R634 Abnormal weight loss: Secondary | ICD-10-CM | POA: Insufficient documentation

## 2018-07-18 LAB — CMP (CANCER CENTER ONLY)
ALT: 35 U/L (ref 0–44)
AST: 24 U/L (ref 15–41)
Albumin: 2.5 g/dL — ABNORMAL LOW (ref 3.5–5.0)
Alkaline Phosphatase: 108 U/L (ref 38–126)
Anion gap: 8 (ref 5–15)
BUN: 10 mg/dL (ref 8–23)
CO2: 19 mmol/L — ABNORMAL LOW (ref 22–32)
Calcium: 8.4 mg/dL — ABNORMAL LOW (ref 8.9–10.3)
Chloride: 109 mmol/L (ref 98–111)
Creatinine: 0.91 mg/dL (ref 0.61–1.24)
GFR, Est AFR Am: >60 mL/min (ref >60)
GFR, Estimated: >60 mL/min (ref >60)
Glucose, Bld: 89 mg/dL (ref 70–99)
Potassium: 3.3 mmol/L — ABNORMAL LOW (ref 3.5–5.1)
Sodium: 136 mmol/L (ref 135–145)
Total Bilirubin: 0.2 mg/dL — ABNORMAL LOW (ref 0.3–1.2)
Total Protein: 6.1 g/dL — ABNORMAL LOW (ref 6.5–8.1)

## 2018-07-18 LAB — CBC WITH DIFFERENTIAL/PLATELET
Abs Immature Granulocytes: 0.04 10*3/uL (ref 0.00–0.07)
Basophils Absolute: 0 10*3/uL (ref 0.0–0.1)
Basophils Relative: 0 %
Eosinophils Absolute: 0.7 10*3/uL — ABNORMAL HIGH (ref 0.0–0.5)
Eosinophils Relative: 7 %
HCT: 32.1 % — ABNORMAL LOW (ref 39.0–52.0)
Hemoglobin: 10.2 g/dL — ABNORMAL LOW (ref 13.0–17.0)
Immature Granulocytes: 0 %
Lymphocytes Relative: 11 %
Lymphs Abs: 1 10*3/uL (ref 0.7–4.0)
MCH: 27.9 pg (ref 26.0–34.0)
MCHC: 31.8 g/dL (ref 30.0–36.0)
MCV: 87.7 fL (ref 80.0–100.0)
Monocytes Absolute: 0.8 10*3/uL (ref 0.1–1.0)
Monocytes Relative: 9 %
Neutro Abs: 7 10*3/uL (ref 1.7–7.7)
Neutrophils Relative %: 73 %
Platelets: 417 10*3/uL — ABNORMAL HIGH (ref 150–400)
RBC: 3.66 MIL/uL — ABNORMAL LOW (ref 4.22–5.81)
RDW: 13.9 % (ref 11.5–15.5)
WBC: 9.5 10*3/uL (ref 4.0–10.5)
nRBC: 0 % (ref 0.0–0.2)

## 2018-07-18 LAB — FERRITIN: Ferritin: 37 ng/mL (ref 24–336)

## 2018-07-18 LAB — LACTATE DEHYDROGENASE: LDH: 155 U/L (ref 98–192)

## 2018-07-18 LAB — IRON AND TIBC
Iron: 22 ug/dL — ABNORMAL LOW (ref 42–163)
Saturation Ratios: 7 % — ABNORMAL LOW (ref 20–55)
TIBC: 328 ug/dL (ref 202–409)
UIBC: 305 ug/dL (ref 117–376)

## 2018-07-18 LAB — VITAMIN B12: Vitamin B-12: 236 pg/mL (ref 180–914)

## 2018-07-18 MED ORDER — HEPARIN SOD (PORK) LOCK FLUSH 100 UNIT/ML IV SOLN
500.0000 [IU] | Freq: Once | INTRAVENOUS | Status: AC
  Administered 2018-07-18: 250 [IU] via INTRAVENOUS
  Filled 2018-07-18: qty 5

## 2018-07-18 MED ORDER — SODIUM CHLORIDE 0.9% FLUSH
10.0000 mL | INTRAVENOUS | Status: DC | PRN
  Administered 2018-07-18: 10 mL via INTRAVENOUS
  Filled 2018-07-18: qty 10

## 2018-07-18 NOTE — Assessment & Plan Note (Addendum)
Oncology follow-up with Dr. Irene Limbo 07/18/2018. Chemo pending PET scan 4-5 weeks post op

## 2018-07-18 NOTE — Assessment & Plan Note (Signed)
Blood pressure is well controlled off the ARB; continue monitor.

## 2018-07-18 NOTE — Progress Notes (Signed)
HEMATOLOGY/ONCOLOGY CLINIC NOTE  Date of Service: 07/18/2018  Patient Care Team: Mickel Duhamel as PCP - General (Physician Assistant)  CHIEF COMPLAINTS/PURPOSE OF CONSULTATION:  Diffuse large B-Cell Lymphoma  Oncologic History:  Jack Fuentes was diagnosed with Stage IV Diffuse Large B-Cell Lymphoma with a retroperitoneum biopsy on September 04, 1998 and was treated at [facility]. He received  4 cycles of CHOP followed by 2 cycles of R-CHOP, then R-ICE, and then 25 fractions of involved site radiation. Has been followed by oncology at [facility], [place], and [place] with PET scan every few years until 2017.  HISTORY OF PRESENTING ILLNESS:   Jack Fuentes is a wonderful 73 y.o. male who has been referred to [place] by [name], PA-C for evaluation and management of Diffuse Large B-Cell Lymphoma. The pt reports that he is doing well overall.   Prior to today's visit the pt presented to the ED on 06/29/18 and was admitted until 07/13/18. He initially presented with nausea and vomiting for 12 days, and workup revealed a high grade small bowel obstruction on CT imaging as noted below. The pt had a exploratory laparotomy, lysis adhesion and small bowel resection with resection of calcified mesenteric mass. The pathology of the resected mass revealed Diffuse Large B-Cell Lymphoma.  The pt reports that he has been healing well after his recent surgery and notes that his incision has been healing well and he still has his surgical drain and will have his staples removed tomorrow. Notes emptying drain with clear fluid about twice a day. He notes that on 06/19/18 he began throwing up for a week. The pt notes that he had not had bowel obstruction symptoms in the past. He notes that he has had episodes of vomiting a few times a year for the past 4-5 years. He notes that he has had some over the last couple days, and is now eating solid food. He notes that he is having a bowel movement every 3  hours, and is not taking anything for this. He notes that his appetite is good and he is beginning to eat better.  He notes that his baseline is 175 pounds, and today weighs 148 pounds today. He notes that he lives independently at home alone. He is currently at a skilled nursing facility for rehab following his recent surgery. He notes that prior to these recent changes, he maintained a pretty active lifestyle with gym visits and frequent swimming. He notes that he feels he is currently at 50% of his baseline.  He denies noticing any lumps or bumps, skin rashes, and denies fevers, chills or night sweats. He denies any chest symptoms, SOB or CP.  He takes Crestor and Gabapentin.  The pt notes that he initially presented in 2000 for left flank pain, and notes that this pain had begun a few months prior. 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 He received CHOP for 6 cycles and Rituxan with the last two cycles, followed by R-ICE. He notes his initial tumor shrunk from 12cm to 5cm after CHOP, which prompted R-ICE treatment. He then received 25 fractions of involved site radiation. The pt notes that he then began taking tumeric, and that his soft tissue mass shrunk to 1cm about 1-2 years after RT.  Of note prior to the patient's visit today, pt has had a CT A/P completed on 06/29/18 with results revealing "High-grade small bowel obstruction.  Massive distension of the stomach, duodenum and proximal small bowel. Transition point is associated with the large mesenteric calcifications in the mid abdomen. Distal small bowel is decompressed. Mild small bowel wall thickening centered around the mesenteric calcifications. 2. Massive central mesenteric calcifications that have minimally changed since 2008. Based on the history of lymphoma, findings are most compatible with treated disease. No evidence for lymphoma recurrence. 3. Chronic atrophy of the left  kidney with dilatation of the left renal collecting system, no significant change since 2008. 4.  Aortic Atherosclerosis 5. Prostate enlargement."  Most recent lab results (07/12/18) of CBC and CMP is as follows: all values are WNL except for RBC at 3.02, HGB at 8.4, HCT at 26.9, CO2 at 19, Calcium at 7.9, Total Protein at 4.5, Albumin at 1.7, ALT at 53.  On review of systems, pt reports diarrhea, weight loss, improved eating, improving energy levels, and denies fevers, chills, night sweats, CP, SOB, noticing any new lumps or bumps, and any other symptoms.   On PMHx the pt reports Stage IV DLBCL diagnosed in 2000 s/p R-CHOP, R-ICE, and RT. Left bundle branch block. 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 Low testosterone. Prostate hypertrophy. On Social Hx the pt reports occasional alcohol consumption, and denies ever smoking cigarettes. Previously worked as a Materials engineer, and Comptroller in the [employer]. On Family Hx the pt reports sister with multiple myeloma.   MEDICAL HISTORY:  Past Medical History:  Diagnosis Date   Cancer [facility])    History of left bundle branch block (LBBB)    SBO (small bowel obstruction) ([place]) 06/2018    SURGICAL HISTORY: Past Surgical History:  Procedure Laterality Date   EYE SURGERY     LAPAROTOMY N/A 07/03/2018   Procedure: EXPLORATORY LAPAROTOMY, LYSIS OF ADHESION, BOWEL RESECTION, RESECTION MESTASTATIC TUMOR;  Surgeon: Jovita Kussmaul, MD;  Location: [facility];  Service: General;  Laterality: N/A;   NASAL FRACTURE SURGERY      SOCIAL HISTORY: Social History   Socioeconomic History   Marital status: Divorced    Spouse name: Not on file   Number of children: Not on file   Years of education: Not on file   Highest education level: Not on file  Occupational History   Not on file  Social Needs   Financial resource strain: Not on file   Food insecurity:    Worry: Not on file    Inability: Not on file   Transportation  needs:    Medical: Not on file    Non-medical: Not on file  Tobacco Use   Smoking status: Never Smoker   Smokeless tobacco: Never Used  Substance and Sexual Activity   Alcohol use: Not Currently   Drug use: Not Currently   Sexual activity: Not on file  Lifestyle   Physical activity:    Days per week: Not on file    Minutes per session: Not on file   Stress: Not on file  Relationships   Social connections:    Talks on phone: Not on file    Gets together: Not on file    Attends religious service: Not on file    Active member of club or organization: Not on file    Attends meetings of clubs or organizations: Not on file    Relationship status: Not on file   Intimate partner violence:    Fear of current or ex partner: Not on file    Emotionally abused: Not on file  Physically abused: Not on file    Forced sexual activity: Not on file  Other Topics Concern   Not on file  Social History Narrative   Not on file    FAMILY HISTORY: Family History  Problem Relation Age of Onset   Heart disease Father    Multiple myeloma Sister     ALLERGIES:  is allergic to levofloxacin and midazolam.  MEDICATIONS:  Current Outpatient Medications  Medication Sig Dispense Refill   acetaminophen (TYLENOL) 325 MG tablet Take 2 tablets (650 mg total) by mouth every 6 (six) hours.     clopidogrel (PLAVIX) 75 MG tablet Take 75 mg by mouth daily.     Ensure (ENSURE) Take 237 mLs by mouth 3 (three) times daily between meals.     gabapentin (NEURONTIN) 600 MG tablet Take 600 mg by mouth 2 (two) times daily.      guaiFENesin (ROBITUSSIN) 100 MG/5ML liquid Take 200 mg by mouth every 4 (four) hours as needed for cough.     methocarbamol (ROBAXIN) 500 MG tablet Take 1 tablet (500 mg total) by mouth every 8 (eight) hours as needed for muscle spasms.     Multiple Vitamin (MULTIVITAMIN WITH MINERALS) TABS tablet Take 1 tablet by mouth daily.     psyllium (HYDROCIL/METAMUCIL) 95 %  PACK Take 1 packet by mouth daily. 240 each    rosuvastatin (CRESTOR) 10 MG tablet Take 10 mg by mouth daily.     saccharomyces boulardii (FLORASTOR) 250 MG capsule Take 1 capsule (250 mg total) by mouth 2 (two) times daily.     Turmeric 500 MG CAPS Take 500 mg by mouth 2 (two) times daily.     No current facility-administered medications for this visit.     REVIEW OF SYSTEMS:    10 Point review of Systems was done is negative except as noted above.  PHYSICAL EXAMINATION: ECOG PERFORMANCE STATUS: 2 - Symptomatic, <50% confined to bed  . Vitals:   07/18/18 1030  BP: 103/72  Pulse: 60  Resp: 17  Temp: 97.6 F (36.4 C)  SpO2: 100%   Filed Weights   07/18/18 1030  Weight: 148 lb 3.2 oz (67.2 kg)   .Body mass index is 21.26 kg/m.  GENERAL:alert, in no acute distress and comfortable SKIN: no acute rashes, no significant lesions EYES: conjunctiva are pink and non-injected, sclera anicteric OROPHARYNX: MMM, no exudates, no oropharyngeal erythema or ulceration NECK: supple, no JVD LYMPH:  no palpable lymphadenopathy in the cervical, axillary or inguinal regions LUNGS: clear to auscultation b/l with normal respiratory effort HEART: regular rate & rhythm ABDOMEN:  normoactive bowel sounds , non tender, not distended. Extremity: no pedal edema PSYCH: alert & oriented x 3 with fluent speech NEURO: no focal motor/sensory deficits  LABORATORY DATA:  I have reviewed the data as listed  . CBC Latest Ref Rng & Units 07/16/2018 07/12/2018 07/10/2018  WBC - 10.8 8.1 7.7  Hemoglobin 13.5 - 17.5 9.7(A) 8.4(L) 8.3(L)  Hematocrit 41 - 53 29(A) 26.9(L) 26.2(L)  Platelets 150 - 399 376 277 212    . CMP Latest Ref Rng & Units 07/12/2018 07/10/2018 07/09/2018  Glucose 70 - 99 mg/dL 91 103(H) 121(H)  BUN 8 - 23 mg/dL '10 15 20  ' Creatinine 0.61 - 1.24 mg/dL 0.81 0.88 0.89  Sodium 135 - 145 mmol/L 135 136 136  Potassium 3.5 - 5.1 mmol/L 3.8 4.0 4.1  Chloride 98 - 111 mmol/L 109 104 104  CO2  22 - 32 mmol/L 19(L) 23 25  Calcium 8.9 - 10.3 mg/dL 7.9(L) 8.0(L) 7.8(L)  Total Protein 6.5 - 8.1 g/dL 4.5(L) - 4.1(L)  Total Bilirubin 0.3 - 1.2 mg/dL 0.4 - 0.5  Alkaline Phos 38 - 126 U/L 76 - 84  AST 15 - 41 U/L 38 - 54(H)  ALT 0 - 44 U/L 53(H) - 53(H)    07/03/18 Biopsy:    09/04/1998 Retroperitoneum biopsy:     RADIOGRAPHIC STUDIES: I have personally reviewed the radiological images as listed and agreed with the findings in the report. Ct Abdomen Pelvis W Contrast  Result Date: 06/29/2018 CLINICAL DATA:  73 year old with nausea and vomiting. History of lymphoma. EXAM: CT ABDOMEN AND PELVIS WITH CONTRAST TECHNIQUE: Multidetector CT imaging of the abdomen and pelvis was performed using the standard protocol following bolus administration of intravenous contrast. CONTRAST:  68m ISOVUE-300 IOPAMIDOL (ISOVUE-300) INJECTION 61% COMPARISON:  PET-CT 04/28/2006 FINDINGS: Lower chest: Lung bases are clear. Hepatobiliary: Normal appearance of the liver, gallbladder and portal venous system. No biliary dilatation. Pancreas: Unremarkable. No pancreatic ductal dilatation or surrounding inflammatory changes. Spleen: Normal in size without focal abnormality. Adrenals/Urinary Tract: Normal adrenal glands. Chronic atrophy of the left kidney. Chronic dilatation of the left renal pelvis and left renal calices. Small hypodensities in right kidney are too small to definitively characterize. Negative for right hydronephrosis. Urinary bladder is unremarkable. Stomach/Bowel: Stomach and duodenum are distended. Duodenum measures up to 5.5 cm in diameter. Markedly dilated proximal jejunum measuring up to 6.5 cm on sequence 3, image 64. Small bowel loops in the central abdomen have mild wall thickening with mucosal enhancement. The distal small bowel and terminal ileum are decompressed. Multiple small bowel loops are encircling large clusters of central mesenteric calcifications. Findings are compatible with a  high-grade small bowel obstruction. The colon is decompressed. There is a retrocecal appendix without inflammation. Vascular/Lymphatic: Atherosclerotic calcifications in the aorta and iliac arteries without aneurysm. Soft tissue thickening along the left side of the aorta at the level of the atrophic kidney is probably associated with the atrophic kidney and similar to the prior examination. Increased left periaortic calcifications. Again noted are extensive central mesenteric calcifications associated with small mesenteric lymph nodes. There was a similar finding on the study from 2008 although the calcifications may have slightly progressed. Mesenteric calcifications are poorly defined but roughly measures 4.9 x 4.1 cm on sequence 3, image 43. Reproductive: Prostate enlargement measuring 6.9 cm in the short axis. Other: Negative for free fluid.  Negative for free air. Musculoskeletal: No acute bone abnormality. IMPRESSION: 1. High-grade small bowel obstruction. Massive distension of the stomach, duodenum and proximal small bowel. Transition point is associated with the large mesenteric calcifications in the mid abdomen. Distal small bowel is decompressed. Mild small bowel wall thickening centered around the mesenteric calcifications. 2. Massive central mesenteric calcifications that have minimally changed since 2008. Based on the history of lymphoma, findings are most compatible with treated disease. No evidence for lymphoma recurrence. 3. Chronic atrophy of the left kidney with dilatation of the left renal collecting system, no significant change since 2008. 4.  Aortic Atherosclerosis (ICD10-I70.0). 5. Prostate enlargement. Electronically Signed   By: AMarkus DaftM.D.   On: 06/29/2018 15:24   Dg Abd Portable 1v  Result Date: 07/03/2018 CLINICAL DATA:  Small bowel obstruction. EXAM: PORTABLE ABDOMEN - 1 VIEW COMPARISON:  July 02, 2018 KUB.  June 29, 2018 CT scan. FINDINGS: The known mesenteric calcifications  seen on previous CT imaging are stable. The side port of the NG tube is  just below the GE junction with the distal tip near the fundus. Dilated loops of small bowel remain, particularly in the left abdomen. Many of the loops are noted to be fluid-filled on the previous CT scan making it difficult to completely evaluate. However, the number of dilated loops on today's study appear less in number. No free air, portal venous gas, or pneumatosis. No other changes. IMPRESSION: 1. The side port of the NG tube is just below the GE junction with the distal tip in the fundus. 2. Continued small bowel obstruction. It is difficult to compare severity in the interval as many of the dilated loops of small bowel were noted to be fluid-filled on the recent CT scan are not and are not well assessed on a KUB. 3. No other changes. Electronically Signed   By: Dorise Bullion III M.D   On: 07/03/2018 08:52   Dg Abd Portable 1v  Result Date: 07/02/2018 CLINICAL DATA:  Small bowel obstruction. EXAM: PORTABLE ABDOMEN - 1 VIEW COMPARISON:  CT 06/29/2018.  Abdominal radiograph 07/01/2018 FINDINGS: Persistent gas-filled dilated loops of small bowel in the left abdomen. Again noted are numerous mesenteric calcifications. A nasogastric tube is not identified on this single supine image. There is gas in the rectum. Limited evaluation for free air on this supine image. IMPRESSION: Dilated gas-filled loops of small bowel in left abdomen. Findings remain compatible with a small bowel obstruction. Electronically Signed   By: Markus Daft M.D.   On: 07/02/2018 08:35   Dg Abd Portable 1v  Result Date: 07/01/2018 CLINICAL DATA:  Small-bowel obstruction. EXAM: PORTABLE ABDOMEN - 1 VIEW COMPARISON:  06/30/2018 FINDINGS: Nasogastric tube tip lies in the proximal stomach. The stomach appears decompressed. Gas pattern is nonspecific. Mid abdominal calcifications are stable. IMPRESSION: Nasogastric tube tip lies in the proximal stomach. Bowel gas  pattern is nonobstructive. Electronically Signed   By: Staci Righter M.D.   On: 07/01/2018 08:05   Dg Abd Portable 1v-small Bowel Obstruction Protocol-initial, 8 Hr Delay  Result Date: 06/30/2018 CLINICAL DATA:  Small-bowel obstruction EXAM: PORTABLE ABDOMEN - 1 VIEW COMPARISON:  06/29/2018 FINDINGS: Unchanged position of nasogastric tube. There is hyperdense material in the gastric fundus, likely contrast material. Mid abdominal calcifications are unchanged. There is excreted contrast material in the urinary bladder. There is gaseous distention of the stomach. No dilated bowel is identified. IMPRESSION: Nasogastric tube tip and side port project over the stomach. No dilated small bowel identified. There is gaseous distention of the stomach. Electronically Signed   By: Ulyses Jarred M.D.   On: 06/30/2018 04:33   Dg Abd Portable 1v-small Bowel Protocol-position Verification  Result Date: 06/29/2018 CLINICAL DATA:  NG tube placement EXAM: PORTABLE ABDOMEN - 1 VIEW COMPARISON:  CT 06/29/2018 FINDINGS: Lung bases are clear. Esophageal tube tip in the left upper quadrant. Dilated small bowel in the left upper quadrant consistent with history of bowel obstruction. IMPRESSION: Esophageal tube tip projects over the gastric body region. Electronically Signed   By: Donavan Foil M.D.   On: 06/29/2018 18:13   [place] Ekg Site Rite  Result Date: 07/03/2018 If Site Rite image not attached, placement could not be confirmed due to current cardiac rhythm.   ASSESSMENT & PLAN:   73 y.o. male with  1. History Stage IV Diffuse Large B-cell Lymphoma 09/04/1998 Retroperitoneum biopsy revealed Diffuse Large B-Cell Lymphoma S/p 4 cycles of CHOP followed by 2 cycles of R-CHOP, then R-ICE, and 25 fractions of [facility]RT [facility]  2.  Newly diagnosed Diffuse Large B-Cell Lymphoma S/p 07/03/18 exploratory laparotomy, lysis of adhesion, small bowel resection with resection of calcified mesenteric mass  PLAN: -Discussed  patient's most recent labs from 07/12/18 and 07/16/18, anemic with HGB improving, albumin low, WBC and PLT normal -Discussed the 07/03/18 Biopsy which revealed Diffuse Large B-Cell Lymphoma -Discussed the 06/29/18 CT A/P which revealed "High-grade small bowel obstruction. Massive distension of the stomach, duodenum and proximal small bowel. Transition point is associated with the large mesenteric calcifications in the mid abdomen. Distal small bowel is decompressed. Mild small bowel wall thickening centered around the mesenteric calcifications. 2. Massive central mesenteric calcifications that have minimally changed since 2008. Based on the history of lymphoma, findings are most compatible with treated disease. No evidence for lymphoma recurrence. 3. Chronic atrophy of the left kidney with dilatation of the left renal collecting system, no significant change since 2008. 4.  Aortic Atherosclerosis 5. Prostate enlargement." -Optimize nutrition and functional status prior to initiating treatment to ensure tolerance of impending chemotherapy. CT imaging reassuring. No focal chest symptomatology. -Will set pt up for outpatient PT and nutritional therapist consult after he leaves SNF -Pt has picc line and pt reports that this has been flushed regularly at SNF -Will order blood tests today -Will order PET/CT to be completed in 2 weeks to allow post surgical inflammation to further improve. Pt would greatly like to view his PET/CT independently. -Will order ECHO -Will see the pt back in 2 weeks  3.  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 The patient knows to call the clinic with any problems, questions or concerns.  The total time spent in the appt was 60 minutes and more than 50% was on counseling and direct patient cares.    Sullivan Lone MD MS AAHIVMS [facility] Hematology/Oncology Physician [facility]  (Office):       [phone] (Work cell):  [phone] (Fax):           [phone]  07/18/2018 12:51 PM  I, Baldwin Jamaica, am acting as a scribe for Dr. Sullivan Lone.   .I have reviewed the above documentation for accuracy and completeness, and I agree with the above. Brunetta Genera MD

## 2018-07-18 NOTE — Assessment & Plan Note (Addendum)
Suture removal scheduled 07/19/2018. Dr Marlou Starks to provide disposition as to JP drain & PICC line. Patient to be discharged home 5/15 with [facility] @ his request. Dr Irene Limbo plans no chemotherapy for 4-5 weeks post op & after PET scan performed.

## 2018-07-18 NOTE — Patient Instructions (Addendum)
See assessment and plan under each diagnosis in the problem list and acutely for this visit Total time 52  minutes; greater than 50% of the visit spent counseling patient and coordinating care for problems addressed at this encounter  

## 2018-07-18 NOTE — Progress Notes (Signed)
NURSING HOME LOCATION:  [facility] ROOM NUMBER:  [id number]  CODE STATUS:  Full Code  PCP:  [name], PA-C  [place] [id number]  This is a comprehensive admission note to [facility] performed on this date less than 30 days from date of admission.  He was admitted on 5/8; I was notified of the admission on 07/18/2018.  Included are preadmission medical/surgical history; reconciled medication list; family history; social history and comprehensive review of systems.   Corrections and additions to the records were documented. Comprehensive physical exam was also performed. Additionally a clinical summary was entered for each active diagnosis pertinent to this admission in the Problem List to enhance continuity of care.  HPI: Patient was hospitalized 4/24-07/13/2018 with small bowel obstruction.  He presented with a 12-day history of acute/subacute intractable nausea and bilious emesis.  He describes similar, milder symptoms 3-4 times a year.  He underwent exploratory laparotomy; adhesion lysis; small bowel resection; and calcified mesenteric mass resection 4/28 by Dr Autumn Messing.  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  This is in the context of a past medical history of non-Hodgkin's lymphoma in 2000 for which he received chemotherapy and radiation.  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 His acute illness was associated with AKI; creatinine was 1.7 at presentation.  With fluid resuscitation creatinine was 0.8 pre discharge. Because of persistent hypotension, his ARB was held. Postoperatively his hemoglobin dropped from 14.5 to 10.  It slowly decreased to a value of 8.4 at the time of discharge. B12 level was 285 , ferritin 76 and iron level 11.  The patient declined IV iron transfusions or IM B12.  Follow-up CBC and differential here at the SNF reveals a hemoglobin 9.7/hematocrit  29.3 with normochromic, normocytic indices.  White count was normal at 10,800.  The patient is on Plavix. Dr. Burr Medico, Oncology, saw the patient in consultation; PICC line was to be left in for future chemotherapy.  Follow-up appointment with oncology was  today 5/13 at 11 AM with Dr. Irene Limbo. Dr Irene Limbo wants to wait 4-5 weeks postop and perform a PET scan prior to proceeding with chemotherapy. Staples were to be removed 5/14 @ Dr Ethlyn Gallery office. JP drain was left in place with monitoring of output. Discharge summary states that it was his request to come to the SNF as he lives alone w/o health care assistance.  Past medical and surgical history: Relates to the previous history of non-Hodgkin's lymphoma.  He has had nasal fracture surgery as well as eye surgery; apparently he had a foreign body removed from the eye.  He states that it was this positive surgical experience at [facility] that prompted him to come back here for his acute abdominal issues.  Social history: He lives in [place], [place], but came here originally because his stepbrother had been an Anesthesiologist at this facility.  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  He does not smoke or drink.  Family history: Reviewed; sister had multiple myeloma.   Review of systems: He states that since he became ill he has lost 24 pounds.  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  He states he is not having significant abdominal pain and has been taking Tylenol.  He has had frequent loose-watery stools until today.  He has had no bowel movements today.  He states that the drainage from the JP tube has decreased significantly.  He is short of breath due to deconditioning.  He has no other cardiopulmonary symptoms.  He did have significant hypotension following PT/OT.  He describes anxiety and depression with the acute illness.  Constitutional: No fever Eyes: No redness,  discharge, pain, vision change ENT/mouth: No nasal congestion, purulent discharge, earache, change in hearing, sore throat  Cardiovascular: No chest pain, palpitations, paroxysmal nocturnal dyspnea, claudication, edema  Respiratory: No cough, sputum production, hemoptysis, significant snoring, apnea Gastrointestinal: No heartburn, dysphagia, nausea /vomiting, rectal bleeding, melena Genitourinary: No dysuria, hematuria, pyuria, incontinence, nocturia Musculoskeletal: No joint stiffness, joint swelling, weakness, pain Dermatologic: No rash, pruritus, change in appearance of skin Neurologic: No dizziness, headache, syncope, seizures Endocrine: No change in hair/skin/nails, excessive thirst, excessive hunger, excessive urination  Hematologic/lymphatic: No significant bruising, lymphadenopathy, abnormal bleeding Allergy/immunology: No itchy/watery eyes, significant sneezing, urticaria, angioedema  Physical exam:  Pertinent or positive findings: He is thin but appears adequately nourished.  Ptosis is present on L.Heart sounds are slightly distant. Bowel sounds are very active.  There is no evidence of cellulitis or purulence around the metal sutures.  Drain is present in the left lateral abdomen.PICC line R thoracic area.  General appearance:  no acute distress, increased work of breathing is present.   Lymphatic: No lymphadenopathy about the head, neck, axilla. Eyes: No conjunctival inflammation or lid edema is present. There is no scleral icterus. Ears:  External ear exam shows no significant lesions or deformities.   Nose:  External nasal examination shows no deformity or inflammation. Nasal mucosa are pink and moist without lesions, exudates Oral exam: Lips and gums are healthy appearing.There is no oropharyngeal erythema or exudate. Neck:  No thyromegaly, masses, tenderness noted.    Heart:  Normal rate and regular rhythm. S1 and S2 normal without gallop, murmur, click, rub.  Lungs: Chest  clear to auscultation without wheezes, rhonchi, rales, rubs. Abdomen:  Abdomen is soft and nontender with no organomegaly, hernias, masses. GU: Deferred  Extremities:  No cyanosis, clubbing, edema. Neurologic exam:  Strength equal  in upper & lower extremities. Skin: Warm & dry w/o tenting. No significant lesions or rash.  See clinical summary under each active problem in the Problem List with associated updated therapeutic plan'

## 2018-07-18 NOTE — Telephone Encounter (Signed)
Scheduled appt per 5/13 los.  Number was longer in service.  I called the other number under the contact also, with no luck.

## 2018-07-18 NOTE — Assessment & Plan Note (Addendum)
CBC at SNF revealed hemoglobin 9.7 and hematocrit 29.3 with normal chromic, normocytic indices.  I discussed pathophysiology of B12 deficiency & it's relationship to neurologic issues. He has pre-existing peripheral neuropathy from Vincristine chemo for NHL. OTC oral MVI with iron & OTC B12 recommended until Oncologic follow up.Marland Kitchen

## 2018-07-19 ENCOUNTER — Encounter: Payer: Self-pay | Admitting: Internal Medicine

## 2018-07-19 ENCOUNTER — Non-Acute Institutional Stay (SKILLED_NURSING_FACILITY): Payer: Medicare Other | Admitting: Internal Medicine

## 2018-07-19 DIAGNOSIS — D649 Anemia, unspecified: Secondary | ICD-10-CM

## 2018-07-19 DIAGNOSIS — K56609 Unspecified intestinal obstruction, unspecified as to partial versus complete obstruction: Secondary | ICD-10-CM | POA: Diagnosis not present

## 2018-07-19 DIAGNOSIS — T451X5A Adverse effect of antineoplastic and immunosuppressive drugs, initial encounter: Secondary | ICD-10-CM | POA: Insufficient documentation

## 2018-07-19 DIAGNOSIS — G620 Drug-induced polyneuropathy: Secondary | ICD-10-CM

## 2018-07-19 DIAGNOSIS — K668 Other specified disorders of peritoneum: Secondary | ICD-10-CM | POA: Diagnosis not present

## 2018-07-19 DIAGNOSIS — N179 Acute kidney failure, unspecified: Secondary | ICD-10-CM

## 2018-07-19 LAB — HEPATITIS B CORE ANTIBODY, TOTAL: Hep B Core Total Ab: NEGATIVE

## 2018-07-19 LAB — HEPATITIS B SURFACE ANTIGEN: Hepatitis B Surface Ag: NEGATIVE

## 2018-07-19 LAB — HEPATITIS C ANTIBODY: HCV Ab: <0.1 s/co ratio (ref 0.0–0.9)

## 2018-07-19 NOTE — Assessment & Plan Note (Signed)
B12 level 285; oral supplementation recommended until Oncologic follow up

## 2018-07-19 NOTE — Progress Notes (Signed)
Location:    [facility] Room Number: 306/A Place of Service:  SNF [phone])  Provider: Granville Lewis PA-C  PCP: [name], PA-C Patient Care Team: Mickel Duhamel as PCP - General (Physician Assistant)  Extended Emergency Contact Information Primary Emergency Contact: Eakes,Marshall Mobile Phone: [phone] Relation: None  Code Status: Full Code Goals of care:  Advanced Directive information Advanced Directives 5/Jack/2020  Does Patient Have a Medical Advance Directive? Yes  Type of Advance Directive (No Data)  Does patient want to make changes to medical advance directive? No - Patient declined  Would patient like information on creating a medical advance directive? No - Patient declined     Allergies  Allergen Reactions  . Levofloxacin   . Midazolam     Chief Complaint  Patient presents with  . Discharge Note    Discharge Visit    HPI:  73 y.o. Fuentes seen today for discharge from facility- Patient came to facility about a week ago for short-term rehab after hospitalization for small bowel obstruction.  Apparently had approximately a 2-week history of acute subacute intractable nausea and emesis-.  He underwent an exploratory laparotomy with adhesion lysis and small bowel resection with a calcified mesenteric mass resection.  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  He does have a previous history of non-Hodgkin's lymphoma for which he received chemotherapy and radiation.  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  He also had acute kidney injury with creatinine going up to 1.7 fluid resuscitation this came down to 0.8 on lab done yesterday it was 0.91.  Because of persistent hypotension his arm was held in the hospital and currently is being held.  His hemoglobin also dropped down to 8.4 it had been as high as Jack.5 at one time.   He declined IV iron infusions or IM B12 in the hospital.  However follow-up CBC has shown improvement with hemoglobin of 10.7 on lab done yesterday.  This apparently was done at the cancer center. Did see Dr. Irene Limbo of oncology yesterday.  Apparently oncology would like to wait approximately a month.  Before performing a PET scan prior to proceeding with chemotherapy.  He also had his staples removed earlier today at the surgical center.  Patient was here for short-term rehab since he lives alone without healthcare assistance.  Patient will be going home apparently he will be with a friend he will need continued PT and OT as well as nursing support and wound care.  He has no acute complaints today still has some soreness around his abdominal area but this is not acute the way he describes it.  He continues on Neurontin for his history of neuropathy he does have Tylenol for pain and continues on Plavix for what appears to be an history of arthrosclerosis     Past Medical History:  Diagnosis Date  . Cancer ([place])   . History of left bundle branch block (LBBB)   . SBO (small bowel obstruction) ([place]) 06/2018    Past Surgical History:  Procedure Laterality Date  . EYE SURGERY    . LAPAROTOMY N/A 07/03/2018   Procedure: EXPLORATORY LAPAROTOMY, LYSIS OF ADHESION, BOWEL RESECTION, RESECTION MESTASTATIC TUMOR;  Surgeon: Jovita Kussmaul, MD;  Location: [facility];  Service: General;  Laterality: N/A;  . NASAL FRACTURE SURGERY        reports that he has never smoked. He  has never used smokeless tobacco. He reports previous alcohol use. He reports previous drug use. Social History   Socioeconomic History  . Marital status: Divorced    Spouse name: Not on file  . Number of children: Not on file  . Years of education: Not on file  . Highest education level: Not on file  Occupational History  . Not on file  Social Needs  . Financial resource strain: Not on file  . Food insecurity:     Worry: Not on file    Inability: Not on file  . Transportation needs:    Medical: Not on file    Non-medical: Not on file  Tobacco Use  . Smoking status: Never Smoker  . Smokeless tobacco: Never Used  Substance and Sexual Activity  . Alcohol use: Not Currently  . Drug use: Not Currently  . Sexual activity: Not on file  Lifestyle  . Physical activity:    Days per week: Not on file    Minutes per session: Not on file  . Stress: Not on file  Relationships  . Social connections:    Talks on phone: Not on file    Gets together: Not on file    Attends religious service: Not on file    Active member of club or organization: Not on file    Attends meetings of clubs or organizations: Not on file    Relationship status: Not on file  . Intimate partner violence:    Fear of current or ex partner: Not on file    Emotionally abused: Not on file    Physically abused: Not on file    Forced sexual activity: Not on file  Other Topics Concern  . Not on file  Social History Narrative  . Not on file   Functional Status Survey:    Allergies  Allergen Reactions  . Levofloxacin   . Midazolam     Pertinent  Health Maintenance Due  Topic Date Due  . COLONOSCOPY  06/Jack/2020 (Originally 07/15/1997)  . PNA vac Low Risk Adult (1 of 2 - PCV13) 06/Jack/2020 (Originally 07/15/2012)  . INFLUENZA VACCINE  10/06/2018    Medications: Allergies as of 5/Jack/2020      Reactions   Levofloxacin    Midazolam       Medication List       Accurate as of May Jack, 2020 10:56 AM. If you have any questions, ask your nurse or doctor.        STOP taking these medications   guaiFENesin 100 MG/5ML liquid Commonly known as:  ROBITUSSIN Stopped by:  Granville Lewis, PA-C     TAKE these medications   acetaminophen 325 MG tablet Commonly known as:  TYLENOL Take 2 tablets (650 mg total) by mouth every 6 (six) hours.   clopidogrel 75 MG tablet Commonly known as:  PLAVIX Take 75 mg by mouth daily.   Daily  Probiotic Supplement 250 MG capsule Generic drug:  saccharomyces boulardii Take 250 mg by mouth 2 (two) times daily. FOR SUPPLEMENT(DO NOT CRUSH)   Ensure Take 237 mLs by mouth 3 (three) times daily between meals.   gabapentin 600 MG tablet Commonly known as:  NEURONTIN Take 600 mg by mouth 2 (two) times daily.   methocarbamol 500 MG tablet Commonly known as:  ROBAXIN Take 1 tablet (500 mg total) by mouth every 8 (eight) hours as needed for muscle spasms.   multivitamin with minerals Tabs tablet Take 1 tablet by mouth daily.   psyllium  95 % Pack Commonly known as:  HYDROCIL/METAMUCIL Take 1 packet by mouth daily.   rosuvastatin 10 MG tablet Commonly known as:  CRESTOR Take 10 mg by mouth daily.   Turmeric 500 MG Caps Take 500 mg by mouth 2 (two) times daily.      Of note he also continues on iron  Review of Systems   In general is not complaining of any fever or chills.  Skin does not complain of rashes or itching surgical site midabdomen staples have been removed.  Head ears eyes nose mouth and throat is not complaining of visual changes or sore throat.  Respiratory does not complain of shortness of breath at rest he does complain of some shortness of breath with exertion with therapy but this is not new.  He is not complaining of cough.  Cardiac is not complaining of chest pain or edema.  GI has an extensive history here but does not complain of acute abdominal tenderness nausea or vomiting.  GU is not complaining of dysuria does have a history of prostate enlargement.  Musculoskeletal has some weakness but has gained some strength not currently complain of joint pain.  Neurologic does not complain of dizziness headache or syncope does have weakness.  And psych currently not complaining of being depressed or anxious he has dealt with this somewhat however since his diagnosis  Temperature is 97.8 pulse 72 respirations 20 blood pressure 138/64 Physical Exam  In general this is a very pleasant elderly Fuentes who looks younger than his stated age.  His skin is warm and dry there are Steri-Strips over abdominal wound I do not see any sign of infection with drainage or bleeding or increased erythema.  Eyes visual acuity appears to be intact sclera and conjunctive are clear.  Oropharynx is clear mucous membranes moist.  Chest is clear to auscultation is no labored breathing.  Heart he does have distant heart sounds from what I could auscultate  And  feel per radial pulses regular rate and rhythm He does not have significant edema.  Abdomen is soft some tenderness to palpation but nothing more than what you would expect typical postop surgery- abdominal wound as noted above Steri-Strips are in place bowel sounds are active he does have a drain placed left abdomen with a small amount of drainage.  Musculoskeletal he is able to move all extremities x4 he is actually able to ambulate this.  Neurologic as noted above his speech is clear no lateralizing findings.  Psych he is alert and oriented very pleasant and appropriate.  Labs.  Jul 18, 2018.  Sodium 136 potassium 3.3 BUN 10 creatinine 0.91.  Albumin 2.5 bilirubin 0.2.  WBC 9.5 hemoglobin 10.7 platelets 417.  Assessment and plan.  1.  History of small bowel obstruction with history of mesenteric mass with resection with previous history of B-cell lymphoma-she is followed by oncology and surgery he did see Dr. Irene Limbo yesterday and apparently plans are for a PET scan in approximately a month before starting chemotherapy.  He will need expedient follow-up.   he has had sutures removed and surgery will provide disposition as to JP drain and PICC line  In regards to surgery he did have his staples removed today- and will have follow-up with them as needed.  2.--History of peripheral neuropathy due to chemotherapy- B12 level recently was 285 oral supplementation has been recommended and he says  he will try to get this over-the-counter.  3.  History of anemia-he is taking iron  hemoglobin appears to be rising most recently 10.7 on lab done yesterday.  Again he will have oncology follow-up  #4 history of peripheral neuropathy he does continue on Neurontin again he is going to try to supplement his B12.  5 history of acute kidney injury this appears to have stabilized with hydration creatinine is relatively baseline at 0.9 on lab done yesterday.  6.  History of hypertension this appears relatively stable despite being off his ARM  will need follow-up by primary care provider.  #7 mild hypokalemia-I did note a metabolic panel done yesterday his potassium was slightly low at 3.3 will supplement this with 20 mEq of potassium x2 doses he will need follow-up expediently with  primary care provider  Again he will be going home will have PT and OT as well as nursing support for wound care.  He will need follow-up by oncology as well as surgery in addition to his primary care provider.  At this point clinically appears stable apparently there was some thought of him going  home from the hospital but with his weakness it was thought he would be best be served by going skilled nursing for short period which he  has completed   [id number] note greater than 30 minutes spent assessing patient-reviewing his chart and labs- and coordinating and formulating a plan of care- of note greater than 50% of time spent coordinating a plan of care with input as noted above  ADDITIONAL LABS  Basic Metabolic Panel: Recent Labs    07/06/18 0442  07/09/18 0451 07/10/18 0430 07/12/18 0415 07/18/18 1302  NA 139   < > 136 136 135 136  K 4.0   < > 4.1 4.0 3.8 3.3*  CL 103   < > 104 104 109 109  CO2 28   < > 25 23 19* 19*  GLUCOSE 105*   < > 121* 103* 91 89  BUN 18   < > 20 15 10 10   CREATININE 0.92   < > 0.89 0.88 0.81 0.91  CALCIUM 8.2*   < > 7.8* 8.0* 7.9* 8.4*  MG 2.2  --  1.9  --  1.8  --   PHOS  3.7  --  2.8  --  3.1  --    < > = values in this interval not displayed.   Liver Function Tests: Recent Labs    07/09/18 0451 07/12/18 0415 07/18/18 1302  AST 54* 38 24  ALT 53* 53* 35  ALKPHOS 84 76 108  BILITOT 0.5 0.4 0.2*  PROT 4.1* 4.5* 6.1*  ALBUMIN 1.6* 1.7* 2.5*   Recent Labs    06/29/18 1221  LIPASE 54*   No results for input(s): AMMONIA in the last 8760 hours. CBC: Recent Labs    06/29/18 1221  07/04/18 0420  07/10/18 0430 07/12/18 0415 07/16/18 07/18/18 1302  WBC 12.0*   < > 15.1*   < > 7.7 8.1 10.8 9.5  NEUTROABS 9.5*  --  13.9*  --   --   --   --  7.0  HGB 15.5   < > 12.5*   < > 8.3* 8.4* 9.7* 10.2*  HCT 45.6   < > 39.2   < > 26.2* 26.9* 29* 32.1*  MCV 84.1   < > 90.3   < > 90.0 89.1  --  87.7  PLT 231   < > 172   < > 212 277 376 417*   < > = values in this interval  not displayed.   Cardiac Enzymes: No results for input(s): CKTOTAL, CKMB, CKMBINDEX, TROPONINI in the last 8760 hours. BNP: Invalid input(s): POCBNP CBG: Recent Labs    07/11/18 1958 07/11/18 2338 07/12/18 0349  GLUCAP 92 86 78    Procedures and Imaging Studies During Stay: Ct Abdomen Pelvis W Contrast  Result Date: 06/29/2018 CLINICAL DATA:  73 year old with nausea and vomiting. History of lymphoma. EXAM: CT ABDOMEN AND PELVIS WITH CONTRAST TECHNIQUE: Multidetector CT imaging of the abdomen and pelvis was performed using the standard protocol following bolus administration of intravenous contrast. CONTRAST:  28mL ISOVUE-300 IOPAMIDOL (ISOVUE-300) INJECTION 61% COMPARISON:  PET-CT 04/28/2006 FINDINGS: Lower chest: Lung bases are clear. Hepatobiliary: Normal appearance of the liver, gallbladder and portal venous system. No biliary dilatation. Pancreas: Unremarkable. No pancreatic ductal dilatation or surrounding inflammatory changes. Spleen: Normal in size without focal abnormality. Adrenals/Urinary Tract: Normal adrenal glands. Chronic atrophy of the left kidney. Chronic dilatation of  the left renal pelvis and left renal calices. Small hypodensities in right kidney are too small to definitively characterize. Negative for right hydronephrosis. Urinary bladder is unremarkable. Stomach/Bowel: Stomach and duodenum are distended. Duodenum measures up to 5.5 cm in diameter. Markedly dilated proximal jejunum measuring up to 6.5 cm on sequence 3, image 64. Small bowel loops in the central abdomen have mild wall thickening with mucosal enhancement. The distal small bowel and terminal ileum are decompressed. Multiple small bowel loops are encircling large clusters of central mesenteric calcifications. Findings are compatible with a high-grade small bowel obstruction. The colon is decompressed. There is a retrocecal appendix without inflammation. Vascular/Lymphatic: Atherosclerotic calcifications in the aorta and iliac arteries without aneurysm. Soft tissue thickening along the left side of the aorta at the level of the atrophic kidney is probably associated with the atrophic kidney and similar to the prior examination. Increased left periaortic calcifications. Again noted are extensive central mesenteric calcifications associated with small mesenteric lymph nodes. There was a similar finding on the study from 2008 although the calcifications may have slightly progressed. Mesenteric calcifications are poorly defined but roughly measures 4.9 x 4.1 cm on sequence 3, image 43. Reproductive: Prostate enlargement measuring 6.9 cm in the short axis. Other: Negative for free fluid.  Negative for free air. Musculoskeletal: No acute bone abnormality. IMPRESSION: 1. High-grade small bowel obstruction. Massive distension of the stomach, duodenum and proximal small bowel. Transition point is associated with the large mesenteric calcifications in the mid abdomen. Distal small bowel is decompressed. Mild small bowel wall thickening centered around the mesenteric calcifications. 2. Massive central mesenteric  calcifications that have minimally changed since 2008. Based on the history of lymphoma, findings are most compatible with treated disease. No evidence for lymphoma recurrence. 3. Chronic atrophy of the left kidney with dilatation of the left renal collecting system, no significant change since 2008. 4.  Aortic Atherosclerosis (ICD10-I70.0). 5. Prostate enlargement. Electronically Signed   By: Markus Daft M.D.   On: 06/29/2018 15:24   Dg Abd Portable 1v  Result Date: 07/03/2018 CLINICAL DATA:  Small bowel obstruction. EXAM: PORTABLE ABDOMEN - 1 VIEW COMPARISON:  July 02, 2018 KUB.  June 29, 2018 CT scan. FINDINGS: The known mesenteric calcifications seen on previous CT imaging are stable. The side port of the NG tube is just below the GE junction with the distal tip near the fundus. Dilated loops of small bowel remain, particularly in the left abdomen. Many of the loops are noted to be fluid-filled on the previous CT scan making it difficult to  completely evaluate. However, the number of dilated loops on today's study appear less in number. No free air, portal venous gas, or pneumatosis. No other changes. IMPRESSION: 1. The side port of the NG tube is just below the GE junction with the distal tip in the fundus. 2. Continued small bowel obstruction. It is difficult to compare severity in the interval as many of the dilated loops of small bowel were noted to be fluid-filled on the recent CT scan are not and are not well assessed on a KUB. 3. No other changes. Electronically Signed   By: Dorise Bullion III M.D   On: 07/03/2018 08:52   Dg Abd Portable 1v  Result Date: 07/02/2018 CLINICAL DATA:  Small bowel obstruction. EXAM: PORTABLE ABDOMEN - 1 VIEW COMPARISON:  CT 06/29/2018.  Abdominal radiograph 07/01/2018 FINDINGS: Persistent gas-filled dilated loops of small bowel in the left abdomen. Again noted are numerous mesenteric calcifications. A nasogastric tube is not identified on this single supine image.  There is gas in the rectum. Limited evaluation for free air on this supine image. IMPRESSION: Dilated gas-filled loops of small bowel in left abdomen. Findings remain compatible with a small bowel obstruction. Electronically Signed   By: Markus Daft M.D.   On: 07/02/2018 08:35   Dg Abd Portable 1v  Result Date: 07/01/2018 CLINICAL DATA:  Small-bowel obstruction. EXAM: PORTABLE ABDOMEN - 1 VIEW COMPARISON:  06/30/2018 FINDINGS: Nasogastric tube tip lies in the proximal stomach. The stomach appears decompressed. Gas pattern is nonspecific. Mid abdominal calcifications are stable. IMPRESSION: Nasogastric tube tip lies in the proximal stomach. Bowel gas pattern is nonobstructive. Electronically Signed   By: Staci Righter M.D.   On: 07/01/2018 08:05   Dg Abd Portable 1v-small Bowel Obstruction Protocol-initial, 8 Hr Delay  Result Date: 06/30/2018 CLINICAL DATA:  Small-bowel obstruction EXAM: PORTABLE ABDOMEN - 1 VIEW COMPARISON:  06/29/2018 FINDINGS: Unchanged position of nasogastric tube. There is hyperdense material in the gastric fundus, likely contrast material. Mid abdominal calcifications are unchanged. There is excreted contrast material in the urinary bladder. There is gaseous distention of the stomach. No dilated bowel is identified. IMPRESSION: Nasogastric tube tip and side port project over the stomach. No dilated small bowel identified. There is gaseous distention of the stomach. Electronically Signed   By: Ulyses Jarred M.D.   On: 06/30/2018 04:33   Dg Abd Portable 1v-small Bowel Protocol-position Verification  Result Date: 06/29/2018 CLINICAL DATA:  NG tube placement EXAM: PORTABLE ABDOMEN - 1 VIEW COMPARISON:  CT 06/29/2018 FINDINGS: Lung bases are clear. Esophageal tube tip in the left upper quadrant. Dilated small bowel in the left upper quadrant consistent with history of bowel obstruction. IMPRESSION: Esophageal tube tip projects over the gastric body region. Electronically Signed   By: Donavan Foil M.D.   On: 06/29/2018 18:13   [place] Ekg Site Rite  Result Date: 07/03/2018 If Site Rite image not attached, placement could not be confirmed due to current cardiac rhythm.   :

## 2018-07-20 ENCOUNTER — Other Ambulatory Visit: Payer: Self-pay | Admitting: Adult Health

## 2018-07-20 ENCOUNTER — Telehealth: Payer: Self-pay | Admitting: *Deleted

## 2018-07-20 MED ORDER — SODIUM CHLORIDE FLUSH 0.9 % IV SOLN: 10.0000 mL | Freq: Three times a day (TID) | INTRAVENOUS | 0 refills | Status: AC

## 2018-07-20 NOTE — Telephone Encounter (Signed)
Patient requests having PET/CT at a location that can supply him with a disc he can utilize to view his scan.  [facility] states they can supply him a disc, but that without correct software, patient's are unable to open/read.  Patient asked to have PET/CT completed at [facility] as he has been there in the past and been given a disc with viewer attached, so would like PET/CT scheduled there. [facility], [address], [place] contacted by office and patient. No answer or voice mail option. Patient called, asked that PET/Ct be scheduled 'wherever Dr. Irene Limbo usually has them done'. Advised him order has been placed for PET/CT to be completed at [facility], central scheduling will contact him pending authorization from insurance. Gave him [employer] number if he wants to contact them. Patient asked if [facility] can be contacted to find out what kind of viewer software is used so that he can install it on his computer and be able to read scan once disc is supplied. Advised patient that this RN will ask radiology and contact him if information is available. Patient verbalized understanding of all information.

## 2018-07-23 ENCOUNTER — Other Ambulatory Visit: Payer: Self-pay | Admitting: Hematology

## 2018-07-24 ENCOUNTER — Telehealth: Payer: Self-pay | Admitting: *Deleted

## 2018-07-24 ENCOUNTER — Telehealth (HOSPITAL_COMMUNITY): Payer: Self-pay

## 2018-07-24 NOTE — Telephone Encounter (Signed)
LVM at [phone] (patient left this as contact number): Gave number for central scheduling so that patient may contact to schedule PET scan. In response to earlier patient query: Per [facility], unable to 'burn' copies of PET scan, however, may be able to obtain disc from PET directly. Per [facility] Radiology staff, software used to read PET is [employer].

## 2018-07-25 ENCOUNTER — Other Ambulatory Visit (HOSPITAL_COMMUNITY): Payer: Medicare Other

## 2018-07-26 ENCOUNTER — Telehealth: Payer: Self-pay | Admitting: *Deleted

## 2018-07-26 ENCOUNTER — Ambulatory Visit: Payer: Self-pay | Admitting: General Surgery

## 2018-07-26 NOTE — Telephone Encounter (Signed)
Patient left voice mail: states that [facility] is not a reader for radiologic imaging, he needs more specific information including the name of the Radiologist group used by [facility]. Also asked how long he should wait to schedule his PET/CT, stating Dr. Irene Limbo said he should wait until the inflammation from the surgery was decreased. Asked if he could have the cardiac stress or ultrasound test at this time since it wouldn't matter about inflammation.  Dr. Irene Limbo states that patient can schedule PET/CT at this time, that inflammation from surgery should have subsided and that the cardiac test is an ECHO cardiogram that the patient can schedule whenever convenient.  Contacted patient with information as above from Dr. Irene Limbo , gave him Central Scheduling # [phone] to set up times for both tests. Also gave patient # for PET [phone] so that he could contact hem for more information regarding readers for imaging.  Patient verbalized understanding.

## 2018-07-27 ENCOUNTER — Other Ambulatory Visit (HOSPITAL_COMMUNITY): Payer: Self-pay | Admitting: General Surgery

## 2018-07-27 DIAGNOSIS — Z452 Encounter for adjustment and management of vascular access device: Secondary | ICD-10-CM

## 2018-07-31 ENCOUNTER — Other Ambulatory Visit: Payer: Self-pay

## 2018-07-31 ENCOUNTER — Ambulatory Visit (HOSPITAL_COMMUNITY)
Admission: RE | Admit: 2018-07-31 | Discharge: 2018-07-31 | Disposition: A | Discharge status: Home / Self Care | Payer: Medicare Other | Source: Ambulatory Visit | Attending: Hematology | Admitting: Hematology

## 2018-07-31 DIAGNOSIS — C8333 Diffuse large B-cell lymphoma, intra-abdominal lymph nodes: Secondary | ICD-10-CM | POA: Insufficient documentation

## 2018-07-31 DIAGNOSIS — E785 Hyperlipidemia, unspecified: Secondary | ICD-10-CM | POA: Diagnosis not present

## 2018-07-31 DIAGNOSIS — R0602 Shortness of breath: Secondary | ICD-10-CM | POA: Diagnosis not present

## 2018-07-31 DIAGNOSIS — C8590 Non-Hodgkin lymphoma, unspecified, unspecified site: Secondary | ICD-10-CM | POA: Insufficient documentation

## 2018-07-31 DIAGNOSIS — I10 Essential (primary) hypertension: Secondary | ICD-10-CM | POA: Diagnosis not present

## 2018-07-31 DIAGNOSIS — I351 Nonrheumatic aortic (valve) insufficiency: Secondary | ICD-10-CM | POA: Diagnosis not present

## 2018-07-31 NOTE — Progress Notes (Signed)
Echocardiogram 2D Echocardiogram has been performed.  Jack Fuentes 07/31/2018, 11:43 AM

## 2018-08-01 ENCOUNTER — Ambulatory Visit (HOSPITAL_COMMUNITY)
Admission: RE | Admit: 2018-08-01 | Discharge: 2018-08-01 | Disposition: A | Discharge status: Home / Self Care | Payer: Medicare Other | Source: Ambulatory Visit | Attending: General Surgery | Admitting: General Surgery

## 2018-08-01 ENCOUNTER — Encounter: Payer: Self-pay | Admitting: Radiology

## 2018-08-01 ENCOUNTER — Other Ambulatory Visit (HOSPITAL_COMMUNITY): Payer: Self-pay | Admitting: General Surgery

## 2018-08-01 ENCOUNTER — Other Ambulatory Visit: Payer: Self-pay

## 2018-08-01 ENCOUNTER — Inpatient Hospital Stay: Payer: Medicare Other | Admitting: Hematology

## 2018-08-01 DIAGNOSIS — Z452 Encounter for adjustment and management of vascular access device: Secondary | ICD-10-CM

## 2018-08-01 HISTORY — PX: IR PATIENT EVAL TECH 0-60 MINS: IMG5564

## 2018-08-01 NOTE — Procedures (Signed)
Patient came in today for a removal of a tunneled PICC line.  The patient did not have a tunneled PICC. He had a PICC line placed my IV team at [facility].  I verified with Dr Marlou Starks that he did not need the PICC for the patient's scheduled upcoming surgery.  Dr Marlou Starks requested we go ahead and pull the PICC.  PICC line was successfully removed. Length verified at 41 cm.  Documented in LDAs.  Patient had no concerns or questions.

## 2018-08-01 NOTE — Progress Notes (Signed)
Patient came in today for a removal of a tunneled PICC line.  The patient did not have a tunneled PICC. He had a PICC line placed my IV team at [facility].  I verified with Dr Marlou Starks that he did not need the PICC for the patient's scheduled upcoming surgery.  Dr Marlou Starks requested we go ahead and pull the PICC.  PICC line was successfully removed. Length verified at 41 cm.  Documented in LDAs.  Patient had no concerns or questions.

## 2018-08-02 ENCOUNTER — Telehealth: Payer: Self-pay | Admitting: Hematology

## 2018-08-02 NOTE — Telephone Encounter (Signed)
No los per 5/27.

## 2018-08-06 ENCOUNTER — Other Ambulatory Visit (HOSPITAL_COMMUNITY)
Admission: RE | Admit: 2018-08-06 | Discharge: 2018-08-06 | Disposition: A | Discharge status: Home / Self Care | Payer: Medicare Other | Source: Ambulatory Visit | Attending: General Surgery | Admitting: General Surgery

## 2018-08-06 ENCOUNTER — Encounter (HOSPITAL_COMMUNITY)
Admission: RE | Admit: 2018-08-06 | Discharge: 2018-08-06 | Disposition: A | Discharge status: Home / Self Care | Payer: Medicare Other | Source: Ambulatory Visit | Attending: Hematology | Admitting: Hematology

## 2018-08-06 ENCOUNTER — Telehealth: Payer: Self-pay | Admitting: Hematology

## 2018-08-06 ENCOUNTER — Other Ambulatory Visit: Payer: Self-pay

## 2018-08-06 ENCOUNTER — Encounter (HOSPITAL_BASED_OUTPATIENT_CLINIC_OR_DEPARTMENT_OTHER): Payer: Self-pay | Admitting: *Deleted

## 2018-08-06 DIAGNOSIS — Z79899 Other long term (current) drug therapy: Secondary | ICD-10-CM | POA: Insufficient documentation

## 2018-08-06 DIAGNOSIS — C8333 Diffuse large B-cell lymphoma, intra-abdominal lymph nodes: Secondary | ICD-10-CM | POA: Diagnosis present

## 2018-08-06 DIAGNOSIS — Z1159 Encounter for screening for other viral diseases: Secondary | ICD-10-CM | POA: Insufficient documentation

## 2018-08-06 LAB — GLUCOSE, CAPILLARY: Glucose-Capillary: 73 mg/dL (ref 70–99)

## 2018-08-06 MED ORDER — FLUDEOXYGLUCOSE F - 18 (FDG) INJECTION
7.5200 | Freq: Once | INTRAVENOUS | Status: AC | PRN
  Administered 2018-08-06: 7.52 via INTRAVENOUS

## 2018-08-06 NOTE — Telephone Encounter (Signed)
Scheduled appt per sch msg. Called and spoke with patient, however was not able to give details of appt. He needed to call me back.

## 2018-08-07 ENCOUNTER — Telehealth: Payer: Self-pay | Admitting: *Deleted

## 2018-08-07 ENCOUNTER — Other Ambulatory Visit (HOSPITAL_COMMUNITY): Payer: Medicare Other

## 2018-08-07 NOTE — Telephone Encounter (Signed)
Patient left voice mail asking when his appt with Dr. Irene Limbo to review results of PET is scheduled.  Attempted to contact patient. LVM that next appt w/Dr.Kale is Wednesday 6/3 at 2 pm.

## 2018-08-07 NOTE — Progress Notes (Signed)
HEMATOLOGY/ONCOLOGY CLINIC NOTE  Date of Service: 08/08/2018  Patient Care Team: Jack Fuentes as PCP - General (Physician Assistant)  CHIEF COMPLAINTS/PURPOSE OF CONSULTATION:  Diffuse large B-Cell Lymphoma  Oncologic History:  [name] was diagnosed with Stage IV Diffuse Large B-Cell Lymphoma with a retroperitoneum biopsy on September 04, 1998 and was treated at [facility]. He received  4 cycles of CHOP followed by 2 cycles of R-CHOP, then R-ICE, and then 25 fractions of involved site radiation. Has been followed by oncology at [facility], [place], and [place] with PET scan every few years until 2017.  HISTORY OF PRESENTING ILLNESS:   Jack Fuentes is a wonderful 73 y.o. male who has been referred to [place] by Jack Augusta, PA-C for evaluation and management of Diffuse Large B-Cell Lymphoma. The pt reports that he is doing well overall.   Prior to today's visit the pt presented to the ED on 06/29/18 and was admitted until 07/13/18. He initially presented with nausea and vomiting for 12 days, and workup revealed a high grade small bowel obstruction on CT imaging as noted below. The pt had a exploratory laparotomy, lysis adhesion and small bowel resection with resection of calcified mesenteric mass. The pathology of the resected mass revealed Diffuse Large B-Cell Lymphoma.  The pt reports that he has been healing well after his recent surgery and notes that his incision has been healing well and he still has his surgical drain and will have his staples removed tomorrow. Notes emptying drain with clear fluid about twice a day. He notes that on 06/19/18 he began throwing up for a week. The pt notes that he had not had bowel obstruction symptoms in the past. He notes that he has had episodes of vomiting a few times a year for the past 4-5 years. He notes that he has had some over the last couple days, and is now eating solid food. He notes that he is having a bowel movement every 3  hours, and is not taking anything for this. He notes that his appetite is good and he is beginning to eat better.  He notes that his baseline is 175 pounds, and today weighs 148 pounds today. He notes that he lives independently at home alone. He is currently at a skilled nursing facility for rehab following his recent surgery. He notes that prior to these recent changes, he maintained a pretty active lifestyle with gym visits and frequent swimming. He notes that he feels he is currently at 50% of his baseline.  He denies noticing any lumps or bumps, skin rashes, and denies fevers, chills or night sweats. He denies any chest symptoms, SOB or CP.  He takes Crestor and Gabapentin.  The pt notes that he initially presented in 2000 for left flank pain, and notes that this pain had begun a few months prior. 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 He received CHOP for 6 cycles and Rituxan with the last two cycles, followed by R-ICE. He notes his initial tumor shrunk from 12cm to 5cm after CHOP, which prompted R-ICE treatment. He then received 25 fractions of involved site radiation. The pt notes that he then began taking tumeric, and that his soft tissue mass shrunk to 1cm about 1-2 years after RT.  Of note prior to the patient's visit today, pt has had a CT A/P completed on 06/29/18 with results revealing "High-grade small bowel obstruction.  Massive distension of the stomach, duodenum and proximal small bowel. Transition point is associated with the large mesenteric calcifications in the mid abdomen. Distal small bowel is decompressed. Mild small bowel wall thickening centered around the mesenteric calcifications. 2. Massive central mesenteric calcifications that have minimally changed since 2008. Based on the history of lymphoma, findings are most compatible with treated disease. No evidence for lymphoma recurrence. 3. Chronic atrophy of the left  kidney with dilatation of the left renal collecting system, no significant change since 2008. 4.  Aortic Atherosclerosis 5. Prostate enlargement."  Most recent lab results (07/12/18) of CBC and CMP is as follows: all values are WNL except for RBC at 3.02, HGB at 8.4, HCT at 26.9, CO2 at 19, Calcium at 7.9, Total Protein at 4.5, Albumin at 1.7, ALT at 53.  On review of systems, pt reports diarrhea, weight loss, improved eating, improving energy levels, and denies fevers, chills, night sweats, CP, SOB, noticing any new lumps or bumps, and any other symptoms.   On PMHx the pt reports Stage IV DLBCL diagnosed in 2000 s/p R-CHOP, R-ICE, and RT. Left bundle branch block. 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 Low testosterone. Prostate hypertrophy. On Social Hx the pt reports occasional alcohol consumption, and denies ever smoking cigarettes. Previously worked as a Materials engineer, and Comptroller in the [employer]. On Family Hx the pt reports sister with multiple myeloma.  Interval History:   Jack Fuentes returns today for management and evaluation of his newly diagnosed Diffuse Large B-Cell Lymphoma. The patient's last visit with [place] was on 07/18/18. The pt reports that he is doing well overall.  The pt reports that his staples have been removed after his recent abdominal surgery, and denies remaining pain, however he notes some remaining skin inflammation and redness at his surgical site. He notes that he has continued to eat well. He was discharged home from the SNF about 2-3 weeks ago. The pt notes that began taking 1082mg Vitamin B12 daily and is not yet taking an iron supplement.  The pt notes that he is cooking for himself and is functioning well at home. He is walking his dogs each day.  The pt notes that he saw a cardiologist several years ago. He endorses a history of a left bundle branch block, but has not had an angiogram. He was prescribed Plavix and took this for a few  years, before "weaning himself off," and then resuming this for the last 2.5 years. He takes Crestor as well.  The pt takes Gabapentin for his neuropathy which he has had since his first chemotherapy treatments in 2000.  Of note since the patient's last visit, pt has had a PET/CT completed on 08/06/18 with results revealing "Two concerning hypermetabolic sites within the abdomen. One, at the resection margin in the central abdomen there is a focus of intense metabolic activity which is greater than expected than postsurgical inflammation. This is adjacent to the central calcified lesion the majority of which is been removed removed. 2. A second hypermetabolic focus along the ventral abdominal wound with scattered calcifications. This lesion is also intense than expected for postsurgical inflammation. 3. Recommend close attention on follow-up of these 2 lesions which could represent residual lymphoma. The a ventral surgical wound would be assessable to biopsy. 4. No evidence of metastatic disease outside of the abdomen pelvis. 5. Normal spleen and bone marrow."  Lab results (07/18/18) of CBC w/diff and CMP is as follows: all values are WNL  except for RBC at 3.66, HGB at 10.2, HCT at 32.1, PLT at 417k, Eosinophils abs at 700, Potassium at 3.3, CO2 at 19, Calcium at 8.4, Total Protein at 6.1, Albumin at 2.5, Total bilirubin at 0.2. 07/18/18 Ferritin at 37 and Vitamin B12 at 236 07/18/18 LDH at 155  On review of systems, pt reports low energy levels, eating well, healing surgical incision though with some redness, and denies concerns for infections, abdominal pains, and any other symptoms.   MEDICAL HISTORY:  Past Medical History:  Diagnosis Date   Cancer ([place])    lymphoma-large B-cell   History of left bundle branch block (LBBB)    Hyperlipidemia    Peripheral neuropathy    SBO (small bowel obstruction) ([place]) 06/2018    SURGICAL HISTORY: Past Surgical History:  Procedure Laterality Date    EYE SURGERY     IR PATIENT EVAL TECH 0-60 MINS  08/01/2018   LAPAROTOMY N/A 07/03/2018   Procedure: EXPLORATORY LAPAROTOMY, LYSIS OF ADHESION, BOWEL RESECTION, RESECTION MESTASTATIC TUMOR;  Surgeon: Jovita Kussmaul, MD;  Location: [place];  Service: General;  Laterality: N/A;   NASAL FRACTURE SURGERY      SOCIAL HISTORY: Social History   Socioeconomic History   Marital status: Divorced    Spouse name: Not on file   Number of children: Not on file   Years of education: Not on file   Highest education level: Not on file  Occupational History   Not on file  Social Needs   Financial resource strain: Not on file   Food insecurity:    Worry: Not on file    Inability: Not on file   Transportation needs:    Medical: Not on file    Non-medical: Not on file  Tobacco Use   Smoking status: Never Smoker   Smokeless tobacco: Never Used  Substance and Sexual Activity   Alcohol use: Not Currently   Drug use: Never   Sexual activity: Not on file  Lifestyle   Physical activity:    Days per week: Not on file    Minutes per session: Not on file   Stress: Not on file  Relationships   Social connections:    Talks on phone: Not on file    Gets together: Not on file    Attends religious service: Not on file    Active member of club or organization: Not on file    Attends meetings of clubs or organizations: Not on file    Relationship status: Not on file   Intimate partner violence:    Fear of current or ex partner: Not on file    Emotionally abused: Not on file    Physically abused: Not on file    Forced sexual activity: Not on file  Other Topics Concern   Not on file  Social History Narrative   Not on file    FAMILY HISTORY: Family History  Problem Relation Age of Onset   Heart disease Father    Multiple myeloma Sister     ALLERGIES:  is allergic to levofloxacin and midazolam.  MEDICATIONS:  Current Outpatient Medications  Medication Sig Dispense Refill    acetaminophen (TYLENOL) 325 MG tablet Take 2 tablets (650 mg total) by mouth every 6 (six) hours.     clopidogrel (PLAVIX) 75 MG tablet Take 75 mg by mouth daily.     Ensure (ENSURE) Take 237 mLs by mouth 3 (three) times daily between meals.     gabapentin (NEURONTIN) 600 MG  tablet Take 600 mg by mouth 2 (two) times daily.      methocarbamol (ROBAXIN) 500 MG tablet Take 1 tablet (500 mg total) by mouth every 8 (eight) hours as needed for muscle spasms.     Multiple Vitamin (MULTIVITAMIN WITH MINERALS) TABS tablet Take 1 tablet by mouth daily.     rosuvastatin (CRESTOR) 10 MG tablet Take 10 mg by mouth daily.     saccharomyces boulardii (DAILY PROBIOTIC SUPPLEMENT) 250 MG capsule Take 250 mg by mouth 2 (two) times daily. FOR SUPPLEMENT(DO NOT CRUSH)     Turmeric 500 MG CAPS Take 500 mg by mouth 2 (two) times daily.     No current facility-administered medications for this visit.     REVIEW OF SYSTEMS:    A 10+ POINT REVIEW OF SYSTEMS WAS OBTAINED including neurology, dermatology, psychiatry, cardiac, respiratory, lymph, extremities, GI, GU, Musculoskeletal, constitutional, breasts, reproductive, HEENT.  All pertinent positives are noted in the HPI.  All others are negative.   PHYSICAL EXAMINATION: ECOG PERFORMANCE STATUS: 2 - Symptomatic, <50% confined to bed  . Vitals:   08/08/18 1423  BP: 107/68  Pulse: 68  Resp: 17  Temp: 98.5 F (36.9 C)  SpO2: 98%   Filed Weights   08/08/18 1423  Weight: 145 lb 14.4 oz (66.2 kg)   .Body mass index is 20.93 kg/m.  GENERAL:alert, in no acute distress and comfortable SKIN: no acute rashes, no significant lesions EYES: conjunctiva are pink and non-injected, sclera anicteric OROPHARYNX: MMM, no exudates, no oropharyngeal erythema or ulceration NECK: supple, no JVD LYMPH:  no palpable lymphadenopathy in the cervical, axillary or inguinal regions LUNGS: clear to auscultation b/l with normal respiratory effort HEART: regular rate &  rhythm ABDOMEN: Induration at surgical incision, some erythema Extremity: no pedal edema PSYCH: alert & oriented x 3 with fluent speech NEURO: no focal motor/sensory deficits   LABORATORY DATA:  I have reviewed the data as listed  . CBC Latest Ref Rng & Units 07/18/2018 07/16/2018 07/12/2018  WBC 4.0 - 10.5 K/uL 9.5 10.8 8.1  Hemoglobin 13.0 - 17.0 g/dL 10.2(L) 9.7(A) 8.4(L)  Hematocrit 39.0 - 52.0 % 32.1(L) 29(A) 26.9(L)  Platelets 150 - 400 K/uL 417(H) 376 277    . CMP Latest Ref Rng & Units 07/18/2018 07/12/2018 07/10/2018  Glucose 70 - 99 mg/dL 89 91 103(H)  BUN 8 - 23 mg/dL '10 10 15  ' Creatinine 0.61 - 1.24 mg/dL 0.91 0.81 0.88  Sodium 135 - 145 mmol/L 136 135 136  Potassium 3.5 - 5.1 mmol/L 3.3(L) 3.8 4.0  Chloride 98 - 111 mmol/L 109 109 104  CO2 22 - 32 mmol/L 19(L) 19(L) 23  Calcium 8.9 - 10.3 mg/dL 8.4(L) 7.9(L) 8.0(L)  Total Protein 6.5 - 8.1 g/dL 6.1(L) 4.5(L) -  Total Bilirubin 0.3 - 1.2 mg/dL 0.2(L) 0.4 -  Alkaline Phos 38 - 126 U/L 108 76 -  AST 15 - 41 U/L 24 38 -  ALT 0 - 44 U/L 35 53(H) -    07/03/18 Biopsy:    09/04/1998 Retroperitoneum biopsy:     RADIOGRAPHIC STUDIES: I have personally reviewed the radiological images as listed and agreed with the findings in the report. Nm Pet Image Restag (ps) Skull Base To Thigh  Result Date: 08/06/2018 CLINICAL DATA:  Initial treatment strategy for diffuse large B-cell lymphoma. Remote history of non-Hodgkin's lymphoma. EXAM: NUCLEAR MEDICINE PET SKULL BASE TO THIGH TECHNIQUE: 7.2 mCi F-18 FDG was injected intravenously. Full-ring PET imaging was performed from the skull base  to thigh after the radiotracer. CT data was obtained and used for attenuation correction and anatomic localization. Fasting blood glucose: 73 mg/dl COMPARISON:  CT 06/29/2018 FINDINGS: Mediastinal blood pool activity: SUV max 1.8 Liver activity: SUV max 3.2 NECK: No hypermetabolic lymph nodes in the neck. Incidental CT findings: none CHEST: No  hypermetabolic mediastinal or hilar nodes. No suspicious pulmonary nodules on the CT scan. Incidental CT findings: none ABDOMEN/PELVIS: Interval small-bowel resection. No significant metabolic activity along the side to side bowel anastomosis (image 145/4). The calcified central mesenteric mass has been excised. A small calcified remnant remains in the central mesentery measuring 14 mm (image 133/4. Adjacent to this central mesenteric calcification there is fairly intense metabolic activity SUV max equal 7.6. No clear lesion at this site. There is some thickening of the mesentery (image 133/4). There is no hypermetabolic abdominopelvic lymph nodes. No additional hypermetabolic mesenteric mass. There is a focus of metabolic activity along the ventral abdominal wound which is intense with SUV max equal 8.6. There is mild thickening 21.6 cm with scattered calcifications. This lesion is more intense than expected for postsurgical inflammation. There several scattered calcifications at this site (image 142/4). No abnormal metabolic activity liver. The spleen is normal volume and normal metabolic activity Incidental CT findings: The LEFT kidney is atrophic. There several calcified retroperitoneal lymph nodes on the LEFT which are not metabolic. SKELETON: No focal hypermetabolic activity to suggest skeletal metastasis. Incidental CT findings: none IMPRESSION: 1. Two concerning hypermetabolic sites within the abdomen. One, at the resection margin in the central abdomen there is a focus of intense metabolic activity which is greater than expected than postsurgical inflammation. This is adjacent to the central calcified lesion the majority of which is been removed removed. 2. A second hypermetabolic focus along the ventral abdominal wound with scattered calcifications. This lesion is also intense than expected for postsurgical inflammation. 3. Recommend close attention on follow-up of these 2 lesions which could represent  residual lymphoma. The a ventral surgical wound would be assessable to biopsy. 4. No evidence of metastatic disease outside of the abdomen pelvis. 5. Normal spleen and bone marrow. Electronically Signed   By: Suzy Bouchard M.D.   On: 08/06/2018 16:06   Ir Patient Eval Tech 0-60 Mins  Result Date: 08/01/2018 Chipper Oman     08/01/2018 10:00 AM Patient came in today for a removal of a tunneled PICC line.  The patient did not have a tunneled PICC. He had a PICC line placed my IV team at [facility].  I verified with Dr Marlou Starks that he did not need the PICC for the patient's scheduled upcoming surgery.  Dr Marlou Starks requested we go ahead and pull the PICC.  PICC line was successfully removed. Length verified at 41 cm.  Documented in LDAs.  Patient had no concerns or questions.   ASSESSMENT & PLAN:   73 y.o. male with  1. History Stage IV Diffuse Large B-cell Lymphoma 09/04/1998 Retroperitoneum biopsy revealed Diffuse Large B-Cell Lymphoma S/p 4 cycles of CHOP followed by 2 cycles of R-CHOP, then R-ICE, and 25 fractions of ISRT at [facility]  2. Newly diagnosed Diffuse Large B-Cell Lymphoma S/p 07/03/18 exploratory laparotomy, lysis of adhesion, small bowel resection with resection of calcified mesenteric mass  06/29/18 CT A/P revealed "High-grade small bowel obstruction. Massive distension of the stomach, duodenum and proximal small bowel. Transition point is associated with the large mesenteric calcifications in the mid abdomen. Distal small bowel is decompressed. Mild small bowel wall  thickening centered around the mesenteric calcifications. 2. Massive central mesenteric calcifications that have minimally changed since 2008. Based on the history of lymphoma, findings are most compatible with treated disease. No evidence for lymphoma recurrence. 3. Chronic atrophy of the left kidney with dilatation of the left renal collecting system, no significant change since 2008. 4.  Aortic Atherosclerosis 5. Prostate  enlargement."  07/03/18 Biopsy revealed Diffuse Large B-Cell Lymphoma  PLAN: -Discussed pt labwork from 07/18/18; HGB improved to 10.2 and Ferritin slightly low at 37. PLT reactively high from recent surgery at 417k. LDH normal at 155. Vitamin B12 a little low at 236 -07/18/18 Hep B, Hep C negative -Discussed the 07/31/18 ECHO which revealed LV EF of 45-50% -Discussed the 08/06/18 PET/CT which revealed "Two concerning hypermetabolic sites within the abdomen. One, at the resection margin in the central abdomen there is a focus of intense metabolic activity which is greater than expected than postsurgical inflammation. This is adjacent to the central calcified lesion the majority of which is been removed. 2. A second hypermetabolic focus along the ventral abdominal wound with scattered calcifications. This lesion is also intense than expected for postsurgical inflammation. 3. Recommend close attention on follow-up of these 2 lesions which could represent residual lymphoma. The a ventral surgical wound would be assessable to biopsy. 4. No evidence of metastatic disease outside of the abdomen pelvis. 5. Normal spleen and bone marrow." -Discussed that it is not clear if the patient's inflammation from his previous surgery is confounding the picture.  -Discussed that even if the two abdominal hypermetabolic lesions are not lymphoma, there would still be a role for  3-4 cycles of chemotherapy, as surgery is not considered curative treatment for lymphomas. -Discussed the patient's previous chemotherapy exposures- he has had R-CHOP and R-ICE. Discussed that the patient's lymphoma is considered a new occurrence, rather than a recurrence of his previous disease, given the 20 year interval. -Recommend anthracycline sparing regimen, in preference for platinum containing compounds like in GCD regimen. Polatuzumab ? Less inclined to consider inpatient dose reduced EPOCH-R. Would not recommend R-CHOP given cardiac concerns,  especially in absence of bulky disease. -Cardiomyopathy and neuropathy as considerations for treatment choice as well. -Discussed that given the uniqueness of the patient's history and presentation, it would be reasonable to consider a second opinion if he would like, which he notes he will consider. -Recommend pt begin outpatient PT, our nutritional therapist Ernestene Kiel, and connect with Cardiology given recent ECHO and hx of left bundle branch block prior to initiating chemotherapy -Will discuss this patient's case with our tumor board -Again discussed recommendation to optimize functional status with eating well and increasing activity -Recommend 154m Iron Polysaccharide po bid  and 10077m Vitamin B12 -Proceed with port placement with Dr. PaAutumn Messingn 08/10/18 -Will see the pt back in 2 weeks   3.  Patient Active Problem List   Diagnosis Date Noted   Peripheral neuropathy due to chemotherapy ([facility]/14/2020   Normochromic normocytic anemia 07/18/2018   AKI (acute kidney injury) ([facility]/13/2020   Pressure injury of skin 07/10/2018   Calcified mesenteric mass 07/08/2018   SBO (small bowel obstruction) ([facility]/24/2020  -following with PCP   Referral to BaErnestene Kielor nutrition optimization Referral to outpatient cancer rehab Follow up with surgical post op and port placement Telephone visit with Dr KaIrene Limbon 2 weeks   All of the patients questions were answered with apparent satisfaction. The patient knows to call the clinic with any problems, questions  or concerns.  The total time spent in the appt was 40 minutes and more than 50% was on counseling and direct patient cares.    Sullivan Lone MD MS AAHIVMS [facility] Hematology/Oncology Physician [facility]  (Office):       [phone] (Work cell):  [phone] (Fax):           [phone]  08/08/2018 3:52 PM  I, Baldwin Jamaica, am acting as a scribe for Dr. Sullivan Lone.   .I have reviewed the above  documentation for accuracy and completeness, and I agree with the above. Brunetta Genera MD

## 2018-08-08 ENCOUNTER — Other Ambulatory Visit (HOSPITAL_COMMUNITY): Payer: Self-pay | Admitting: Hematology

## 2018-08-08 ENCOUNTER — Telehealth: Payer: Self-pay | Admitting: *Deleted

## 2018-08-08 ENCOUNTER — Ambulatory Visit
Admission: RE | Admit: 2018-08-08 | Discharge: 2018-08-08 | Disposition: A | Discharge status: Home / Self Care | Payer: Self-pay | Source: Ambulatory Visit | Attending: Hematology | Admitting: Hematology

## 2018-08-08 ENCOUNTER — Other Ambulatory Visit: Payer: Self-pay

## 2018-08-08 ENCOUNTER — Inpatient Hospital Stay: Payer: Medicare Other | Attending: Hematology | Admitting: Hematology

## 2018-08-08 VITALS — BP 107/68 | HR 68 | Temp 98.5°F | Resp 17 | Ht 70.0 in | Wt 145.9 lb

## 2018-08-08 DIAGNOSIS — K56609 Unspecified intestinal obstruction, unspecified as to partial versus complete obstruction: Secondary | ICD-10-CM | POA: Diagnosis not present

## 2018-08-08 DIAGNOSIS — G620 Drug-induced polyneuropathy: Secondary | ICD-10-CM

## 2018-08-08 DIAGNOSIS — C8333 Diffuse large B-cell lymphoma, intra-abdominal lymph nodes: Secondary | ICD-10-CM | POA: Diagnosis present

## 2018-08-08 DIAGNOSIS — C801 Malignant (primary) neoplasm, unspecified: Secondary | ICD-10-CM

## 2018-08-08 DIAGNOSIS — E785 Hyperlipidemia, unspecified: Secondary | ICD-10-CM

## 2018-08-08 DIAGNOSIS — N400 Enlarged prostate without lower urinary tract symptoms: Secondary | ICD-10-CM

## 2018-08-08 DIAGNOSIS — I429 Cardiomyopathy, unspecified: Secondary | ICD-10-CM

## 2018-08-08 DIAGNOSIS — I447 Left bundle-branch block, unspecified: Secondary | ICD-10-CM | POA: Diagnosis not present

## 2018-08-08 DIAGNOSIS — Z79899 Other long term (current) drug therapy: Secondary | ICD-10-CM

## 2018-08-08 DIAGNOSIS — R634 Abnormal weight loss: Secondary | ICD-10-CM | POA: Diagnosis not present

## 2018-08-08 DIAGNOSIS — R197 Diarrhea, unspecified: Secondary | ICD-10-CM | POA: Diagnosis not present

## 2018-08-08 DIAGNOSIS — N179 Acute kidney failure, unspecified: Secondary | ICD-10-CM | POA: Insufficient documentation

## 2018-08-08 LAB — NOVEL CORONAVIRUS, NAA (HOSP ORDER, SEND-OUT TO REF LAB; TAT 18-24 HRS): SARS-CoV-2, NAA: NOT DETECTED

## 2018-08-08 NOTE — Telephone Encounter (Signed)
Patient called - left voice mail stating he had not seen his radiology reports on MyChart yet. Has appt this afternoon to review reports with Dr. Irene Limbo. Asked very specific questions regarding the scan as wants to compare various values in area of small bowel with scan completed in 2017. Also has questions regarding blood tests completed in past. Informed Dr.Kale that patient had multiple questions regarding scan/blood tests. He asked that patient be informed that his concerns will be addressed/discussed in this afternoon's appt.  Attempted to contact patient: LVM for patient with Dr. Grier Mitts statement regarding discussion during appt later today.

## 2018-08-08 NOTE — Patient Instructions (Signed)
Discussed possible treatment options including GCD which stands for Gemcitabine, Carboplatin (or Oxaliplatin) and Dexamethasone vs an inpatient regimen of EPOCH-R, as you have already had R-CHOP and R-ICE treatments.  Recommend continuing to optimize nutrition with our nutritional therapist Jack Fuentes, and also beginning PT.  Recommend seeing Cardiology for further evaluation of left bundle branch block and intention of beginning chemotherapy  Recommend taking 150mg  Iron Polysaccharide and 1086mcg Vitamin B12

## 2018-08-09 ENCOUNTER — Telehealth: Payer: Self-pay | Admitting: Hematology

## 2018-08-09 NOTE — Telephone Encounter (Signed)
Scheduled appt per 6/3 los.  Spoke with patient and he is aware of his appt dates and time and that they are both phone visit only.

## 2018-08-10 ENCOUNTER — Ambulatory Visit (HOSPITAL_BASED_OUTPATIENT_CLINIC_OR_DEPARTMENT_OTHER): Admission: RE | Admit: 2018-08-10 | Payer: Medicare Other | Source: Home / Self Care | Admitting: General Surgery

## 2018-08-10 HISTORY — DX: Hyperlipidemia, unspecified: E78.5

## 2018-08-10 HISTORY — DX: Polyneuropathy, unspecified: G62.9

## 2018-08-10 SURGERY — INSERTION, TUNNELED CENTRAL VENOUS DEVICE, WITH PORT
Anesthesia: General

## 2018-08-15 ENCOUNTER — Inpatient Hospital Stay: Payer: Medicare Other | Admitting: Nutrition

## 2018-08-15 NOTE — Progress Notes (Signed)
Contacted patient by phone and he was unavailable. Left message with my contact information to return call.  Patient returned call.  73 year old male diagnosed with B-cell lymphoma.  Past medical history includes small bowel obstruction with exploratory lap and small bowel resection in April 2020, peripheral neuropathy, hyperlipidemia, history of left bundle branch block.  Medications include multivitamin, Crestor, turmeric, and probiotics.  Labs include potassium 3.3 and albumin 2.5 on May 13.  Height: 5 feet 10 inches. Weight: 145.9 pounds on June 3. Usual body weight: 175 pounds. BMI: 20.93.  Patient reports that he is healing well. He walks 4 times a day and this equals about 1 mile. Reports good appetite and he is eating regular meals with snacks. He has been consuming Ensure. He denies nutrition impact symptoms.  Nutrition diagnosis: Unintended weight loss related to new diagnosis of B-cell lymphoma and small bowel obstruction as evidenced by 17% weight loss from usual body weight.  Intervention: Patient was educated to continue small meals and snacks consisting of adequate calories and protein to promote weight gain. Recommended patient increase Ensure Enlive to twice daily -3 times daily. Support patient's idea to produce smoothies using high-protein foods. Will email fact sheets and contact information.  Questions were answered and teach back method used.  Monitoring, evaluation, goals: Patient will increase calories and protein to minimize weight loss.  Next visit: To be scheduled as needed.  **Disclaimer: This note was dictated with voice recognition software. Similar sounding words can inadvertently be transcribed and this note may contain transcription errors which may not have been corrected upon publication of note.**

## 2018-08-16 ENCOUNTER — Telehealth: Payer: Self-pay | Admitting: *Deleted

## 2018-08-16 NOTE — Telephone Encounter (Signed)
Patient asked that CD of PET/CT from 03/22/2015 be obtained from [facility] in [place], [place]. Patient would like this copy of PET loaded into [facility] records and a comparison be made with most recent PET/CT. Patient sent ROI to [facility] via email and asked that Dr.Kale office send faxed request to [facility] @ [phone], Ander Purpura. Fax sent as requested.

## 2018-08-16 NOTE — Telephone Encounter (Signed)
Fax confirmation received. 

## 2018-08-21 ENCOUNTER — Inpatient Hospital Stay
Admission: RE | Admit: 2018-08-21 | Discharge: 2018-08-21 | Disposition: A | Discharge status: Home / Self Care | Payer: Self-pay | Source: Ambulatory Visit | Attending: Internal Medicine | Admitting: Internal Medicine

## 2018-08-21 ENCOUNTER — Other Ambulatory Visit (HOSPITAL_COMMUNITY): Payer: Self-pay | Admitting: Internal Medicine

## 2018-08-21 ENCOUNTER — Telehealth: Payer: Self-pay | Admitting: *Deleted

## 2018-08-21 DIAGNOSIS — C801 Malignant (primary) neoplasm, unspecified: Secondary | ICD-10-CM

## 2018-08-21 NOTE — Telephone Encounter (Signed)
Received disc of PET Scans completed in 2017 from [facility] in [place], [place] via [facility]. Disc taken to [facility] to be uploaded into patient's medical record.

## 2018-08-21 NOTE — Progress Notes (Signed)
HEMATOLOGY/ONCOLOGY CLINIC NOTE  Date of Service: 08/22/2018  Patient Care Team: Jack Fuentes as PCP - General (Physician Assistant)  CHIEF COMPLAINTS/PURPOSE OF CONSULTATION:  Diffuse large B-Cell Lymphoma  Oncologic History:  Jack Fuentes was diagnosed with Stage IV Diffuse Large B-Cell Lymphoma with a retroperitoneum biopsy on September 04, 1998 and was treated at [facility]. He received  4 cycles of CHOP followed by 2 cycles of R-CHOP, then R-ICE, and then 25 fractions of involved site radiation. Has been followed by oncology at [facility], and [facility] with PET scan every few years until 2017.  HISTORY OF PRESENTING ILLNESS:   Jack Fuentes is a wonderful 73 y.o. male who has been referred to [place] by Jack Augusta, PA-C for evaluation and management of Diffuse Large B-Cell Lymphoma. The pt reports that he is doing well overall.   Prior to today's visit the pt presented to the ED on 06/29/18 and was admitted until 07/13/18. He initially presented with nausea and vomiting for 12 days, and workup revealed a high grade small bowel obstruction on CT imaging as noted below. The pt had a exploratory laparotomy, lysis adhesion and small bowel resection with resection of calcified mesenteric mass. The pathology of the resected mass revealed Diffuse Large B-Cell Lymphoma.  The pt reports that he has been healing well after his recent surgery and notes that his incision has been healing well and he still has his surgical drain and will have his staples removed tomorrow. Notes emptying drain with clear fluid about twice a day. He notes that on 06/19/18 he began throwing up for a week. The pt notes that he had not had bowel obstruction symptoms in the past. He notes that he has had episodes of vomiting a few times a year for the past 4-5 years. He notes that he has had some over the last couple days, and is now eating solid food. He notes that he is having a bowel movement every 3  hours, and is not taking anything for this. He notes that his appetite is good and he is beginning to eat better.  He notes that his baseline is 175 pounds, and today weighs 148 pounds today. He notes that he lives independently at home alone. He is currently at a skilled nursing facility for rehab following his recent surgery. He notes that prior to these recent changes, he maintained a pretty active lifestyle with gym visits and frequent swimming. He notes that he feels he is currently at 50% of his baseline.  He denies noticing any lumps or bumps, skin rashes, and denies fevers, chills or night sweats. He denies any chest symptoms, SOB or CP.  He takes Crestor and Gabapentin.  The pt notes that he initially presented in 2000 for left flank pain, and notes that this pain had begun a few months prior. 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 He received CHOP for 6 cycles and Rituxan with the last two cycles, followed by R-ICE. He notes his initial tumor shrunk from 12cm to 5cm after CHOP, which prompted R-ICE treatment. He then received 25 fractions of involved site radiation. The pt notes that he then began taking tumeric, and that his soft tissue mass shrunk to 1cm about 1-2 years after RT.  Of note prior to the patient's visit today, pt has had a CT A/P completed on 06/29/18 with results revealing "High-grade small bowel  obstruction. Massive distension of the stomach, duodenum and proximal small bowel. Transition point is associated with the large mesenteric calcifications in the mid abdomen. Distal small bowel is decompressed. Mild small bowel wall thickening centered around the mesenteric calcifications. 2. Massive central mesenteric calcifications that have minimally changed since 2008. Based on the history of lymphoma, findings are most compatible with treated disease. No evidence for lymphoma recurrence. 3. Chronic atrophy of the left  kidney with dilatation of the left renal collecting system, no significant change since 2008. 4.  Aortic Atherosclerosis 5. Prostate enlargement."  Most recent lab results (07/12/18) of CBC and CMP is as follows: all values are WNL except for RBC at 3.02, HGB at 8.4, HCT at 26.9, CO2 at 19, Calcium at 7.9, Total Protein at 4.5, Albumin at 1.7, ALT at 53.  On review of systems, pt reports diarrhea, weight loss, improved eating, improving energy levels, and denies fevers, chills, night sweats, CP, SOB, noticing any new lumps or bumps, and any other symptoms.   On PMHx the pt reports Stage IV DLBCL diagnosed in 2000 s/p R-CHOP, R-ICE, and RT. Left bundle branch block. 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 Low testosterone. Prostate hypertrophy. On Social Hx the pt reports occasional alcohol consumption, and denies ever smoking cigarettes. Previously worked as a Materials engineer, and Comptroller in the [employer]. On Family Hx the pt reports sister with multiple myeloma.  Interval History:   I connected with Jack Fuentes on 08/22/18 at  8:40 AM EDT by telephone and verified that I am speaking with the correct person using two identifiers.  I discussed the limitations, risks, security and privacy concerns of performing an evaluation and management service by telemedicine and the availability of in-person appointments. I also discussed with the patient that there may be a patient responsible charge related to this service. The patient expressed understanding and agreed to proceed.   Other persons participating in the visit and their role in the encounter: none  Patient's location: his home Provider's location: my office at the [facility] is called today for management and evaluation of his newly diagnosed Diffuse Large B-Cell Lymphoma. The patient's last visit with [place] was on 08/08/18. The pt reports that he is doing well overall.  The pt reports  that he was not able to see his surgeon in the interim, noting that he postponed his appointment. The pt notes that the redness and hardness around his surgical incision has resolved. He denies any discharge. The pt notes that he has continued eating well and endorses a strong appetite. He notes that he is eating 3 meals a day, and two high calorie snacks following these meals. He has connected with our nutritional therapist and notes that this meeting was helpful. He is going on small walks with his two small dogs 4 times a day and is completing all of his activities of daily living without difficulty. He notes that he is ambulating well without the use of a cane nor walker.  The pt notes that he has begun PO 10052mg Vitamin B12, and PO 1551mIron Polysaccharide.  On review of systems, pt reports eating very well, increasing activity, strong appetite, improving energy levels, and denies discharge, redness at surgical incision, hardness at surgical incision, fatigue, and any other symptoms.   MEDICAL HISTORY:  Past Medical History:  Diagnosis Date   [facility] [place]   lymphoma-large B-cell   History of left bundle branch  block (LBBB)    Hyperlipidemia    Peripheral neuropathy    SBO (small bowel obstruction) ([facility]) 06/2018    SURGICAL HISTORY: Past Surgical History:  Procedure Laterality Date   EYE SURGERY     IR PATIENT EVAL TECH 0-60 MINS  08/01/2018   LAPAROTOMY N/A 07/03/2018   Procedure: EXPLORATORY LAPAROTOMY, LYSIS OF ADHESION, BOWEL RESECTION, RESECTION MESTASTATIC TUMOR;  Surgeon: Jovita Kussmaul, MD;  Location: [facility];  Service: General;  Laterality: N/A;   NASAL FRACTURE SURGERY      SOCIAL HISTORY: Social History   Socioeconomic History   Marital status: Divorced    Spouse name: Not on file   Number of children: Not on file   Years of education: Not on file   Highest education level: Not on file  Occupational History   Not on file  Social Needs   Financial  resource strain: Not on file   Food insecurity    Worry: Not on file    Inability: Not on file   Transportation needs    Medical: Not on file    Non-medical: Not on file  Tobacco Use   Smoking status: Never Smoker   Smokeless tobacco: Never Used  Substance and Sexual Activity   Alcohol use: Not Currently   Drug use: Never   Sexual activity: Not on file  Lifestyle   Physical activity    Days per week: Not on file    Minutes per session: Not on file   Stress: Not on file  Relationships   Social connections    Talks on phone: Not on file    Gets together: Not on file    Attends religious service: Not on file    Active member of club or organization: Not on file    Attends meetings of clubs or organizations: Not on file    Relationship status: Not on file   Intimate partner violence    Fear of current or ex partner: Not on file    Emotionally abused: Not on file    Physically abused: Not on file    Forced sexual activity: Not on file  Other Topics Concern   Not on file  Social History Narrative   Not on file    FAMILY HISTORY: Family History  Problem Relation Age of Onset   Heart disease Father    Multiple myeloma Sister     ALLERGIES:  is allergic to levofloxacin and midazolam.  MEDICATIONS:  Current Outpatient Medications  Medication Sig Dispense Refill   acetaminophen (TYLENOL) 325 MG tablet Take 2 tablets (650 mg total) by mouth every 6 (six) hours.     clopidogrel (PLAVIX) 75 MG tablet Take 75 mg by mouth daily.     Ensure (ENSURE) Take 237 mLs by mouth 3 (three) times daily between meals.     gabapentin (NEURONTIN) 600 MG tablet Take 600 mg by mouth 2 (two) times daily.      methocarbamol (ROBAXIN) 500 MG tablet Take 1 tablet (500 mg total) by mouth every 8 (eight) hours as needed for muscle spasms.     Multiple Vitamin (MULTIVITAMIN WITH MINERALS) TABS tablet Take 1 tablet by mouth daily.     rosuvastatin (CRESTOR) 10 MG tablet Take 10  mg by mouth daily.     saccharomyces boulardii (DAILY PROBIOTIC SUPPLEMENT) 250 MG capsule Take 250 mg by mouth 2 (two) times daily. FOR SUPPLEMENT(DO NOT CRUSH)     Turmeric 500 MG CAPS Take 500 mg by mouth 2 (  two) times daily.     No current facility-administered medications for this visit.     REVIEW OF SYSTEMS:    A 10+ POINT REVIEW OF SYSTEMS WAS OBTAINED including neurology, dermatology, psychiatry, cardiac, respiratory, lymph, extremities, GI, GU, Musculoskeletal, constitutional, breasts, reproductive, HEENT.  All pertinent positives are noted in the HPI.  All others are negative.   PHYSICAL EXAMINATION: ECOG PERFORMANCE STATUS: 2 - Symptomatic, <50% confined to bed  There were no vitals filed for this visit. There were no vitals filed for this visit. .There is no height or weight on file to calculate BMI.  Phone Visit  LABORATORY DATA:  I have reviewed the data as listed  . CBC Latest Ref Rng & Units 07/18/2018 07/16/2018 07/12/2018  WBC 4.0 - 10.5 K/uL 9.5 10.8 8.1  Hemoglobin 13.0 - 17.0 g/dL 10.2(L) 9.7(A) 8.4(L)  Hematocrit 39.0 - 52.0 % 32.1(L) 29(A) 26.9(L)  Platelets 150 - 400 K/uL 417(H) 376 277    . CMP Latest Ref Rng & Units 07/18/2018 07/12/2018 07/10/2018  Glucose 70 - 99 mg/dL 89 91 103(H)  BUN 8 - 23 mg/dL _0 Creatinine 0.61 - 1.24 mg/dL 0.91 0.81 0.88  Sodium 135 - 145 mmol/L 136 135 136  Potassium 3.5 - 5.1 mmol/L 3.3(L) 3.8 4.0  Chloride 98 - 111 mmol/L 109 109 104  CO2 22 - 32 mmol/L 19(L) 19(L) 23  Calcium 8.9 - 10.3 mg/dL 8.4(L) 7.9(L) 8.0(L)  Total Protein 6.5 - 8.1 g/dL 6.1(L) 4.5(L) -  Total Bilirubin 0.3 - 1.2 mg/dL 0.2(L) 0.4 -  Alkaline Phos 38 - 126 U/L 108 76 -  AST 15 - 41 U/L 24 38 -  ALT 0 - 44 U/L 35 53(H) -    07/03/18 Biopsy:    09/04/1998 Retroperitoneum biopsy:     RADIOGRAPHIC STUDIES: I have personally reviewed the radiological images as listed and agreed with the findings in the report. Nm Pet Image Restag (ps)  Skull Base To Thigh  Result Date: 08/06/2018 CLINICAL DATA:  Initial treatment strategy for diffuse large B-cell lymphoma. Remote history of non-Hodgkin's lymphoma. EXAM: NUCLEAR MEDICINE PET SKULL BASE TO THIGH TECHNIQUE: 7.2 mCi F-18 FDG was injected intravenously. Full-ring PET imaging was performed from the skull base to thigh after the radiotracer. CT data was obtained and used for attenuation correction and anatomic localization. Fasting blood glucose: 73 mg/dl COMPARISON:  CT 06/29/2018 FINDINGS: Mediastinal blood pool activity: SUV max 1.8 Liver activity: SUV max 3.2 NECK: No hypermetabolic lymph nodes in the neck. Incidental CT findings: none CHEST: No hypermetabolic mediastinal or hilar nodes. No suspicious pulmonary nodules on the CT scan. Incidental CT findings: none ABDOMEN/PELVIS: Interval small-bowel resection. No significant metabolic activity along the side to side bowel anastomosis (image 145/4). The calcified central mesenteric mass has been excised. A small calcified remnant remains in the central mesentery measuring 14 mm (image 133/4. Adjacent to this central mesenteric calcification there is fairly intense metabolic activity SUV max equal 7.6. No clear lesion at this site. There is some thickening of the mesentery (image 133/4). There is no hypermetabolic abdominopelvic lymph nodes. No additional hypermetabolic mesenteric mass. There is a focus of metabolic activity along the ventral abdominal wound which is intense with SUV max equal 8.6. There is mild thickening 21.6 cm with scattered calcifications. This lesion is more intense than expected for postsurgical inflammation. There several scattered calcifications at this site (image 142/4). No abnormal metabolic activity liver. The spleen is normal volume and normal metabolic activity  Incidental CT findings: The LEFT kidney is atrophic. There several calcified retroperitoneal lymph nodes on the LEFT which are not metabolic. SKELETON: No focal  hypermetabolic activity to suggest skeletal metastasis. Incidental CT findings: none IMPRESSION: 1. Two concerning hypermetabolic sites within the abdomen. One, at the resection margin in the central abdomen there is a focus of intense metabolic activity which is greater than expected than postsurgical inflammation. This is adjacent to the central calcified lesion the majority of which is been removed removed. 2. A second hypermetabolic focus along the ventral abdominal wound with scattered calcifications. This lesion is also intense than expected for postsurgical inflammation. 3. Recommend close attention on follow-up of these 2 lesions which could represent residual lymphoma. The a ventral surgical wound would be assessable to biopsy. 4. No evidence of metastatic disease outside of the abdomen pelvis. 5. Normal spleen and bone marrow. Electronically Signed   By: Suzy Bouchard M.D.   On: 08/06/2018 16:06   Ir Patient Eval Tech 0-60 Mins  Result Date: 08/01/2018 Chipper [place]     08/01/2018 10:00 AM Patient came in today for a removal of a tunneled PICC line.  The patient did not have a tunneled PICC. He had a PICC line placed my IV team at [facility].  I verified with Dr Marlou Starks that he did not need the PICC for the patient's scheduled upcoming surgery.  Dr Marlou Starks requested we go ahead and pull the PICC.  PICC line was successfully removed. Length verified at 41 cm.  Documented in LDAs.  Patient had no concerns or questions.   ASSESSMENT & PLAN:   73 y.o. male with  1. History Stage IV Diffuse Large B-cell Lymphoma 09/04/1998 Retroperitoneum biopsy revealed Diffuse Large B-Cell Lymphoma S/p 4 cycles of CHOP followed by 2 cycles of R-CHOP, then R-ICE, and 25 fractions of ISRT at [facility]  2. Newly diagnosed Diffuse Large B-Cell Lymphoma S/p 07/03/18 exploratory laparotomy, lysis of adhesion, small bowel resection with resection of calcified mesenteric mass  06/29/18 CT A/P revealed "High-grade small  bowel obstruction. Massive distension of the stomach, duodenum and proximal small bowel. Transition point is associated with the large mesenteric calcifications in the mid abdomen. Distal small bowel is decompressed. Mild small bowel wall thickening centered around the mesenteric calcifications. 2. Massive central mesenteric calcifications that have minimally changed since 2008. Based on the history of lymphoma, findings are most compatible with treated disease. No evidence for lymphoma recurrence. 3. Chronic atrophy of the left kidney with dilatation of the left renal collecting system, no significant change since 2008. 4.  Aortic Atherosclerosis 5. Prostate enlargement."  07/03/18 Biopsy revealed Diffuse Large B-Cell Lymphoma  07/18/18 Hep B, Hep C negative 07/31/18 ECHO revealed LV EF of 45-50% 08/06/18 PET/CT revealed "Two concerning hypermetabolic sites within the abdomen. One, at the resection margin in the central abdomen there is a focus of intense metabolic activity which is greater than expected than postsurgical inflammation. This is adjacent to the central calcified lesion the majority of which is been removed. 2. A second hypermetabolic focus along the ventral abdominal wound with scattered calcifications. This lesion is also intense than expected for postsurgical inflammation. 3. Recommend close attention on follow-up of these 2 lesions which could represent residual lymphoma. The a ventral surgical wound would be assessable to biopsy. 4. No evidence of metastatic disease outside of the abdomen pelvis. 5. Normal spleen and bone marrow."  PLAN: -Discussed with the pt the discussion I had with our tumor board regarding  the pt's case. Radiologist felt that inflammation was remaining from surgery, but underlying lymphoma within surgical bed cannot be ruled out. Calcification in regional lymph nodes identified, thought to be related to sequelae of patient's previous RT. -Discussed uptake in area of  surgical bed as resolving uptake from surgery and surgical incision vs active lymphoma. Could biopsy induration in skin incision which may change the staging, which the pt prefers. -Discussed that surgical resection alone is not considered adequate treatment for DLBCL, and therefore I do recommend chemotherapy. -Discussed possible treatment options, informed by his history of past chemotherapy exposure, left bundle branch block, and peripheral neuropathy: either R-GCD vs BR vs Bendamustine and Polatuzumab vedotin though could be limited by pt's neuropathy. -Discussed that completing RT would be limited by his history of previous RT. Would therefore recommend 3-6 cycles, pending tolerance, without RT. Would repeat PET/CT at end of 3 cycles. -Discussed that given the uniqueness of the patient's history and presentation, it would be reasonable to consider a second opinion at a transplant center if he would like, which he notes he will consider over the next 1-2 days. Discussed the time sensitivity of making this decision, to which the pt verbalized understanding. -Recommend pt begin outpatient PT, follow up with our nutritional therapist Ernestene Kiel, and connect with Cardiology given recent ECHO and hx of left bundle branch block prior to initiating chemotherapy -Again discussed recommendation to optimize functional status with eating well and increasing activity. -Recommend 127m Iron Polysaccharide po bid  and 10063m Vitamin B12 -Proceed with port placement with Dr. PaAutumn MessingWill order US[place]oreauided biopsy at surgical incision -Pt will let me know within the next week which transplant center he would like to be referred to for a second opinion   3.  Patient Active Problem List   Diagnosis Date Noted   Peripheral [place] due to chemotherapy ([facility]   Normochromic normocytic anemia 07/18/2018   AKI (acute kidney injury) ([facility]/13/2020   Pressure injury of skin 07/10/2018    Calcified mesenteric mass 07/08/2018   SBO (small bowel obstruction) ([facility]/24/2020  -following with PCP   USKoreauided biopsy of FDG avid abdominal wall lesion on PET/CTin 2-3 days -patient will call to schedule clinic f/u after he makes his mind up   I discussed the assessment and treatment plan with the patient. The patient was provided an opportunity to ask questions and all were answered. The patient agreed with the plan and demonstrated an understanding of the instructions.   The patient was advised to call back or seek an in-person evaluation if the symptoms worsen or if the condition fails to improve as anticipated.  The total time spent in the appt was 50 minutes and more than 50% was on counseling and direct patient cares.    GaSullivan LoneD MS AAHIVMS SCLakewood Surgery Center LLCTSjrh - St Johns Divisionematology/Oncology Physician CoTalbert [facility](Office):       [phone] cell):  [phone]):           [phone]/17/2020 10:03 AM  I, ScBaldwin Jamaicaam acting as a scribe for Dr. GaSullivan Lone  .I have reviewed the above documentation for accuracy and completeness, and I agree with the above. .GBrunetta GeneraD

## 2018-08-22 ENCOUNTER — Telehealth: Payer: Self-pay | Admitting: *Deleted

## 2018-08-22 ENCOUNTER — Inpatient Hospital Stay (HOSPITAL_BASED_OUTPATIENT_CLINIC_OR_DEPARTMENT_OTHER): Payer: Medicare Other | Admitting: Hematology

## 2018-08-22 DIAGNOSIS — R197 Diarrhea, unspecified: Secondary | ICD-10-CM

## 2018-08-22 DIAGNOSIS — G620 Drug-induced polyneuropathy: Secondary | ICD-10-CM

## 2018-08-22 DIAGNOSIS — Z79899 Other long term (current) drug therapy: Secondary | ICD-10-CM

## 2018-08-22 DIAGNOSIS — N179 Acute kidney failure, unspecified: Secondary | ICD-10-CM

## 2018-08-22 DIAGNOSIS — K56609 Unspecified intestinal obstruction, unspecified as to partial versus complete obstruction: Secondary | ICD-10-CM | POA: Diagnosis not present

## 2018-08-22 DIAGNOSIS — C8333 Diffuse large B-cell lymphoma, intra-abdominal lymph nodes: Secondary | ICD-10-CM | POA: Diagnosis not present

## 2018-08-22 DIAGNOSIS — E785 Hyperlipidemia, unspecified: Secondary | ICD-10-CM

## 2018-08-22 DIAGNOSIS — I429 Cardiomyopathy, unspecified: Secondary | ICD-10-CM

## 2018-08-22 DIAGNOSIS — I447 Left bundle-branch block, unspecified: Secondary | ICD-10-CM

## 2018-08-22 DIAGNOSIS — R634 Abnormal weight loss: Secondary | ICD-10-CM | POA: Diagnosis not present

## 2018-08-22 DIAGNOSIS — N400 Enlarged prostate without lower urinary tract symptoms: Secondary | ICD-10-CM

## 2018-08-22 NOTE — Telephone Encounter (Signed)
Contacted by Aniceto Boss in [employer]. Patient is on Plavix. [phone] Needs order from Dr.Kale to hold Plavix for 5 days prior to US/Biopsy so that it can be scheduled.

## 2018-08-23 ENCOUNTER — Telehealth: Payer: Self-pay | Admitting: *Deleted

## 2018-08-23 ENCOUNTER — Telehealth: Payer: Self-pay | Admitting: Hematology

## 2018-08-23 NOTE — Telephone Encounter (Signed)
Per 6/17 los patient will call to schedule clinic f/u after he makes his mind up.

## 2018-08-23 NOTE — Telephone Encounter (Signed)
Contacted by Aniceto Boss in [employer] [phone] stating that they need order from [place] for patient to hold Plavix for 5 days prior to ordered biopsy. Per Dr. Irene Limbo, he cannot give this order as he is not the MD that orders/monitors Plavix. Attempted to contact Tasha with this information - left voice mail at number above.

## 2018-08-29 ENCOUNTER — Encounter (HOSPITAL_COMMUNITY): Payer: Self-pay | Admitting: Radiology

## 2018-08-29 NOTE — Progress Notes (Unsigned)
Left message with office for patients primary care provider [name] at [phone] on two separate occasions in regards to patients Plavix and his biopsy. Left message with office on 08/24/2018 and did not hear anything back. Called again on 08/29/2018 and could not get anyone on the phone.

## 2018-09-05 ENCOUNTER — Encounter: Payer: Medicare Other | Admitting: Nutrition

## 2018-09-12 ENCOUNTER — Telehealth: Payer: Self-pay | Admitting: *Deleted

## 2018-09-12 NOTE — Telephone Encounter (Signed)
Patient LVM: has thought a lot about the options presented by Dr. Irene Limbo. Following extensive research, he has decided to pursue cyber knife radiation therapy - he was presented that as a treatment option in the past.  Asked if Dr. Irene Limbo will refer him to [facility] in [place] for cyber knife radiation therapy. Phone # [phone].  Stated he had radiation and chemotherapy 20 years ago and has been effectively disabled from it. He fears an increase in disability and being unable to live on his own if he has more chemotherapy at this time. He also has concerns related to immune suppression with chemo and does not think that is a good idea during a pandemic.  Dr. Irene Limbo will be sent this message

## 2018-09-13 ENCOUNTER — Telehealth: Payer: Self-pay | Admitting: *Deleted

## 2018-09-13 NOTE — Telephone Encounter (Signed)
Patient called and requested a referral from Dr. Irene Limbo for [facility] (see previous note) Dr. Irene Limbo asked that patient be contacted with following information: He will not refer him as this care is not his recommendation - which it would need to be in order for Dr. Irene Limbo to refer him. This office will provide any and all records  sent to any provider Jack Fuentes  chooses to make appt with --- at patient's request. Attempted to contact patient. Left voice mail with this information on named voice mail.

## 2018-10-05 ENCOUNTER — Telehealth: Payer: Self-pay | Admitting: *Deleted

## 2018-10-05 NOTE — Telephone Encounter (Signed)
Records faxed to [facility] - Release [id number]

## 2018-10-11 DIAGNOSIS — C8333 Diffuse large B-cell lymphoma, intra-abdominal lymph nodes: Secondary | ICD-10-CM | POA: Insufficient documentation

## 2018-11-05 DIAGNOSIS — R768 Other specified abnormal immunological findings in serum: Secondary | ICD-10-CM | POA: Insufficient documentation

## 2020-12-19 IMAGING — CT CT ABDOMEN AND PELVIS WITH CONTRAST
3 of 6 series · 9 of 46 positions shown, 16 images · IV contrast (APPLIED)
Comparison: PET-CT 04/28/2006

CLINICAL DATA: 72-year-old with nausea and vomiting. History of
lymphoma.

EXAM:
CT ABDOMEN AND PELVIS WITH CONTRAST
TECHNIQUE: Multidetector CT imaging of the abdomen and pelvis was performed
using the standard protocol following bolus administration of
intravenous contrast.
CONTRAST:  80mL KC9LDX-XSS IOPAMIDOL (KC9LDX-XSS) INJECTION 61%

[Series 3: abdomen 5.0 · axial · 0.71mm/px · z∈[-164,+136]mm · 5 of 92 slices shown, 10 images]
[im 16/92  soft-tissue]
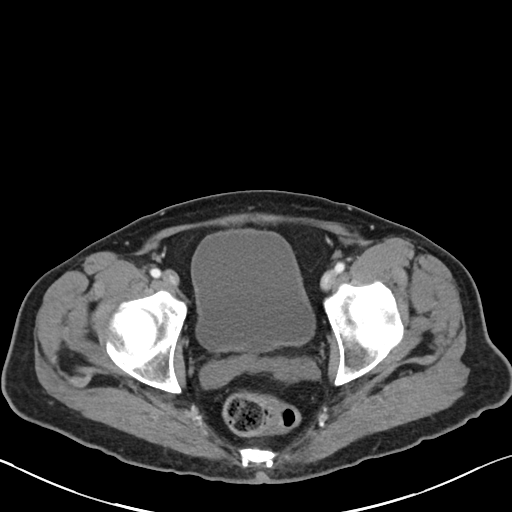
[im 16/92  bone]
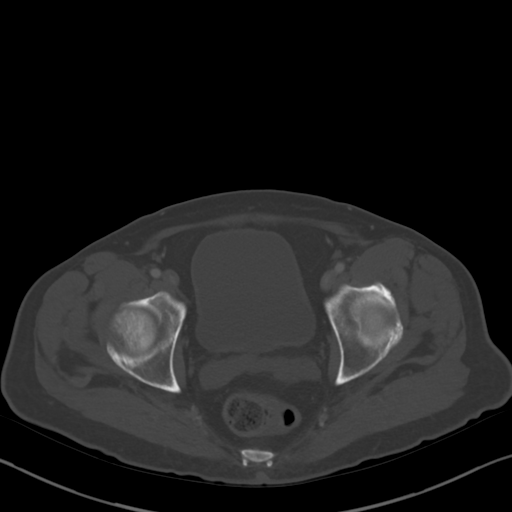
[im 31/92  soft-tissue]
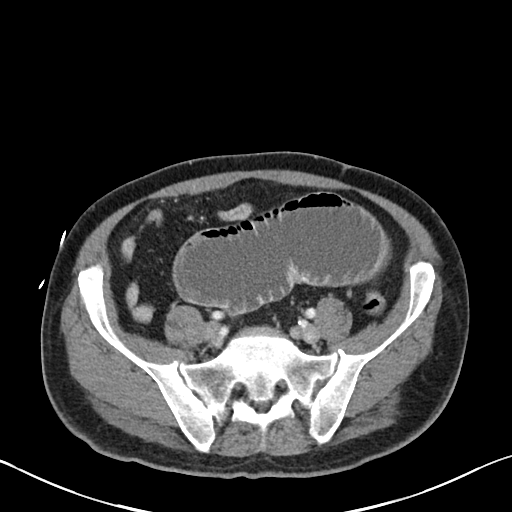
[im 31/92  lung]
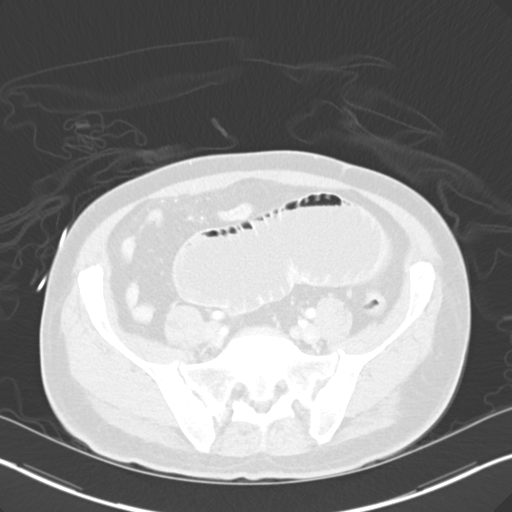
[im 46/92  soft-tissue]
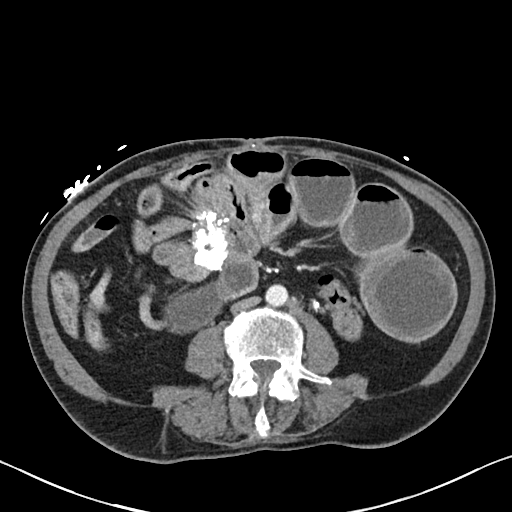
[im 46/92  lung]
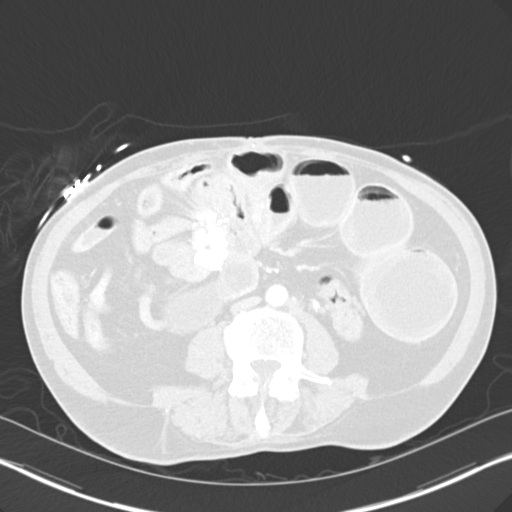
[im 61/92  soft-tissue]
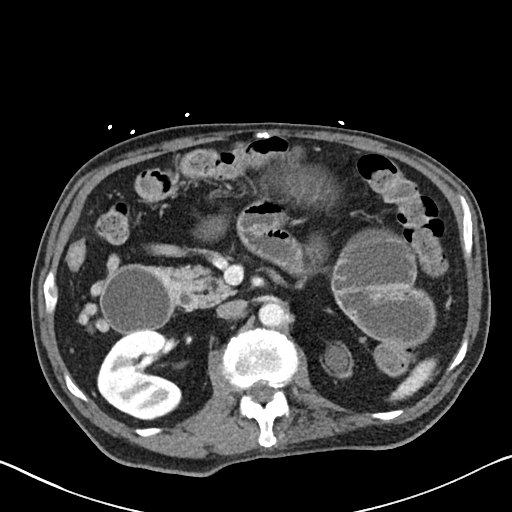
[im 61/92  lung]
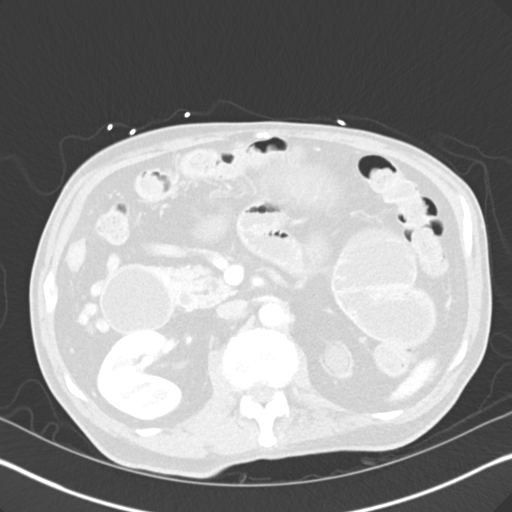
[im 76/92  soft-tissue]
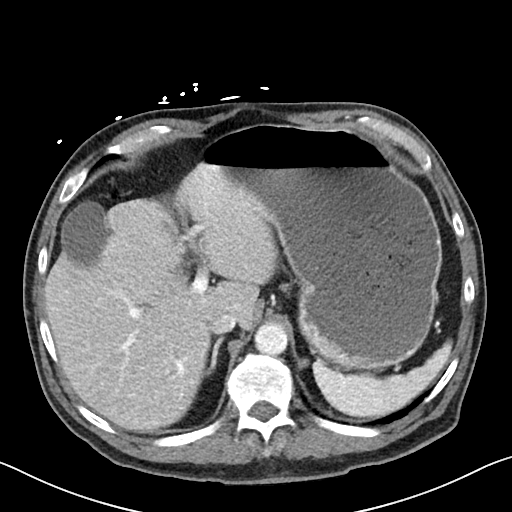
[im 76/92  lung]
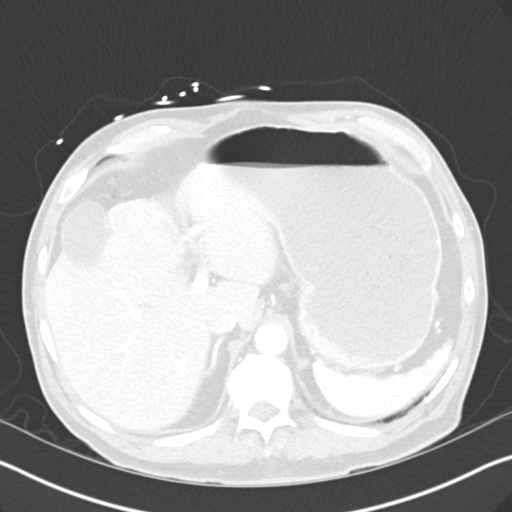

[Series 6: abdomen 3.0 mpr cor · coronal · 0.99mm/px · 3 of 116 slices shown, 4 images]
[im 39/116  soft-tissue]
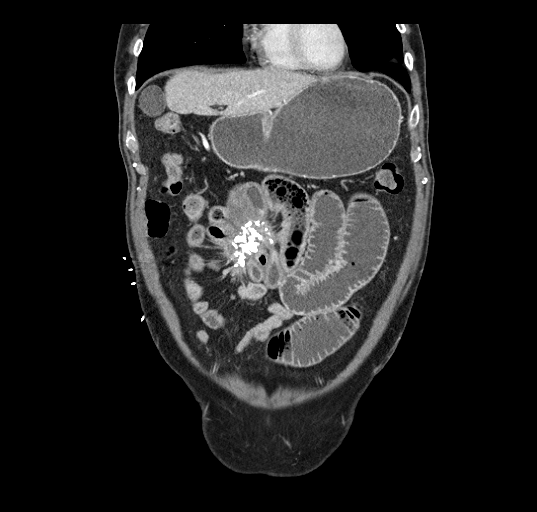
[im 52/116  soft-tissue]
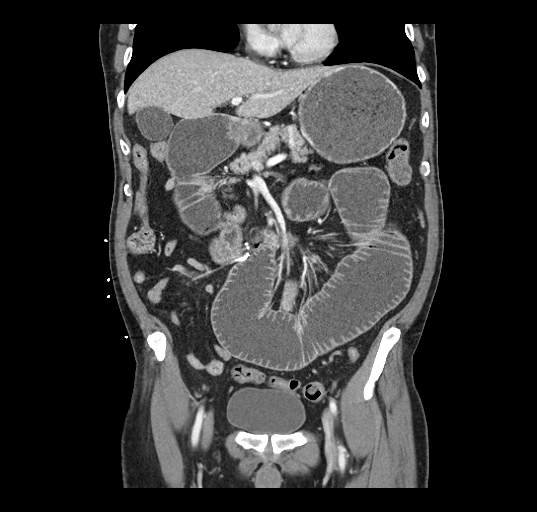
[im 52/116  bone]
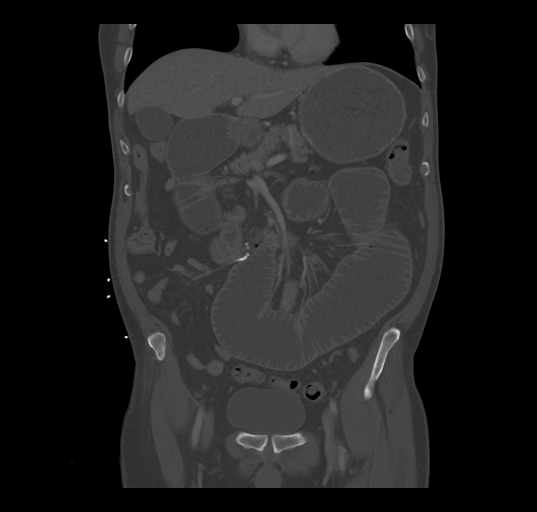
[im 64/116  soft-tissue]
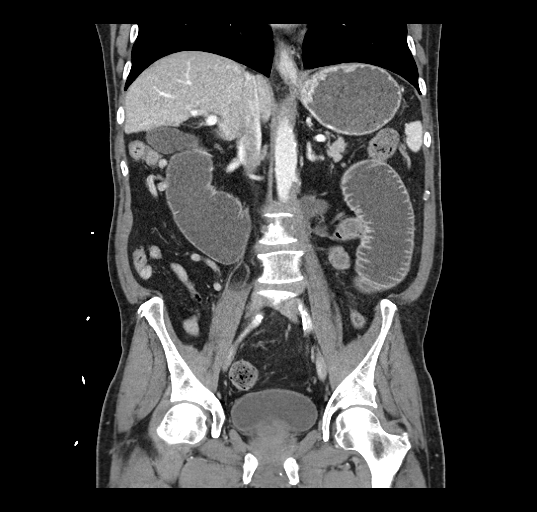

[Series 7: abdomen 3.0 mpr sag · sagittal · 0.91mm/px · 1 of 130 slices shown, 2 images]
[im 44/130  soft-tissue]
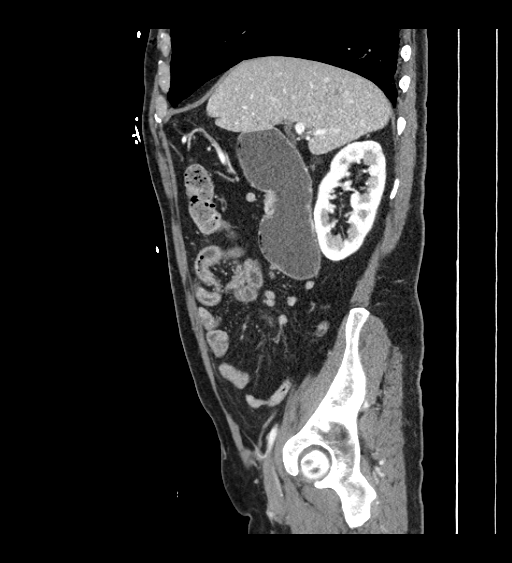
[im 44/130  bone]
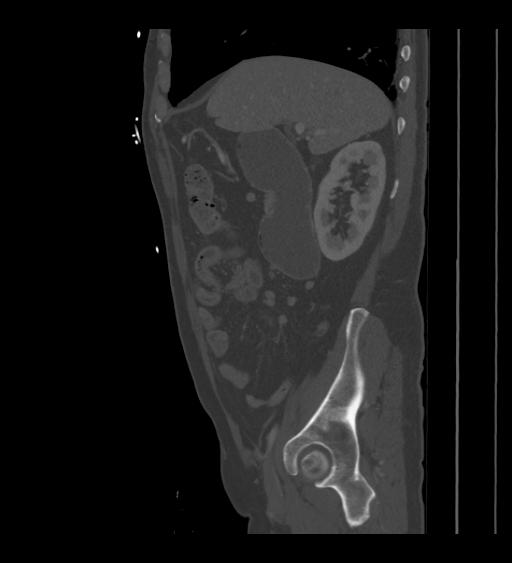

[9 of 46 positions shown; findings below may reference images not displayed]

FINDINGS: Lower chest: Lung bases are clear.

Hepatobiliary: Normal appearance of the liver, gallbladder and
portal venous system. No biliary dilatation.

Pancreas: Unremarkable. No pancreatic ductal dilatation or
surrounding inflammatory changes.

Spleen: Normal in size without focal abnormality.

Adrenals/Urinary Tract: Normal adrenal glands. Chronic atrophy of
the left kidney. Chronic dilatation of the left renal pelvis and
left renal calices. Small hypodensities in right kidney are too
small to definitively characterize. Negative for right
hydronephrosis. Urinary bladder is unremarkable.

Stomach/Bowel: Stomach and duodenum are distended. Duodenum measures
up to 5.5 cm in diameter. Markedly dilated proximal jejunum
measuring up to 6.5 cm on sequence 3, image 64. Small bowel loops in
the central abdomen have mild wall thickening with mucosal
enhancement. The distal small bowel and terminal ileum are
decompressed. Multiple small bowel loops are encircling large
clusters of central mesenteric calcifications. Findings are
compatible with a high-grade small bowel obstruction. The colon is
decompressed. There is a retrocecal appendix without inflammation.

Vascular/Lymphatic: Atherosclerotic calcifications in the aorta and
iliac arteries without aneurysm. Soft tissue thickening along the
left side of the aorta at the level of the atrophic kidney is
probably associated with the atrophic kidney and similar to the
prior examination. Increased left periaortic calcifications. Again
noted are extensive central mesenteric calcifications associated
with small mesenteric lymph nodes. There was a similar finding on
the study from 2990 although the calcifications may have slightly
progressed. Mesenteric calcifications are poorly defined but roughly
measures 4.9 x 4.1 cm on sequence 3, image 43.

Reproductive: Prostate enlargement measuring 6.9 cm in the short
axis.

Other: Negative for free fluid.  Negative for free air.

Musculoskeletal: No acute bone abnormality.
IMPRESSION: 1. High-grade small bowel obstruction. Massive distension of the
stomach, duodenum and proximal small bowel. Transition point is
associated with the large mesenteric calcifications in the mid
abdomen. Distal small bowel is decompressed. Mild small bowel wall
thickening centered around the mesenteric calcifications.
2. Massive central mesenteric calcifications that have minimally
changed since 2990. Based on the history of lymphoma, findings are
most compatible with treated disease. No evidence for lymphoma
recurrence.
3. Chronic atrophy of the left kidney with dilatation of the left
renal collecting system, no significant change since [DATE].  Aortic Atherosclerosis ([id number]).
5. Prostate enlargement.

## 2020-12-21 IMAGING — DX PORTABLE ABDOMEN - 1 VIEW
2 series · 2 of 2 positions shown · non-contrast
Comparison: 06/30/2018

CLINICAL DATA: Small-bowel obstruction.

EXAM:
PORTABLE ABDOMEN - 1 VIEW

[abdomen kub (1 of 2)]
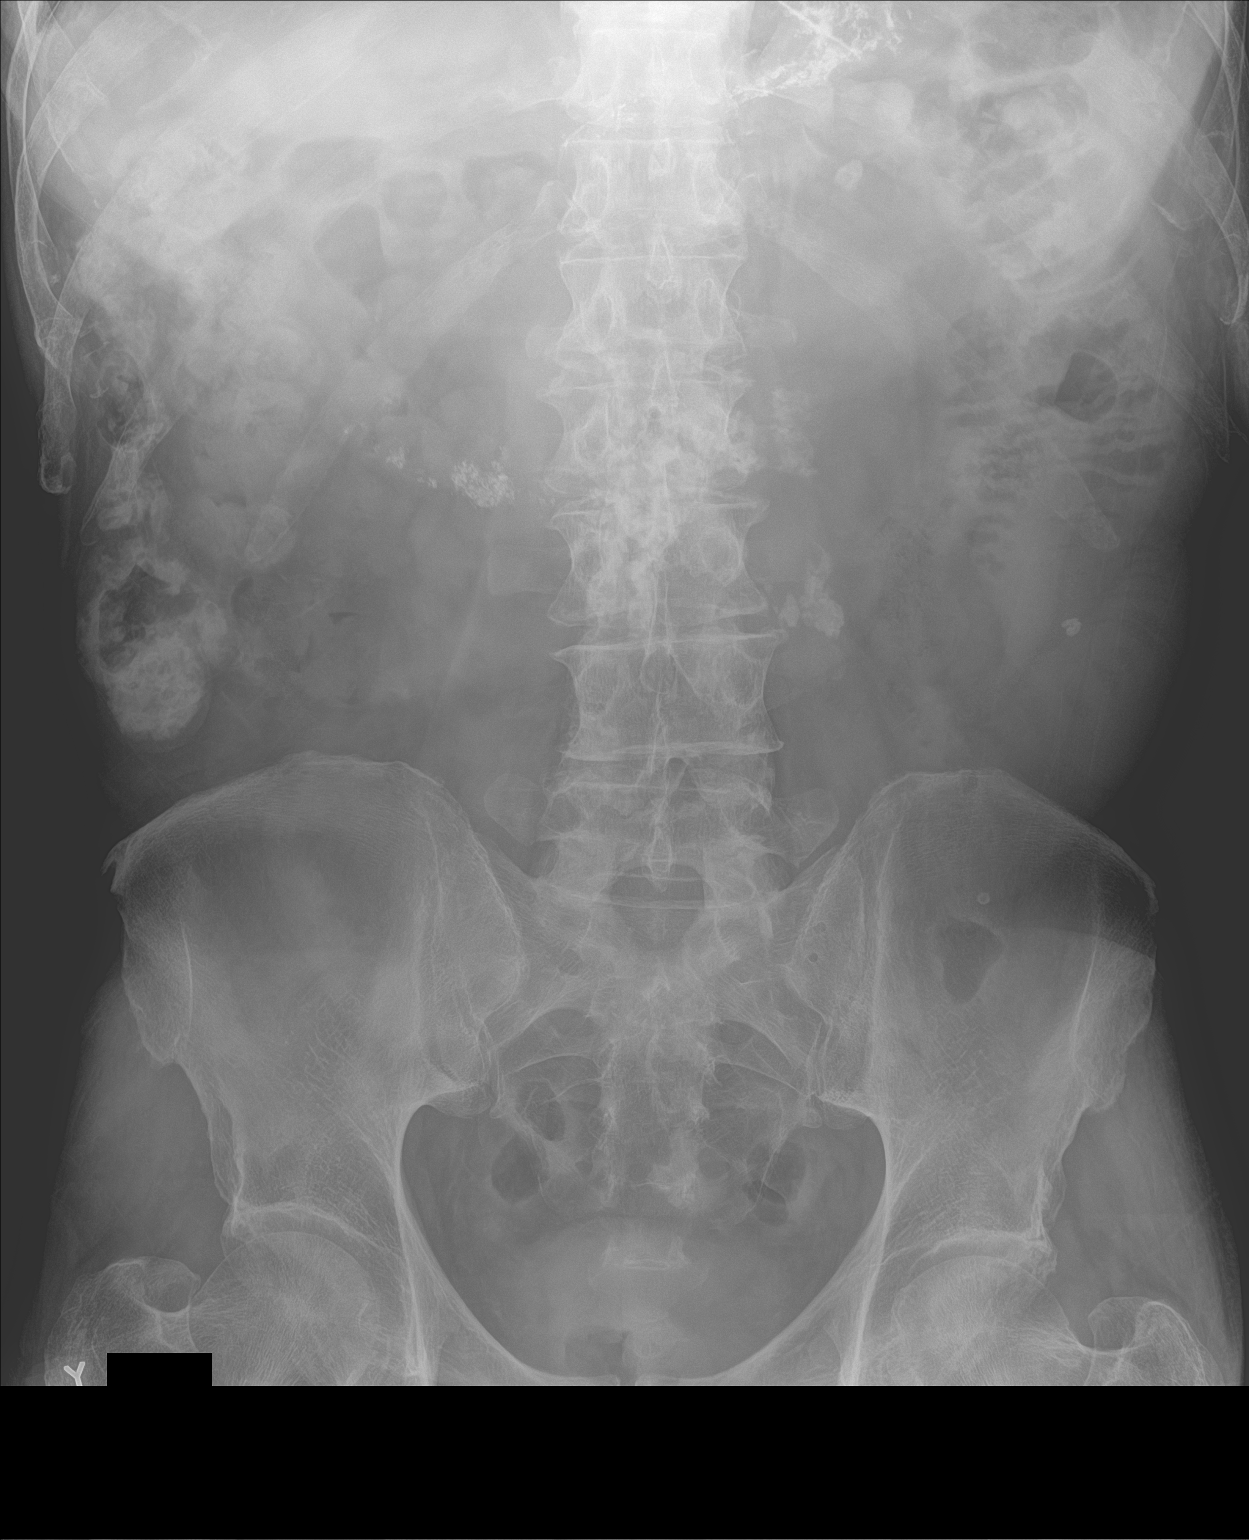

[abdomen kub (2 of 2)]
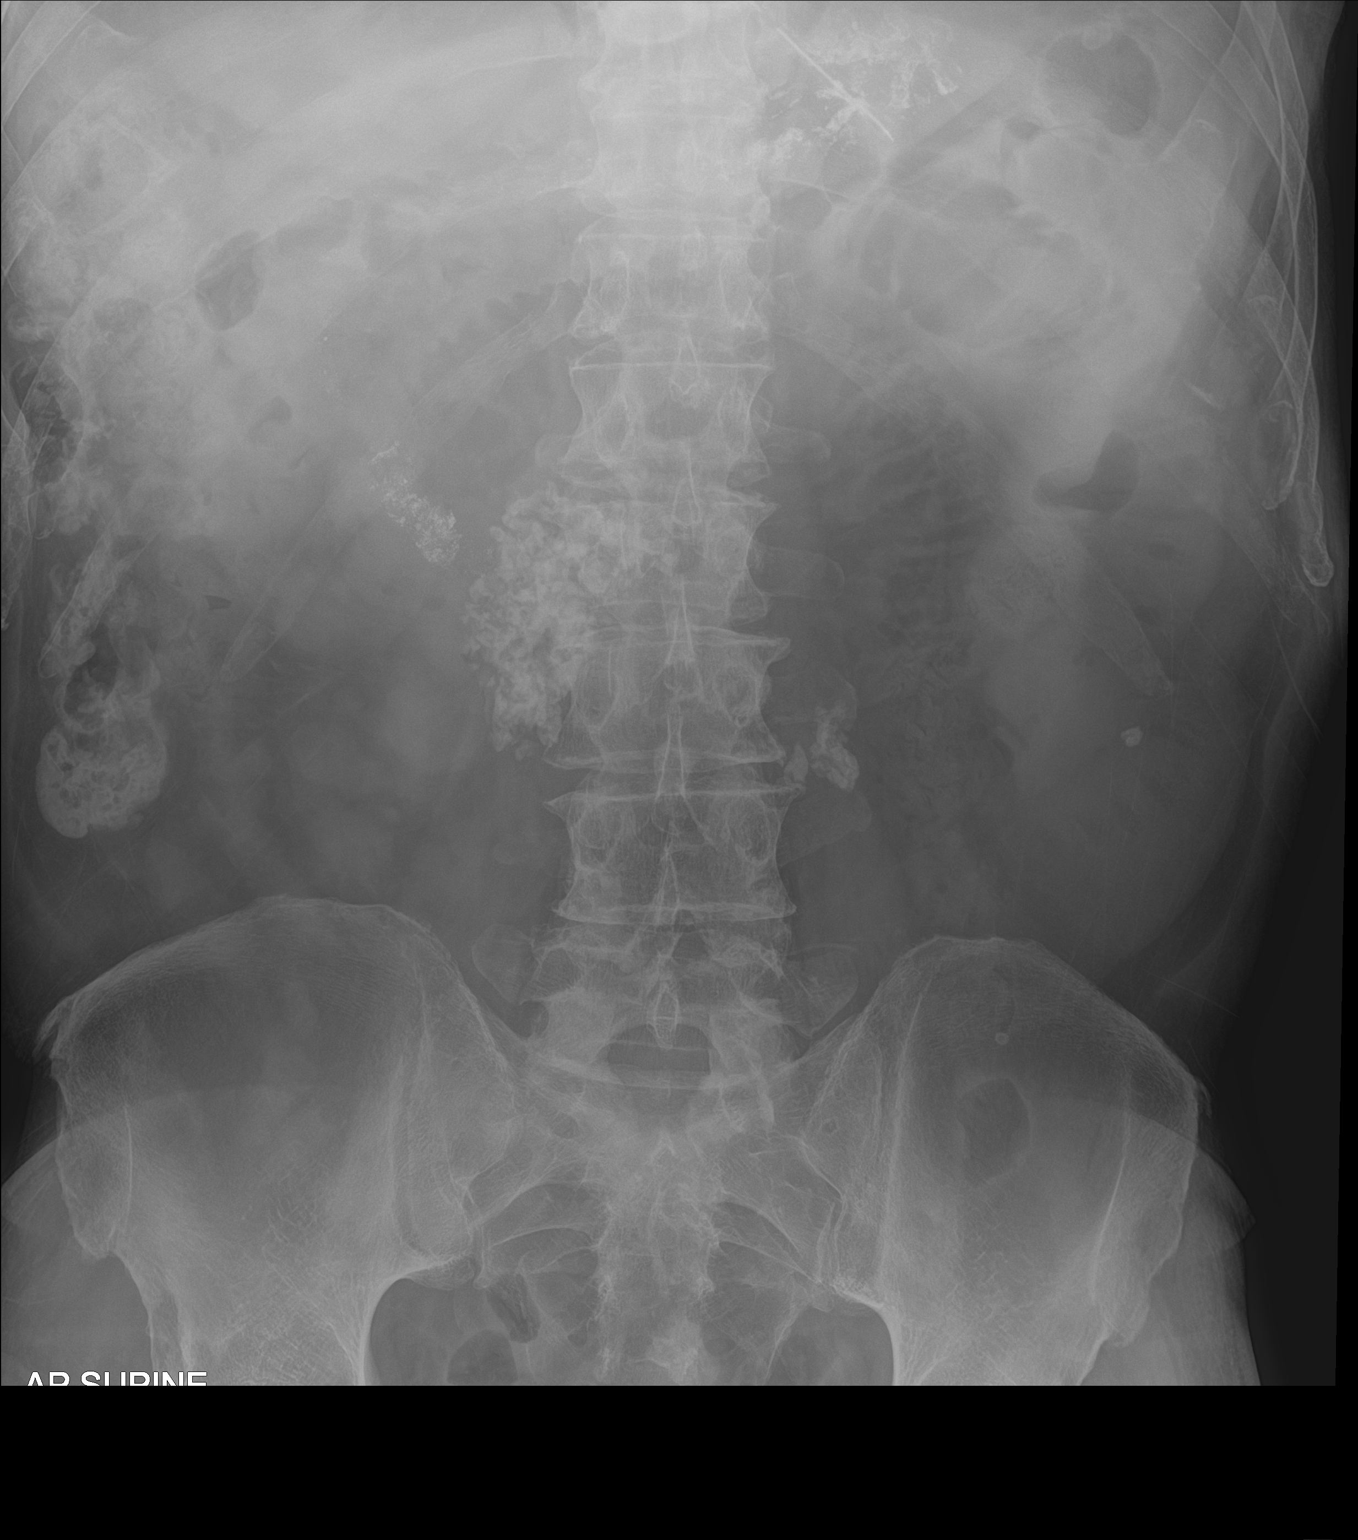

[2 of 2 positions shown; findings below may reference images not displayed]

FINDINGS: Nasogastric tube tip lies in the proximal stomach. The stomach
appears decompressed. Gas pattern is nonspecific. Mid abdominal
calcifications are stable.
IMPRESSION: Nasogastric tube tip lies in the proximal stomach. Bowel gas pattern
is nonobstructive.

## 2020-12-22 IMAGING — DX PORTABLE ABDOMEN - 1 VIEW
1 series · 2 of 2 positions shown · non-contrast
Comparison: CT 06/29/2018.  Abdominal radiograph 07/01/2018

CLINICAL DATA: Small bowel obstruction.

EXAM:
PORTABLE ABDOMEN - 1 VIEW

[Series 1: abdomen · 0.14mm/px · 2 of 2 slices shown]
[im 1/2]
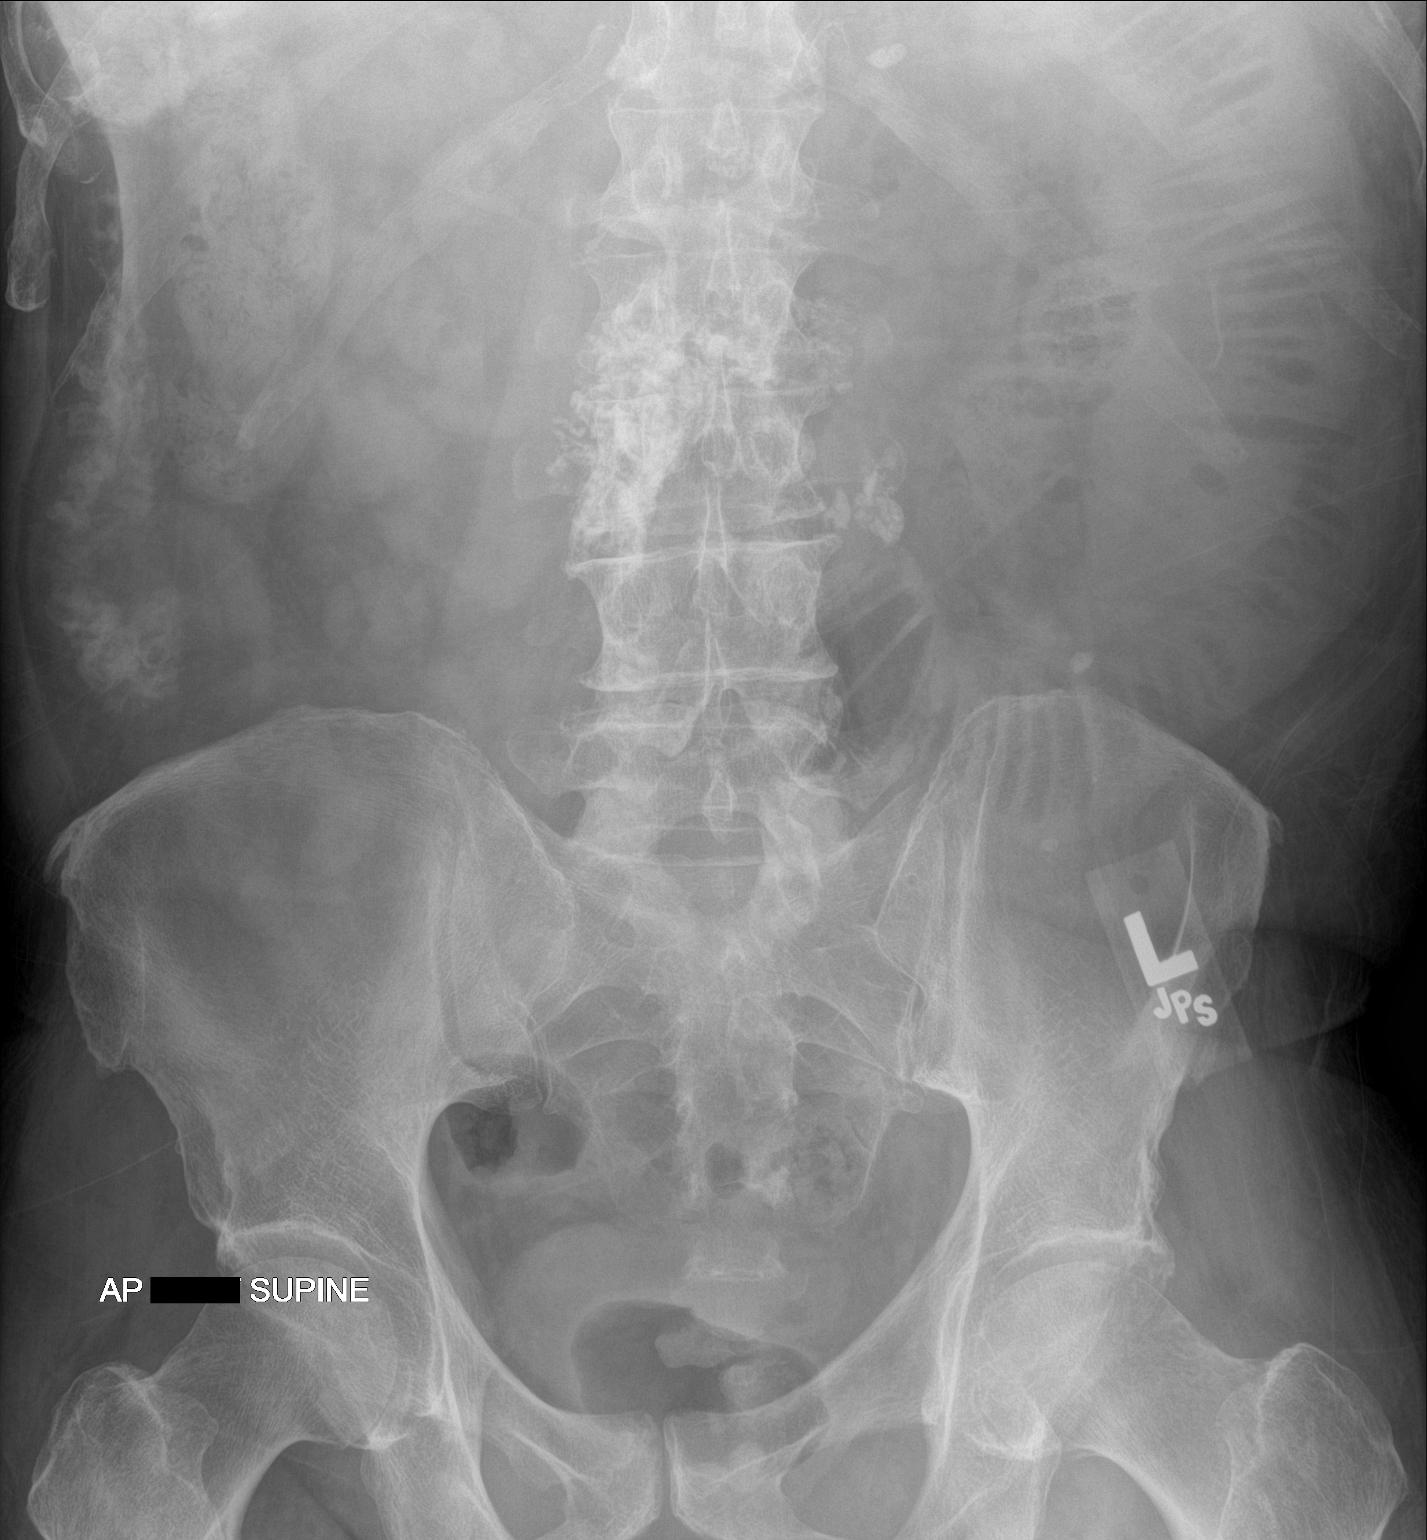
[im 2/2]
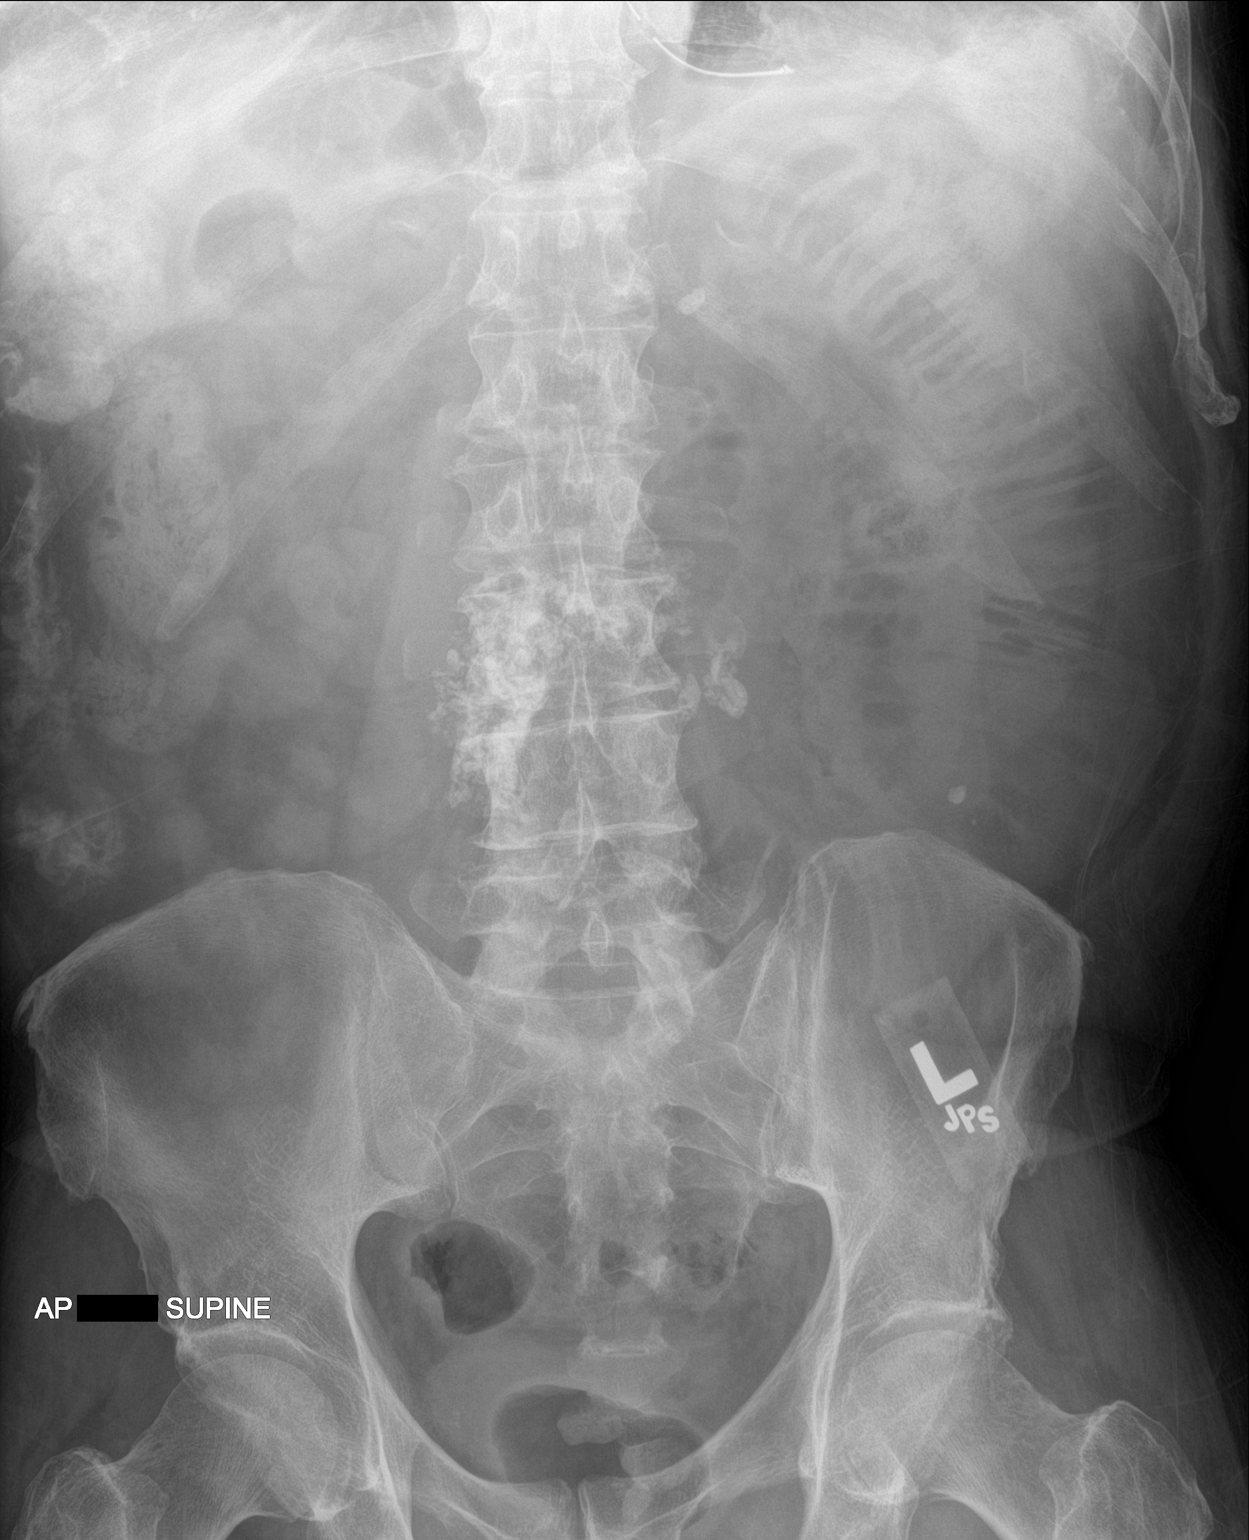

[2 of 2 positions shown; findings below may reference images not displayed]

FINDINGS: Persistent gas-filled dilated loops of small bowel in the left
abdomen. Again noted are numerous mesenteric calcifications. A
nasogastric tube is not identified on this single supine image.
There is gas in the rectum. Limited evaluation for free air on this
supine image.
IMPRESSION: Dilated gas-filled loops of small bowel in left abdomen. Findings
remain compatible with a small bowel obstruction.

## 2020-12-23 IMAGING — DX PORTABLE ABDOMEN - 1 VIEW
1 series · 1 of 1 positions shown · non-contrast
Comparison: July 02, 2018 KUB.  June 29, 2018 CT scan.

CLINICAL DATA: Small bowel obstruction.

EXAM:
PORTABLE ABDOMEN - 1 VIEW

[abdomen kub]
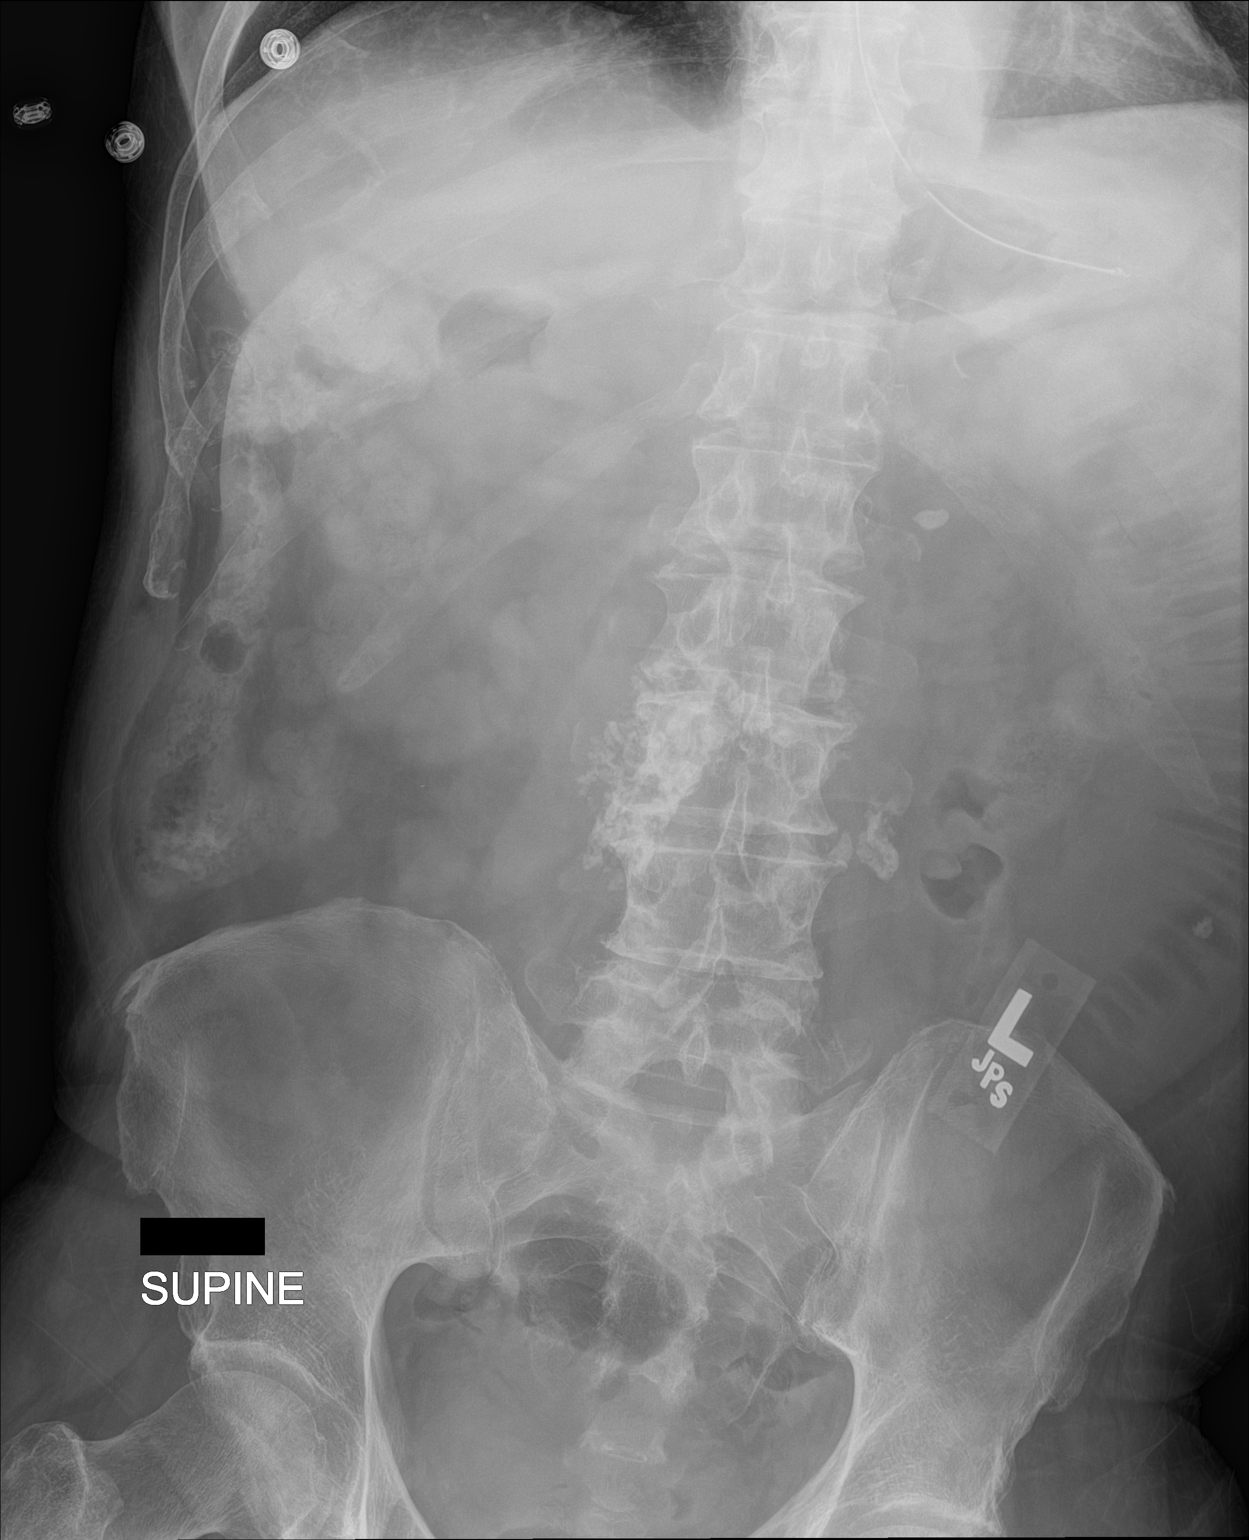

[1 of 1 positions shown; findings below may reference images not displayed]

FINDINGS: The known mesenteric calcifications seen on previous CT imaging are
stable. The side port of the NG tube is just below the GE junction
with the distal tip near the fundus. Dilated loops of small bowel
remain, particularly in the left abdomen. Many of the loops are
noted to be fluid-filled on the previous CT scan making it difficult
to completely evaluate. However, the number of dilated loops on
today's study appear less in number. No free air, portal venous gas,
or pneumatosis. No other changes.
IMPRESSION: 1. The side port of the NG tube is just below the GE junction with
the distal tip in the fundus.
2. Continued small bowel obstruction. It is difficult to compare
severity in the interval as many of the dilated loops of small bowel
were noted to be fluid-filled on the recent CT scan are not and are
not well assessed on a KUB.
3. No other changes.

## 2021-10-19 DIAGNOSIS — D649 Anemia, unspecified: Secondary | ICD-10-CM

## 2021-10-19 HISTORY — DX: Anemia, unspecified: D64.9

## 2021-11-05 DIAGNOSIS — N200 Calculus of kidney: Secondary | ICD-10-CM

## 2021-11-05 HISTORY — DX: Calculus of kidney: N20.0

## 2021-11-12 ENCOUNTER — Inpatient Hospital Stay (HOSPITAL_COMMUNITY)
Admission: EM | Admit: 2021-11-12 | Discharge: 2021-11-18 | DRG: 812 | Disposition: A | Discharge status: Home / Self Care | Payer: Medicare Other | Attending: Internal Medicine | Admitting: Internal Medicine

## 2021-11-12 ENCOUNTER — Encounter (HOSPITAL_COMMUNITY): Payer: Self-pay | Admitting: Emergency Medicine

## 2021-11-12 DIAGNOSIS — I130 Hypertensive heart and chronic kidney disease with heart failure and stage 1 through stage 4 chronic kidney disease, or unspecified chronic kidney disease: Secondary | ICD-10-CM | POA: Diagnosis present

## 2021-11-12 DIAGNOSIS — K760 Fatty (change of) liver, not elsewhere classified: Secondary | ICD-10-CM | POA: Diagnosis present

## 2021-11-12 DIAGNOSIS — Z8572 Personal history of non-Hodgkin lymphomas: Secondary | ICD-10-CM

## 2021-11-12 DIAGNOSIS — N184 Chronic kidney disease, stage 4 (severe): Secondary | ICD-10-CM | POA: Diagnosis present

## 2021-11-12 DIAGNOSIS — N132 Hydronephrosis with renal and ureteral calculous obstruction: Secondary | ICD-10-CM | POA: Diagnosis present

## 2021-11-12 DIAGNOSIS — R7989 Other specified abnormal findings of blood chemistry: Secondary | ICD-10-CM | POA: Diagnosis present

## 2021-11-12 DIAGNOSIS — Z9049 Acquired absence of other specified parts of digestive tract: Secondary | ICD-10-CM

## 2021-11-12 DIAGNOSIS — I700 Atherosclerosis of aorta: Secondary | ICD-10-CM | POA: Diagnosis present

## 2021-11-12 DIAGNOSIS — R001 Bradycardia, unspecified: Secondary | ICD-10-CM

## 2021-11-12 DIAGNOSIS — D649 Anemia, unspecified: Secondary | ICD-10-CM | POA: Diagnosis not present

## 2021-11-12 DIAGNOSIS — I472 Ventricular tachycardia, unspecified: Secondary | ICD-10-CM | POA: Diagnosis present

## 2021-11-12 DIAGNOSIS — I452 Bifascicular block: Secondary | ICD-10-CM | POA: Diagnosis present

## 2021-11-12 DIAGNOSIS — Z807 Family history of other malignant neoplasms of lymphoid, hematopoietic and related tissues: Secondary | ICD-10-CM

## 2021-11-12 DIAGNOSIS — K529 Noninfective gastroenteritis and colitis, unspecified: Secondary | ICD-10-CM | POA: Diagnosis present

## 2021-11-12 DIAGNOSIS — I441 Atrioventricular block, second degree: Secondary | ICD-10-CM | POA: Diagnosis not present

## 2021-11-12 DIAGNOSIS — G629 Polyneuropathy, unspecified: Secondary | ICD-10-CM | POA: Diagnosis present

## 2021-11-12 DIAGNOSIS — I252 Old myocardial infarction: Secondary | ICD-10-CM

## 2021-11-12 DIAGNOSIS — Z9221 Personal history of antineoplastic chemotherapy: Secondary | ICD-10-CM

## 2021-11-12 DIAGNOSIS — I5022 Chronic systolic (congestive) heart failure: Secondary | ICD-10-CM

## 2021-11-12 DIAGNOSIS — E639 Nutritional deficiency, unspecified: Secondary | ICD-10-CM | POA: Diagnosis present

## 2021-11-12 DIAGNOSIS — I2510 Atherosclerotic heart disease of native coronary artery without angina pectoris: Secondary | ICD-10-CM | POA: Diagnosis not present

## 2021-11-12 DIAGNOSIS — D6481 Anemia due to antineoplastic chemotherapy: Secondary | ICD-10-CM | POA: Diagnosis present

## 2021-11-12 DIAGNOSIS — Z881 Allergy status to other antibiotic agents status: Secondary | ICD-10-CM

## 2021-11-12 DIAGNOSIS — E785 Hyperlipidemia, unspecified: Secondary | ICD-10-CM | POA: Diagnosis present

## 2021-11-12 DIAGNOSIS — D509 Iron deficiency anemia, unspecified: Secondary | ICD-10-CM | POA: Diagnosis not present

## 2021-11-12 DIAGNOSIS — N400 Enlarged prostate without lower urinary tract symptoms: Secondary | ICD-10-CM | POA: Diagnosis present

## 2021-11-12 DIAGNOSIS — N179 Acute kidney failure, unspecified: Secondary | ICD-10-CM | POA: Diagnosis present

## 2021-11-12 DIAGNOSIS — I248 Other forms of acute ischemic heart disease: Secondary | ICD-10-CM | POA: Diagnosis present

## 2021-11-12 DIAGNOSIS — R5382 Chronic fatigue, unspecified: Secondary | ICD-10-CM | POA: Diagnosis present

## 2021-11-12 DIAGNOSIS — I5042 Chronic combined systolic (congestive) and diastolic (congestive) heart failure: Secondary | ICD-10-CM | POA: Diagnosis present

## 2021-11-12 DIAGNOSIS — J9811 Atelectasis: Secondary | ICD-10-CM | POA: Diagnosis present

## 2021-11-12 DIAGNOSIS — Z79899 Other long term (current) drug therapy: Secondary | ICD-10-CM

## 2021-11-12 DIAGNOSIS — Z8249 Family history of ischemic heart disease and other diseases of the circulatory system: Secondary | ICD-10-CM

## 2021-11-12 DIAGNOSIS — Z888 Allergy status to other drugs, medicaments and biological substances status: Secondary | ICD-10-CM

## 2021-11-12 LAB — RETICULOCYTES
Immature Retic Fract: 44.3 % — ABNORMAL HIGH (ref 2.3–15.9)
RBC.: 2.7 MIL/uL — ABNORMAL LOW (ref 4.22–5.81)
Retic Count, Absolute: 95.8 10*3/uL (ref 19.0–186.0)
Retic Ct Pct: 3.6 % — ABNORMAL HIGH (ref 0.4–3.1)

## 2021-11-12 LAB — IRON AND TIBC
Iron: 14 ug/dL — ABNORMAL LOW (ref 45–182)
Saturation Ratios: 3 % — ABNORMAL LOW (ref 17.9–39.5)
TIBC: 507 ug/dL — ABNORMAL HIGH (ref 250–450)
UIBC: 493 ug/dL

## 2021-11-12 LAB — COMPREHENSIVE METABOLIC PANEL
ALT: 27 U/L (ref 0–44)
AST: 22 U/L (ref 15–41)
Albumin: 3.3 g/dL — ABNORMAL LOW (ref 3.5–5.0)
Alkaline Phosphatase: 50 U/L (ref 38–126)
Anion gap: 8 (ref 5–15)
BUN: 34 mg/dL — ABNORMAL HIGH (ref 8–23)
CO2: 19 mmol/L — ABNORMAL LOW (ref 22–32)
Calcium: 8.8 mg/dL — ABNORMAL LOW (ref 8.9–10.3)
Chloride: 114 mmol/L — ABNORMAL HIGH (ref 98–111)
Creatinine, Ser: 2.44 mg/dL — ABNORMAL HIGH (ref 0.61–1.24)
GFR, Estimated: 27 mL/min — ABNORMAL LOW (ref >60)
Glucose, Bld: 103 mg/dL — ABNORMAL HIGH (ref 70–99)
Potassium: 5 mmol/L (ref 3.5–5.1)
Sodium: 141 mmol/L (ref 135–145)
Total Bilirubin: 0.4 mg/dL (ref 0.3–1.2)
Total Protein: 6.2 g/dL — ABNORMAL LOW (ref 6.5–8.1)

## 2021-11-12 LAB — URINALYSIS, ROUTINE W REFLEX MICROSCOPIC
Bilirubin Urine: NEGATIVE
Glucose, UA: NEGATIVE mg/dL
Hgb urine dipstick: NEGATIVE
Ketones, ur: NEGATIVE mg/dL
Leukocytes,Ua: NEGATIVE
Nitrite: NEGATIVE
Protein, ur: NEGATIVE mg/dL
Specific Gravity, Urine: 1.016 (ref 1.005–1.030)
pH: 5 (ref 5.0–8.0)

## 2021-11-12 LAB — TROPONIN I (HIGH SENSITIVITY)
Troponin I (High Sensitivity): 23 ng/L — ABNORMAL HIGH (ref <18)
Troponin I (High Sensitivity): 21 ng/L — ABNORMAL HIGH (ref <18)

## 2021-11-12 LAB — CBC
HCT: 23.7 % — ABNORMAL LOW (ref 39.0–52.0)
Hemoglobin: 6.2 g/dL — CL (ref 13.0–17.0)
MCH: 22.5 pg — ABNORMAL LOW (ref 26.0–34.0)
MCHC: 26.2 g/dL — ABNORMAL LOW (ref 30.0–36.0)
MCV: 86.2 fL (ref 80.0–100.0)
Platelets: 134 10*3/uL — ABNORMAL LOW (ref 150–400)
RBC: 2.75 MIL/uL — ABNORMAL LOW (ref 4.22–5.81)
RDW: 18.9 % — ABNORMAL HIGH (ref 11.5–15.5)
WBC: 8.3 10*3/uL (ref 4.0–10.5)
nRBC: 0 % (ref 0.0–0.2)

## 2021-11-12 LAB — FERRITIN: Ferritin: 6 ng/mL — ABNORMAL LOW (ref 24–336)

## 2021-11-12 LAB — VITAMIN B12: Vitamin B-12: 661 pg/mL (ref 180–914)

## 2021-11-12 LAB — PREPARE RBC (CROSSMATCH)

## 2021-11-12 LAB — POC OCCULT BLOOD, ED: Fecal Occult Bld: NEGATIVE

## 2021-11-12 LAB — FOLATE: Folate: 32.6 ng/mL (ref >5.9)

## 2021-11-12 MED ORDER — SODIUM CHLORIDE 0.9 % IV SOLN
10.0000 mL/h | Freq: Once | INTRAVENOUS | Status: AC
Start: 2021-11-12 — End: 2021-11-12
  Administered 2021-11-12: 10 mL/h via INTRAVENOUS

## 2021-11-12 MED ORDER — ONDANSETRON HCL 4 MG PO TABS: 4.0000 mg | ORAL_TABLET | Freq: Four times a day (QID) | ORAL | Status: DC | PRN

## 2021-11-12 MED ORDER — ACETAMINOPHEN 325 MG PO TABS
650.0000 mg | ORAL_TABLET | Freq: Four times a day (QID) | ORAL | Status: DC | PRN
  Administered 2021-11-12 – 2021-11-17 (×5): 650 mg via ORAL
  Filled 2021-11-12 (×6): qty 2

## 2021-11-12 MED ORDER — ONDANSETRON HCL 4 MG/2ML IJ SOLN: 4.0000 mg | Freq: Four times a day (QID) | INTRAMUSCULAR | Status: DC | PRN

## 2021-11-12 MED ORDER — SENNOSIDES-DOCUSATE SODIUM 8.6-50 MG PO TABS: 1.0000 | ORAL_TABLET | Freq: Every evening | ORAL | Status: DC | PRN

## 2021-11-12 MED ORDER — ACETAMINOPHEN 650 MG RE SUPP: 650.0000 mg | Freq: Four times a day (QID) | RECTAL | Status: DC | PRN

## 2021-11-12 MED ORDER — GABAPENTIN 600 MG PO TABS
600.0000 mg | ORAL_TABLET | Freq: Two times a day (BID) | ORAL | Status: DC
  Administered 2021-11-12 – 2021-11-13 (×2): 600 mg via ORAL
  Filled 2021-11-12 (×3): qty 1

## 2021-11-12 MED ORDER — ADULT MULTIVITAMIN W/MINERALS CH
1.0000 | ORAL_TABLET | Freq: Every day | ORAL | Status: DC
  Administered 2021-11-13 – 2021-11-18 (×5): 1 via ORAL
  Filled 2021-11-12 (×6): qty 1

## 2021-11-12 NOTE — Consult Note (Signed)
Cardiology Consultation   Patient ID: Jack Fuentes MRN: [id number]; DOB: April 02, 1945  Admit date: 11/12/2021 Date of Consult: 11/12/2021  PCP:  Med[facility], [place] Providers Cardiologist:  None       Patient Profile:   Jack Fuentes is a 76 y.o. male with a hx of non-Hodgkins lymphoma s/p chemo and XRT, HLD, peripheral neuropathy and normocytic anemia who is being seen 11/12/2021 for the evaluation of intermittent type II heart block at the request of Dorise Bullion, PA.  History of Present Illness:   Jack Fuentes is a 77 year old male with history of possible prior inferior infarct per outpatient stress test on plavix but no history of prior cath/PCI/angioplasty and HF with mildly reduced EF on TTE in 2020 who presented to the ER after being profoundly anemic with HgB 5.6 with worsening fatigue and SOB. Was found to have Mobitz type II AVB on telemetry for which Cardiology was consulted.  Patient states that he was remotely seen by [facility] clinic for a cardiac work-up. He reports he underwent an exercise nuclear stress test where he was told he had an "inferior scar" with evidence of a "prior heart attack." He was placed on plavix at that time. He denies any history of prior cath or PCI. TTE in 2020 showed LVEF 45-50% with global hypokinesis (although limited visualization), normal RV, mild AR, GLS-11.9%.   He states he was overall doing well until about 2 weeks ago when he developed progressive fatigue and SOB. He was found to have hemoglobin of 5.2 on outpatient labs prompting referral to ER.   In ER, labs notable for HgB 6.2 with normal MCV, ferritin 6, trop 23>21, Cr 2.44 from 0.91 in 2020. Was noted to have episodes of 2:1 AVB and runs of mobitz type II AVB on telemetry for which Cardiology was called.   Currently, the patient states he feels tired and has dyspnea on exertion. He denies any history of syncope or presyncope. No  chest pain, orthopnea or PND. Has trace LE edema on exam. Has not been feeling SOB until he was notified about his hemoglobin. He is not on any nodal agents.    Past Medical History:  Diagnosis Date   Cancer ([place])    lymphoma-large B-cell   History of left bundle branch block (LBBB)    Hyperlipidemia    Peripheral neuropathy    SBO (small bowel obstruction) ([place]) 06/2018    Past Surgical History:  Procedure Laterality Date   EYE SURGERY     IR PATIENT EVAL TECH 0-60 MINS  08/01/2018   LAPAROTOMY N/A 07/03/2018   Procedure: EXPLORATORY LAPAROTOMY, LYSIS OF ADHESION, BOWEL RESECTION, RESECTION MESTASTATIC TUMOR;  Surgeon: Jovita Kussmaul, MD;  Location: [facility];  Service: General;  Laterality: N/A;   NASAL FRACTURE SURGERY       Home Medications:  Prior to Admission medications   Medication Sig Start Date End Date Taking? Authorizing Provider  acetaminophen (TYLENOL) 325 MG tablet Take 2 tablets (650 mg total) by mouth every 6 (six) hours. Patient taking differently: Take 650 mg by mouth every 6 (six) hours as needed for moderate pain. 07/13/18  Yes Isabelle Course, MD  gabapentin (NEURONTIN) 600 MG tablet Take 600 mg by mouth 2 (two) times daily.  04/18/18  Yes [provider]  Multiple Vitamin (MULTIVITAMIN WITH MINERALS) TABS tablet Take 1 tablet by mouth daily. 07/14/18  Yes Isabelle Course, MD  Turmeric 500  MG CAPS Take 500 mg by mouth 2 (two) times daily.   Yes [provider]  valsartan (DIOVAN) 40 MG tablet Take 40 mg by mouth daily as needed (For blood pressure). 10/15/21  Yes [provider]  clopidogrel (PLAVIX) 75 MG tablet Take 75 mg by mouth daily. Patient not taking: Reported on 11/12/2021 04/18/18   [provider]  methocarbamol (ROBAXIN) 500 MG tablet Take 1 tablet (500 mg total) by mouth every 8 (eight) hours as needed for muscle spasms. Patient not taking: Reported on 11/12/2021 07/13/18   Isabelle Course, MD  rosuvastatin (CRESTOR) 10 MG tablet  Take 10 mg by mouth daily. Patient not taking: Reported on 11/12/2021 04/18/18   [provider]  saccharomyces boulardii (DAILY PROBIOTIC SUPPLEMENT) 250 MG capsule Take 250 mg by mouth 2 (two) times daily. FOR SUPPLEMENT(DO NOT CRUSH) Patient not taking: Reported on 11/12/2021    [provider]    Inpatient Medications: Scheduled Meds:  Continuous Infusions:  sodium chloride     PRN Meds:   Allergies:    Allergies  Allergen Reactions   Levofloxacin Swelling   Midazolam Swelling    Social History:   Social History   Socioeconomic History   Marital status: Divorced    Spouse name: Not on file   Number of children: Not on file   Years of education: Not on file   Highest education level: Not on file  Occupational History   Not on file  Tobacco Use   Smoking status: Never   Smokeless tobacco: Never  Vaping Use   Vaping Use: Never used  Substance and Sexual Activity   Alcohol use: Not Currently   Drug use: Never   Sexual activity: Not on file  Other Topics Concern   Not on file  Social History Narrative   Not on file   Social Determinants of Health   Financial Resource Strain: Not on file  Food Insecurity: Not on file  Transportation Needs: Not on file  Physical Activity: Not on file  Stress: Not on file  Social Connections: Not on file  Intimate Partner Violence: Not on file    Family History:   Family History  Problem Relation Age of Onset   Heart disease Father    Multiple myeloma Sister      ROS:  Please see the history of present illness.   All other ROS reviewed and negative.     Physical Exam/Data:   Vitals:   11/12/21 1630 11/12/21 1645 11/12/21 1700 11/12/21 1715  BP: 121/74 123/76 120/72 127/69  Pulse: 79 73 71 78  Resp: (!) 22 (!) 26 (!) 23 18  Temp:      TempSrc:      SpO2: 98% 99% 100% 100%   No intake or output data in the 24 hours ending 11/12/21 1740    08/08/2018    2:23 PM 08/06/2018   11:33 AM 07/18/2018    10:30 AM  Last 3 Weights  Weight (lbs) 145 lb 14.4 oz 145 lb 148 lb 3.2 oz  Weight (kg) 66.18 kg 65.772 kg 67.223 kg     There is no height or weight on file to calculate BMI.  General:  Well nourished, well developed, in no acute distress HEENT: normal Neck: no JVD Vascular: No carotid bruits; Distal pulses 2+ bilaterally Cardiac:  Bradycardic, irregular, no murmurs Lungs:  clear to auscultation bilaterally, no wheezing, rhonchi or rales  Abd: soft, nontender, no hepatomegaly  Ext: Trace edema, luke  warm Musculoskeletal:  No deformities, BUE and BLE strength normal and equal Skin: Pale Neuro:  CNs 2-12 intact, no focal abnormalities noted Psych:  Normal affect   EKG:  The EKG was personally reviewed and demonstrates:  Mobitz type II AVB, LAFB, iRBBB Telemetry:  Telemetry was personally reviewed and demonstrates:  Episodes of 2:1 AVB, runs of mobitz type II AVB and NSR  Relevant CV Studies: TTE 07/2018: IMPRESSIONS     1. The left ventricle has mildly reduced systolic function, with an  ejection fraction of 45-50%. The cavity size was normal. Left ventricular  diastolic Doppler parameters are consistent with pseudonormalization.  Elevated mean left atrial pressure.   2. Endocardial border defnintion is poor. There appears to be a mild  reduction in global systolic function without focal wall motion  abnormalities.   3. The right ventricle has normal systolic function. The cavity was  normal. There is no increase in right ventricular wall thickness. Right  ventricular systolic pressure could not be assessed.   4. No evidence of mitral valve stenosis.   5. The aortic valve is tricuspid. Mild thickening of the aortic valve.  Mild calcification of the aortic valve. Aortic valve regurgitation is mild  to moderate by color flow Doppler. No stenosis of the aortic valve.   6. The ascending aorta is normal in size and structure.   7. The average left ventricular global longitudinal  strain is -11.9 %.  Laboratory Data:  High Sensitivity Troponin:   Recent Labs  Lab 11/12/21 1259  TROPONINIHS 23*     Chemistry Recent Labs  Lab 11/12/21 1259  NA 141  K 5.0  CL 114*  CO2 19*  GLUCOSE 103*  BUN 34*  CREATININE 2.44*  CALCIUM 8.8*  GFRNONAA 27*  ANIONGAP 8    Recent Labs  Lab 11/12/21 1259  PROT 6.2*  ALBUMIN 3.3*  AST 22  ALT 27  ALKPHOS 50  BILITOT 0.4   Lipids No results for input(s): "CHOL", "TRIG", "HDL", "LABVLDL", "LDLCALC", "CHOLHDL" in the last 168 hours.  Hematology Recent Labs  Lab 11/12/21 1259 11/12/21 1623  WBC 8.3  --   RBC 2.75* 2.70*  HGB 6.2*  --   HCT 23.7*  --   MCV 86.2  --   MCH 22.5*  --   MCHC 26.2*  --   RDW 18.9*  --   PLT 134*  --    Thyroid No results for input(s): "TSH", "FREET4" in the last 168 hours.  BNPNo results for input(s): "BNP", "PROBNP" in the last 168 hours.  DDimer No results for input(s): "DDIMER" in the last 168 hours.   Radiology/Studies:  No results found.   Assessment and Plan:   #Type II AVB: Noted on telemetry and ECG on arrival. Occurred in the setting of profound anemia and AKI although does have RBBB and LAFB and is at risk of having high grade conduction disease. He is not on any nodal agents. Denies any history of syncope or presyncope. Has had SOB and fatigue over the past 2 weeks but this is in the setting of profound anemia. Would recommend treating anemia, AKI and continuing to monitor on telemetry. If has persistent AVB after clinical improvement, will have EP evaluate for consideration for PPM. -Manage anemia and AKI per primary team -If has persistent high degree AVB despite above, will have EP evaluate for consideration for possible PPM -If conduction improves, will need monitor on discharge -Continue telemetry -Not on nodal agents -Check TTE -Keep  K>4, Mg>2  #?History of CAD: Patient reports history of abnormal stress test with "inferior scar" for which he was  started on plavix. Denies history of cath. Denies anginal symptoms and states his dyspnea on exertion began after anemia. Will check TTE and hold plavix for now. Can likely plan for further work-up as outpatient. -Hold plavix -Check TTE as above  #Chronic Systolic HF: EF 34-14% on TTE in 2020 with history of abnormal stress test in the past but no history of cath. Does not appear overloaded on exam. Cannot add GDMT due to AKI and AVB. Will repeat TTE for monitoring. -Check TTE -No BB due to AVB -No ACE/ARB/ARNI/spiro due to AKI -Will add GDMT as able -Monitor I/Os and daily weights  #Normocytic Anemia: HgB 6 on admission with SOB and fatigue. Denies any bleeding but has been on plavix.  -Holding plavix -Work-up per primary team  #AKI: Cr 0.9 in 2020 now 2.44. Unknown recent baseline. Likely AKI driven by worsening anemia.  -Management per primary     Risk Assessment/Risk Scores:       New York Heart Association (NYHA) Functional Class NYHA Class II        For questions or updates, please contact [place] Please consult [email or web address] for contact info under    Signed, Freada Bergeron, MD  11/12/2021 5:40 PM

## 2021-11-12 NOTE — ED Provider Triage Note (Signed)
Emergency Medicine Provider Triage Evaluation Note  Jack Fuentes , a 76 y.o. male  was evaluated in triage.  Pt complains of anemia.  Patient reports routine blood work performed approximately 1 month ago showed anemia but he was unaware of it until approximately 1 week ago.  Patient states that he felt her baseline up until he felt increasingly short of breath as well as fatigue.  He states he then looked at his blood work on his MyChart and noted to hemoglobin of 5.2.  Denies any known bleeding source including hematochezia, melena, hematemesis or known wounds.  He does states he has been on Plavix since 2008.  Has a history of non-Hodgkin's lymphoma.  Denies fever, chills, night sweats, chest pain, abdominal pain, nausea, vomiting, urinary symptoms, change in bowel habits.  Review of Systems  Positive: See above Negative:   Physical Exam  BP 102/69 (BP Location: Right Arm)   Pulse (!) 41   Temp 98.2 F (36.8 C) (Oral)   Resp 20   SpO2 93%  Gen:   Awake, no distress   Resp:  Normal effort  MSK:   Moves extremities without difficulty  Other:  Lungs clear to auscultation.  No lower extremity edema noted.  Irregular rhythm.  Medical Decision Making  Medically screening exam initiated at 1:09 PM.  Appropriate orders placed.  CALDWELL KRONENBERGER was informed that the remainder of the evaluation will be completed by another provider, this initial triage assessment does not replace that evaluation, and the importance of remaining in the ED until their evaluation is complete.     Wilnette Kales, [place] 11/12/21 1311

## 2021-11-12 NOTE — Hospital Course (Addendum)
Jack Fuentes is a 75 y.o. 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  Repeat TTE on admission showing improvement in EF to 55 to 07%, grade 2 diastolic dysfunction.  With diuresing every other day, patient has been improving.  Patient is no longer requiring oxygen.  Patient improved during hospitalization with diuresis.  Plan would be to discharge the patient on Lasix for symptomatic diuresis on a as needed basis.  Patient will also need evaluation with heme oncology for pleural effusions to ensure there is no active malignancy.   #Iron deficiency anemia  Patient initially admitted for symptomatic anemia, which improved during admission.  Patient had very low ferritin.  This could be multifactorial given patient had a small bowel resection back in 2020-2021 and also takes turmeric daily.  During admission, patient got 4 infusions of iron and transition to oral 325 mg ferrous sulfate.  Patient to follow-up outpatient to monitor hemoglobin.  Patient also follow-up with gastroenterology outpatient for colonoscopy given no history of colonoscopy and iron deficiency anemia.  Continue outpatient ferrous sulfate.   #Suspected AKI, unclear baseline, resolving #Right kidney hydronephrosis 2/2 urolithiasis , status post placement of right nephrostomy tube CT abdomen pelvis on 11/13/2021 showing moderate/severe right-sided hydronephrosis, right urolithiasis, and severe  left renal atrophy. Patient received right nephrostomy tube 9/12 and kidney function has improved with creatinine trending down during hospitalization.  Unclear baseline although normal Cr in 2020.  Plan for urologic follow-up for stones.   #Second-degree AV block (Mobitz type I) EKG on admission showing second-degree AV block Mobitz type II.  Initially resolved, but then returned back overnight on 11/17/2021.  Electrophysiology did follow the patient inpatient who recommended the patient receive a Zio patch on discharge and follow-up outpatient.  Patient had no concerns of dizziness or lightheadedness during hospitalization.    #Abnormal Thyroid functions tests During the hospitalization patient had mild abnormalities in his thyroid function tests with TSH at 5 and T4 at 1.35. Plan to follow up outpatient.   History of CAD? Patient reports a history of abnormal stress test years ago and calcifications found on CT scan prompting his cardiologist to start him on Plavix.  Given concern initially for acute bleed, his Plavix was held.  Patient to be discharged without Plavix.  Given age, and absence of diabetes history, consideration for statin was not indicated.  Patient to follow-up with cardiology to see if aspirin or Plavix is appropriate for patient outpatient.   #Peripheral neuropathy There was no concern about this during the hospitalization. Patient initially was on 600 mg Neurontin at home but this was reduced to 300 mg twice daily because of acute kidney injury. Patient to be discharge on 300 mg Neurontin twice daily.

## 2021-11-12 NOTE — ED Triage Notes (Signed)
Patient sent to ED for anemia, Hgb reported at 5.2, denies known source of bleeding, was on plavix but stopped taking it approximately one week ago. Patient denies pain, is alert, oriented, and in no apparent distress at this time.

## 2021-11-12 NOTE — ED Notes (Signed)
ED TO INPATIENT HANDOFF REPORT  ED Nurse Name and Phone #: [phone] [place], RN  S Name/Age/Gender Jack Fuentes 76 y.o. male Room/Bed: 008C/008C  Code Status   Code Status: Full Code  Home/SNF/Other Home Patient oriented to: self, place, time, and situation Is this baseline? Yes   Triage Complete: Triage complete  Chief Complaint Symptomatic anemia [D64.9]  Triage Note Patient sent to ED for anemia, Hgb reported at 5.2, denies known source of bleeding, was on plavix but stopped taking it approximately one week ago. Patient denies pain, is alert, oriented, and in no apparent distress at this time.   Allergies Allergies  Allergen Reactions   Levofloxacin Swelling   Midazolam Swelling    Level of Care/Admitting Diagnosis ED Disposition     ED Disposition  Admit   Condition  --   Comment  Hospital Area: [facility] [[id number]]  Level of Care: Telemetry Cardiac [[id number]]  May place patient in observation at [facility] or [place] if equivalent level of care is available:: No  Covid Evaluation: Asymptomatic - no recent exposure (last 10 days) testing not required  Diagnosis: Symptomatic anemia [1219758]  Admitting Physician: Sid Falcon [phone]  Attending Physician: Cresenciano Lick          B Medical/Surgery History Past Medical History:  Diagnosis Date   Cancer ([place])    lymphoma-large B-cell   History of left bundle branch block (LBBB)    Hyperlipidemia    Peripheral neuropathy    SBO (small bowel obstruction) ([place]) 06/2018   Past Surgical History:  Procedure Laterality Date   EYE SURGERY     IR PATIENT EVAL TECH 0-60 MINS  08/01/2018   LAPAROTOMY N/A 07/03/2018   Procedure: EXPLORATORY LAPAROTOMY, LYSIS OF ADHESION, BOWEL RESECTION, RESECTION MESTASTATIC TUMOR;  Surgeon: Jovita Kussmaul, MD;  Location: [facility];  Service: General;  Laterality: N/A;   NASAL FRACTURE SURGERY       A IV Location/Drains/Wounds Patient  Lines/Drains/Airways Status     Active Line/Drains/Airways     Name Placement date Placement time Site Days   Peripheral IV 11/12/21 20 G Anterior;Distal;Right;Upper Arm 11/12/21  1625  Arm  less than 1   Closed System Drain 1 Left;Lateral Abdomen Bulb (JP) 19 Fr. 07/03/18  1650  Abdomen  1228   Incision (Closed) 07/03/18 Abdomen Other (Comment) 07/03/18  1715  -- 1228   Pressure Injury 07/10/18 Stage II -  Partial thickness loss of dermis presenting as a shallow open ulcer with a red, pink wound bed without slough. 07/10/18  1236  -- 1221            Intake/Output Last 24 hours No intake or output data in the 24 hours ending 11/12/21 1943  Labs/Imaging Results for orders placed or performed during the hospital encounter of 11/12/21 (from the past 48 hour(s))  Type and screen [name]     Status: None (Preliminary result)   Collection Time: 11/12/21 12:58 PM  Result Value Ref Range   ABO/RH(D) A POS    Antibody Screen NEG    Sample Expiration 11/15/2021,2359    Unit Number [id number]    Blood Component Type RED CELLS,LR    Unit division 00    Status of Unit ALLOCATED    Transfusion Status OK TO TRANSFUSE    Crossmatch Result Compatible    Unit Number [id number]    Blood Component Type RED CELLS,LR    Unit division 00  Status of Unit ISSUED    Transfusion Status OK TO TRANSFUSE    Crossmatch Result      Compatible Performed at [facility], [place] [address], [place], [place] [address]   Comprehensive metabolic panel     Status: Abnormal   Collection Time: 11/12/21 12:59 PM  Result Value Ref Range   Sodium 141 135 - 145 mmol/L   Potassium 5.0 3.5 - 5.1 mmol/L   Chloride 114 (H) 98 - 111 mmol/L   CO2 19 (L) 22 - 32 mmol/L   Glucose, Bld 103 (H) 70 - 99 mg/dL    Comment: Glucose reference range applies only to samples taken after fasting for at least 8 hours.   BUN 34 (H) 8 - 23 mg/dL   Creatinine, Ser 2.44 (H) 0.61 - 1.24 mg/dL    Calcium 8.8 (L) 8.9 - 10.3 mg/dL   Total Protein 6.2 (L) 6.5 - 8.1 g/dL   Albumin 3.3 (L) 3.5 - 5.0 g/dL   AST 22 15 - 41 U/L   ALT 27 0 - 44 U/L   Alkaline Phosphatase 50 38 - 126 U/L   Total Bilirubin 0.4 0.3 - 1.2 mg/dL   GFR, Estimated 27 (L) >60 mL/min    Comment: (NOTE) Calculated using the CKD-EPI Creatinine Equation (2021)    Anion gap 8 5 - 15    Comment: Performed at [facility], [place] [address] [place], [place] [address]  CBC     Status: Abnormal   Collection Time: 11/12/21 12:59 PM  Result Value Ref Range   WBC 8.3 4.0 - 10.5 K/uL   RBC 2.75 (L) 4.22 - 5.81 MIL/uL   Hemoglobin 6.2 (LL) 13.0 - 17.0 g/dL    Comment: REPEATED TO VERIFY THIS CRITICAL RESULT HAS VERIFIED AND BEEN CALLED TO CLARK ROWE RN BY MELISSA BOOSTEDT ON 09 08 2023 AT 1322, AND HAS BEEN READ BACK.     HCT 23.7 (L) 39.0 - 52.0 %   MCV 86.2 80.0 - 100.0 fL   MCH 22.5 (L) 26.0 - 34.0 pg   MCHC 26.2 (L) 30.0 - 36.0 g/dL   RDW 18.9 (H) 11.5 - 15.5 %   Platelets 134 (L) 150 - 400 K/uL    Comment: SPECIMEN CHECKED FOR CLOTS PLATELET COUNT CONFIRMED BY SMEAR    nRBC 0.0 0.0 - 0.2 %    Comment: Performed at [facility] [address] [place], [place] [address]  Troponin I (High Sensitivity)     Status: Abnormal   Collection Time: 11/12/21 12:59 PM  Result Value Ref Range   Troponin I (High Sensitivity) 23 (H) <18 ng/L    Comment: (NOTE) Elevated high sensitivity troponin I (hsTnI) values and significant  changes across serial measurements may suggest ACS but many other  chronic and acute conditions are known to elevate hsTnI results.  Refer to the "Links" section for chest pain algorithms and additional  guidance. Performed at [facility], [place] [address] [place], [place] [address]   Prepare RBC (crossmatch)     Status: None   Collection Time: 11/12/21  4:07 PM  Result Value Ref Range   Order Confirmation      ORDER PROCESSED BY BLOOD BANK Performed at [facility], [place] [address] [place], [place] [address]   Troponin I (High Sensitivity)     Status: Abnormal   Collection Time: 11/12/21  4:23 PM  Result Value Ref Range   Troponin I (  High Sensitivity) 21 (H) <18 ng/L    Comment: (NOTE) Elevated high sensitivity troponin I (hsTnI) values and significant  changes across serial measurements may suggest ACS but many other  chronic and acute conditions are known to elevate hsTnI results.  Refer to the "Links" section for chest pain algorithms and additional  guidance. Performed at [facility], [place] [address] [place], [place] [address]   Vitamin B12     Status: None   Collection Time: 11/12/21  4:23 PM  Result Value Ref Range   Vitamin B-12 661 180 - 914 pg/mL    Comment: (NOTE) This assay is not validated for testing neonatal or myeloproliferative syndrome specimens for Vitamin B12 levels. Performed at [facility], [place] [address] [place], [place] [address]   Iron and TIBC     Status: Abnormal   Collection Time: 11/12/21  4:23 PM  Result Value Ref Range   Iron 14 (L) 45 - 182 ug/dL   TIBC 507 (H) 250 - 450 ug/dL   Saturation Ratios 3 (L) 17.9 - 39.5 %   UIBC 493 ug/dL    Comment: Performed at [facility], [place] [address], [place], [place] [address]  Ferritin     Status: Abnormal   Collection Time: 11/12/21  4:23 PM  Result Value Ref Range   Ferritin 6 (L) 24 - 336 ng/mL    Comment: Performed at [facility], [place] [address] [place], [place] [address]  Reticulocytes     Status: Abnormal   Collection Time: 11/12/21  4:23 PM  Result Value Ref Range   Retic Ct Pct 3.6 (H) 0.4 - 3.1 %   RBC. 2.70 (L) 4.22 - 5.81 MIL/uL   Retic Count, Absolute 95.8 19.0 - 186.0 K/uL   Immature Retic Fract 44.3 (H) 2.3 - 15.9 %    Comment: Performed at [facility] [address] [place], [place] [address]  POC occult blood, ED     Status: None   Collection Time: 11/12/21  4:33 PM  Result Value Ref Range   Fecal Occult Bld  NEGATIVE NEGATIVE   No results found.  Pending Labs Unresulted Labs (From admission, onward)     Start     Ordered   11/12/21 1645  Urinalysis, Routine w reflex microscopic  Once,   URGENT        11/12/21 1644   11/12/21 1607  Folate  (Anemia Panel (PNL))  Once,   URGENT        11/12/21 1606            Vitals/Pain Today's Vitals   11/12/21 1845 11/12/21 1900 11/12/21 1915 11/12/21 1930  BP: 117/73 118/72 118/72 124/77  Pulse: 70 71 72 71  Resp: (!) 21 20 (!) 24 (!) 22  Temp:      TempSrc:      SpO2: 100% 99% 99% 98%  PainSc:        Isolation Precautions No active isolations  Medications Medications  acetaminophen (TYLENOL) tablet 650 mg (has no administration in time range)    Or  acetaminophen (TYLENOL) suppository 650 mg (has no administration in time range)  senna-docusate (Senokot-S) tablet 1 tablet (has no administration in time range)  ondansetron (ZOFRAN) tablet 4 mg (has no administration in time range)    Or  ondansetron (ZOFRAN) injection 4 mg (has no administration in time range)  gabapentin (NEURONTIN) tablet 600 mg (has no administration in time range)  multivitamin  with minerals tablet 1 tablet (has no administration in time range)  0.9 %  sodium chloride infusion (10 mL/hr Intravenous New Bag/Given 11/12/21 1810)    Mobility walks with device Low fall risk          Neuro Assessment: Within Defined Limits    R Recommendations: See Admitting Provider Note    Additional Notes: Receiving blood at 175cc/hr currently, unit 1 of 2

## 2021-11-12 NOTE — ED Provider Notes (Signed)
Jack Fuentes Provider Note   CSN: [id number] Arrival date & time: 11/12/21  1220     History  Chief Complaint  Patient presents with   Anemia    Jack Fuentes is a 76 y.o. male with Hx of prior SBO, hyperlipidemia, chronic fatigue, peripheral neuropathy due to chemotherapy, normocytic normochromic anemia, and prior malignancy (non-Hodgkin's lymphoma).  Presenting today due to abnormal lab values, specifically in Hgb reported of 5.2.  Also with worsening fatigue and shortness of breath over the last 2 days.  Recommended by PCP come to the ED for likely transfusion.  No prior history of transfusions.  Denies hemoptysis, bloody bowel movements, or hematuria.  No recent wounds, fevers, or infections.  No other physical or symptomatic complaints at this time.  Was taking Plavix but stopped doing so 1 week ago.  The history is provided by the patient and medical records.  Anemia     Home Medications Prior to Admission medications   Medication Sig Start Date End Date Taking? Authorizing Provider  acetaminophen (TYLENOL) 325 MG tablet Take 2 tablets (650 mg total) by mouth every 6 (six) hours. 07/13/18   Isabelle Course, MD  clopidogrel (PLAVIX) 75 MG tablet Take 75 mg by mouth daily. 04/18/18   [provider]  Ensure (ENSURE) Take 237 mLs by mouth 3 (three) times daily between meals.    [provider]  gabapentin (NEURONTIN) 600 MG tablet Take 600 mg by mouth 2 (two) times daily.  04/18/18   [provider]  methocarbamol (ROBAXIN) 500 MG tablet Take 1 tablet (500 mg total) by mouth every 8 (eight) hours as needed for muscle spasms. 07/13/18   Isabelle Course, MD  Multiple Vitamin (MULTIVITAMIN WITH MINERALS) TABS tablet Take 1 tablet by mouth daily. 07/14/18   Isabelle Course, MD  rosuvastatin (CRESTOR) 10 MG tablet Take 10 mg by mouth daily. 04/18/18   [provider]  saccharomyces boulardii (DAILY PROBIOTIC SUPPLEMENT)  250 MG capsule Take 250 mg by mouth 2 (two) times daily. FOR SUPPLEMENT(DO NOT CRUSH)    [provider]  Turmeric 500 MG CAPS Take 500 mg by mouth 2 (two) times daily.    [provider]      Allergies    Levofloxacin and Midazolam    Review of Systems   Review of Systems  Constitutional:        Abnormal lab value  All other systems reviewed and are negative.   Physical Exam Updated Vital Signs BP (!) 104/36   Pulse (!) 104   Temp 98 F (36.7 C) (Oral)   Resp (!) 27   SpO2 97%  Physical Exam Vitals and nursing note reviewed.  Constitutional:      General: He is awake. He is not in acute distress.    Appearance: He is well-developed.     Comments: Appears clinically fatigued  HENT:     Head: Normocephalic and atraumatic.     Comments: Pale mucous membranes    Mouth/Throat:     Comments: No petechia appreciated on tongue.  No gingival bleeding. Eyes:     Conjunctiva/sclera: Conjunctivae normal.  Neck:     Comments: No meningismus or torticollis Cardiovascular:     Rate and Rhythm: Normal rate and regular rhythm.     Heart sounds: No murmur heard. Pulmonary:     Effort: Pulmonary effort is normal. No respiratory distress.     Breath sounds: Normal breath sounds.  Comments: Able to speak phrases, then becomes breathless.  Lungs CTAB. Abdominal:     Palpations: Abdomen is soft.     Tenderness: There is no abdominal tenderness.  Genitourinary:    Rectum: Normal. No tenderness or anal fissure. External hemorrhoid: Without hemorrhage.Normal anal tone.     Comments: No overt blood, black tarry stool, or reddish brown stool appreciated on exam.  No active hemorrhage. Musculoskeletal:        General: No swelling.     Cervical back: Neck supple.  Skin:    General: Skin is warm and dry.     Capillary Refill: Capillary refill takes less than 2 seconds.     Coloration: Skin is pale. Skin is not cyanotic or mottled.  Neurological:     Mental Status: He  is alert and oriented to person, place, and time.  Psychiatric:        Mood and Affect: Mood normal.        Behavior: Behavior is cooperative.     ED Results / Procedures / Treatments   Labs (all labs ordered are listed, but only abnormal results are displayed) Labs Reviewed  COMPREHENSIVE METABOLIC PANEL - Abnormal; Notable for the following components:      Result Value   Chloride 114 (*)    CO2 19 (*)    Glucose, Bld 103 (*)    BUN 34 (*)    Creatinine, Ser 2.44 (*)    Calcium 8.8 (*)    Total Protein 6.2 (*)    Albumin 3.3 (*)    GFR, Estimated 27 (*)    All other components within normal limits  CBC - Abnormal; Notable for the following components:   RBC 2.75 (*)    Hemoglobin 6.2 (*)    HCT 23.7 (*)    MCH 22.5 (*)    MCHC 26.2 (*)    RDW 18.9 (*)    Platelets 134 (*)    All other components within normal limits  TROPONIN I (HIGH SENSITIVITY) - Abnormal; Notable for the following components:   Troponin I (High Sensitivity) 23 (*)    All other components within normal limits  VITAMIN B12  FOLATE  IRON AND TIBC  FERRITIN  RETICULOCYTES  TYPE AND SCREEN  PREPARE RBC (CROSSMATCH)  TROPONIN I (HIGH SENSITIVITY)    EKG EKG Interpretation  Date/Time:  Friday November 12 2021 12:43:08 EDT Ventricular Rate:  58 PR Interval:  174 QRS Duration: 114 QT Interval:  458 QTC Calculation: 449 R Axis:   -50 Text Interpretation: with 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  Procedures Procedures    Medications Ordered in ED Medications  0.9 %  sodium chloride infusion (has no administration in time range)    ED Course/ Medical Decision Making/ A&P                           Medical Decision Making Amount and/or  Complexity of Data Reviewed Labs: ordered.  Risk Prescription drug management. Decision regarding hospitalization.   75 y.o. male presents to the ED for concern of Anemia     This involves an extensive number of treatment options, and is a complaint that carries with it a high risk of complications and morbidity.  Past Medical History / Co-morbidities / Social History: Hx of prior SBO, hyperlipidemia, chronic fatigue, peripheral neuropathy due to chemotherapy, normocytic normochromic anemia, and prior malignancy (non-Hodgkin's lymphoma) Social Determinants of Health include: Elderly  Additional History:  Obtained by chart review.  Notably heme-onc notes from January 2022 detailing treatment course, see separately  Lab Tests: I ordered, and personally interpreted labs.  The pertinent results include:   Troponin 23 Creatinine 2.44, GFR 27, BUN 34 H&H 6.2/23.7, no recent lab values to compare  Imaging Studies: None  Cardiac Monitoring: The patient was maintained on a cardiac monitor.  I personally viewed and interpreted the cardiac monitored which showed an underlying rhythm of: AV block type 2  ED Course / Critical Interventions: Pt mildly ill-appearing on exam.  Presenting today due to abnormal hemoglobin values, recommended by his primary care provider from an annual wellness check to come to the ED for possible transfusion.  Hemoglobin of 5.2.  Over the last 2 to 3 days with worsening fatigue and shortness of breath.  No Hx of prior transfusions.  Notable Hx of non-Hodgkin's lymphoma diagnosed in early 2000, treated with chemo and radiation went into remission.  Resurfaced with enlarged abdominal lymph nodes that caused an SBO in 2020.  Received surgical intervention, shortening of the bowel, and no chemo or radiation provided at that time.  Stopped taking Plavix one week ago. Hgb of 6.2 today.  No obvious petechiae or gingival bleeding appreciated on exam.  Initial attempt at  hemoccult, second attempt from bowel movement pending.  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  Known Hx of BBB per cardiology assessment several years ago, however again AV block is new.  Patient without chest pain.  Believe the patient would best be served by a blood transfusion.  Discussed risks and benefits with the patient, who consented.  Also noted evidence of possible AKI, no recent labs to compare.  Without urinary symptoms, flank pain, or abdominal pain.  UA and hemoccult pending.  Plan to admit for symptomatic anemia. Cardiology consult pending.  Plan to admit to hopsitalist Consulted with Dr. Elberta Spaniel of cardiology.  Agrees with plan for admission, with emphasis on rehydration and overall fluid volume replenishment.  Aware of new AV block.  Plans to follow. Consulted with Dr. Betha Loa of internal medicine team.  Discussed patient at length.  Agrees with plan for admission.  Disposition: Admission  I discussed this case with my attending, Dr. Kathrynn Humble, who agreed with the proposed treatment course and cosigned this note including patient's presenting symptoms, physical exam, and planned diagnostics and interventions.  Attending physician stated agreement with plan or made changes to plan which were implemented.     This chart was dictated using voice recognition software.  Despite best efforts to proofread, errors can occur which can change the documentation meaning.         Final Clinical Impression(s) / ED Diagnoses Final diagnoses:  Symptomatic anemia    Rx / DC Orders ED Discharge Orders     None         Prince Rome, PA-C 07/62/26 1647    Varney Biles, MD 11/13/21 [phone]

## 2021-11-12 NOTE — H&P (Signed)
Date: 11/12/2021               Patient Name:  Jack Fuentes MRN: [id number]  DOB: 1945/07/29 Age / Sex: 76 y.o., male   PCP: [facility] [place] Service: Internal Medicine Teaching Service         Attending Physician: Dr. Sid Falcon, MD    First Contact: Leigh Aurora, DO      Pager: [phone]      Second Contact: Linwood Dibbles, MD     Pager: PA [phone]        After Hours (After 5p/  First Contact Pager: [phone]  weekends / holidays): Second Contact Pager: [phone]   SUBJECTIVE   Chief Complaint: Fatigue and shortness of breath  History of Present Illness: This is a 76 year old male with a past medical history of small bowel obstruction secondary to prior malignancy (non-Hodgkin's lymphoma), fatigue from chemotherapy, hyperlipidemia, neuropathy presenting to the emergency room with concerns of anemia.  Patient reports that he had an appointment with his primary care physician (Dr. Carolynne Edouard) [facility] [place] on October 19, 2021 in which blood work was done.  This was his annual wellness check.  He checked his portal 8 days ago, and saw results showing hemoglobin of 5.6 with low ferritin.  Patient reports that he has not felt any symptoms until last 2 days, where he has had shortness of breath with walking 50 feet.  He states this only occurs during exertion and gets better at rest.  He states that he gets more short of breath with talking.  He reports chronic fatigue.  He denies any chest pain, diaphoresis, fevers, chills, abdominal pain, nausea, vomiting, diarrhea, or leg swelling.  Patient denies any lightheadedness or dizziness.  Patient does report that he has a cough, but does not bring anything up.  He denies any sick contacts.  Cardiology was at bedside during my exam, and stated that the patient will need an echo, and they will work the patient up for arrhythmia once the patient's hemoglobin is  stable.  ED Course: Initial evaluation in the ED showed the patient having initial vital signs of decreased heart rate 41, respiratory rate 20, blood pressure 102/69, satting at 93% on room air.  Patient is afebrile.  Patient's hemoglobin in the emergency room noted to be 6.2.  Initial troponin elevated at 23.  Type and screen was ordered, and 2 units blood transfusion was ordered.  Meds: Neurontin 600 mg twice daily Plavix 75 mg twice daily Valsartan as needed Turmeric twice daily Multivitamin daily  Past Medical History  Past Surgical History:  Procedure Laterality Date   EYE SURGERY     IR PATIENT EVAL TECH 0-60 MINS  08/01/2018   LAPAROTOMY N/A 07/03/2018   Procedure: EXPLORATORY LAPAROTOMY, LYSIS OF ADHESION, BOWEL RESECTION, RESECTION MESTASTATIC TUMOR;  Surgeon: Jovita Kussmaul, MD;  Location: [place];  Service: General;  Laterality: N/A;   NASAL FRACTURE SURGERY      Social:  Lives With: Alone Occupation: Retired Support: N/A Level of Function: Independent with all ADLs PCP: [facility]ne [facility] [phone] Substances: None  Family History: Father with history of multiple MIs  Allergies: Allergies as of 11/12/2021 - Review Complete 11/12/2021  Allergen Reaction Noted   Levofloxacin Swelling 04/13/2013   Midazolam Swelling 04/13/2013    Review of Systems: A complete ROS was negative except as per  HPI.    Constitutional: Patient endorses chronic fatigue, but denies any fever or chills. Eye: Patient denies any vision loss Respiratory: Patient endorses shortness of breath on exertion and cough, but denies any hemoptysis Cardiovascular: Patient denies any chest pain GI: Patient denies any nausea, vomiting, diarrhea, hematochezia, melena, or hematemesis Neuro: Patient denies any lightheadedness or dizziness   OBJECTIVE:   Physical Exam: Blood pressure 121/74, pulse 79, temperature 98 F (36.7 C), temperature source Oral, resp. rate  (!) 22, SpO2 98 %.  Constitutional: Patient is resting in bed, noticeably short of breath during talking on exam, taking pauses while speaking, with no desaturation and oxygen noted HENT: Mildly pale mucosa noted, dry mucous membranes Eyes: Pale conjunctiva noted Cardiovascular: Bradycardia with normal rhythm Pulmonary/Chest: Crackles noted to right lower lobe Abdominal: soft, non-tender, non-distended Extremities: 1+ pitting edema noted to bilateral lower extremities Skin: warm and dry  Labs: CBC    Component Value Date/Time   WBC 8.3 11/12/2021 1259   RBC 2.70 (L) 11/12/2021 1623   RBC 2.75 (L) 11/12/2021 1259   HGB 6.2 (LL) 11/12/2021 1259   HCT 23.7 (L) 11/12/2021 1259   PLT 134 (L) 11/12/2021 1259   MCV 86.2 11/12/2021 1259   MCH 22.5 (L) 11/12/2021 1259   MCHC 26.2 (L) 11/12/2021 1259   RDW 18.9 (H) 11/12/2021 1259   LYMPHSABS 1.0 07/18/2018 1302   MONOABS 0.8 07/18/2018 1302   EOSABS 0.7 (H) 07/18/2018 1302   BASOSABS 0.0 07/18/2018 1302     CMP     Component Value Date/Time   NA 141 11/12/2021 1259   K 5.0 11/12/2021 1259   CL 114 (H) 11/12/2021 1259   CO2 19 (L) 11/12/2021 1259   GLUCOSE 103 (H) 11/12/2021 1259   BUN 34 (H) 11/12/2021 1259   CREATININE 2.44 (H) 11/12/2021 1259   CREATININE 0.91 07/18/2018 1302   CALCIUM 8.8 (L) 11/12/2021 1259   PROT 6.2 (L) 11/12/2021 1259   ALBUMIN 3.3 (L) 11/12/2021 1259   AST 22 11/12/2021 1259   AST 24 07/18/2018 1302   ALT 27 11/12/2021 1259   ALT 35 07/18/2018 1302   ALKPHOS 50 11/12/2021 1259   BILITOT 0.4 11/12/2021 1259   BILITOT 0.2 (L) 07/18/2018 1302   GFRNONAA 27 (L) 11/12/2021 1259   GFRNONAA >60 07/18/2018 1302   GFRAA >60 07/18/2018 1302    Imaging: N/A  EKG: personally reviewed my interpretation is showing second-degree heart block type II with a rate of 60.  With left axis. No ST segment elevation noted.  ASSESSMENT & PLAN:   Assessment & Plan by Problem: Principal Problem:   Symptomatic  anemia   Jack Fuentes is a 76 y.o. person living with a history of non-Hodgkin's lymphoma causing small bowel obstruction status post tumor removal, chronic fatigue, hypertension presenting with hemoglobin of 6.2.  Patient admitted for further work-up and treatment for symptomatic anemia.  #Symptomatic anemia #Iron deficiency anemia  Patient with a month-long history of anemia noted going back to August 15.  Initially, on October 19, 2021, patient had a hemoglobin of 5.6.  Patient did not know this, and when he looked at his MyChart account, he saw this 8 days ago and decided to present to the ED after developing shortness of breath.  Initial vital signs showing decreased heart rate 41, respiratory rate 20, blood pressure 102/69, satting at 93% on room air.  Patient does seem hemodynamically stable at this point.  CBC showing hemoglobin of 6.2. Vitamin B12 661,  ferritin 6, iron 14, TIBC 507, folate still pending.  It is unknown on what may be causing this anemia. Given low hemoglobin, it does seem likely that shortness of breath could be occurring due to low hemoglobin and increased oxygen demand.  Less likely COPD, pneumonia, CHF exacerbation given patient has had no sick contacts, is not a smoker, and does not have any history and is euvolemic on exam.  Patient does have history of chemotherapy back in 2001 for non-Hodgkin's lymphoma.  There was suspicion that it could be occult GI bleed, given the patient has been compensating, but with the absence of hematemesis or melena and a negative Hemoccult test, this is less likely.  This could be related to vitamin B-12, folate, or iron deficiency.  Aplastic anemia was also taken under consideration, but given no recent chemoradiation, this is less likely.  There could also be concern for anemia of chronic disease, unsure of baseline kidney function, but patient presented with elevated creatinine of 2.44.  Another consideration could be evaluation for colon  cancer as this is unexplained iron deficiency anemia.  Consider GI outpatient follow-up with colonoscopy for colon cancer rule out. 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  He states that he has stopped his Plavix 1 week ago given that his hemoglobin was low. Most recent echocardiogram in 2020 showing EF of 45 to 50%.  Initial troponin 23 with next troponin 4 hours later at 21.  This is likely due to possible demand ischemia from decreased hemoglobin of 6.2.  Patient denies any chest pain.  Troponin has not been trending up, less likely ACS at this time.  Plan to give patient to units of packed red blood cells. -Replete blood volume with 2 unit pRBCs -Hold home Plavix -Continue to monitor for worsening respiratory status  #Second-degree AV block (Mobitz type II) -EKG showing second-degree AV block Mobitz type II.  This could be etiology of shortness of breath as well as chronic fatigue, but given acute severe anemia, less likely the etiology.  We will continue to assess EKG during hospitalization -Cardiology following who recommends treating acute symptomatic anemia and then reassessing for symptoms related to arrhythmia -Cardiology recommending evaluation with EP to consider possible pacemaker once acute symptoms of anemia have resolved -Echo pending -Continue to monitor -Continue telemetry  #AKI, unspecified -Patient's creatinine is 2.44, with unknown baseline within the last 2 years.  The most recent creatinine on file is on 08/01/2019 showing creatinine of 1.5. -This could be AKI likely driven by worsening anemia -This could be progression of chronic kidney disease versus acute AKI, will continue to  work-up -Continue to monitor BMP -If this is chronic kidney disease, this could be contributing to anemia  #Chronic neuropathy -Resume Home Neurontin 600 mg BID  Diet: Heart diet VTE: SCDs IVF: N/A Code: Full  Prior to Admission Living Arrangement: Home, living alone Anticipated Discharge Location: Home Barriers to Discharge: Clinical improvement   Dispo: Admit patient to Inpatient with expected length of stay greater than 2 midnights.  Signed: Leigh Aurora, [place] [facility] Resident PGY-1 [phone] 11/12/2021, 8:37 PM

## 2021-11-12 NOTE — ED Notes (Signed)
Pt blood work showed hgb 5.6 and PCP advised to go to ED. pt reports SOB and fatigue has gotten worse. Pt verbalized able to take self to ED.

## 2021-11-13 ENCOUNTER — Encounter (HOSPITAL_COMMUNITY): Payer: Self-pay | Admitting: Internal Medicine

## 2021-11-13 ENCOUNTER — Other Ambulatory Visit: Payer: Self-pay

## 2021-11-13 ENCOUNTER — Inpatient Hospital Stay (HOSPITAL_COMMUNITY): Payer: Medicare Other

## 2021-11-13 ENCOUNTER — Observation Stay (HOSPITAL_COMMUNITY): Payer: Medicare Other

## 2021-11-13 DIAGNOSIS — N132 Hydronephrosis with renal and ureteral calculous obstruction: Secondary | ICD-10-CM | POA: Diagnosis present

## 2021-11-13 DIAGNOSIS — D509 Iron deficiency anemia, unspecified: Principal | ICD-10-CM

## 2021-11-13 DIAGNOSIS — I5042 Chronic combined systolic (congestive) and diastolic (congestive) heart failure: Secondary | ICD-10-CM | POA: Diagnosis present

## 2021-11-13 DIAGNOSIS — G629 Polyneuropathy, unspecified: Secondary | ICD-10-CM | POA: Diagnosis present

## 2021-11-13 DIAGNOSIS — N184 Chronic kidney disease, stage 4 (severe): Secondary | ICD-10-CM | POA: Diagnosis present

## 2021-11-13 DIAGNOSIS — K760 Fatty (change of) liver, not elsewhere classified: Secondary | ICD-10-CM | POA: Diagnosis present

## 2021-11-13 DIAGNOSIS — N179 Acute kidney failure, unspecified: Secondary | ICD-10-CM | POA: Diagnosis present

## 2021-11-13 DIAGNOSIS — I700 Atherosclerosis of aorta: Secondary | ICD-10-CM | POA: Diagnosis present

## 2021-11-13 DIAGNOSIS — J90 Pleural effusion, not elsewhere classified: Secondary | ICD-10-CM | POA: Diagnosis not present

## 2021-11-13 DIAGNOSIS — I441 Atrioventricular block, second degree: Secondary | ICD-10-CM | POA: Diagnosis present

## 2021-11-13 DIAGNOSIS — Z888 Allergy status to other drugs, medicaments and biological substances status: Secondary | ICD-10-CM | POA: Diagnosis not present

## 2021-11-13 DIAGNOSIS — I447 Left bundle-branch block, unspecified: Secondary | ICD-10-CM | POA: Diagnosis not present

## 2021-11-13 DIAGNOSIS — Z79899 Other long term (current) drug therapy: Secondary | ICD-10-CM | POA: Diagnosis not present

## 2021-11-13 DIAGNOSIS — E785 Hyperlipidemia, unspecified: Secondary | ICD-10-CM | POA: Diagnosis present

## 2021-11-13 DIAGNOSIS — D649 Anemia, unspecified: Secondary | ICD-10-CM | POA: Diagnosis present

## 2021-11-13 DIAGNOSIS — R001 Bradycardia, unspecified: Secondary | ICD-10-CM | POA: Diagnosis not present

## 2021-11-13 DIAGNOSIS — I248 Other forms of acute ischemic heart disease: Secondary | ICD-10-CM | POA: Diagnosis present

## 2021-11-13 DIAGNOSIS — E639 Nutritional deficiency, unspecified: Secondary | ICD-10-CM | POA: Diagnosis present

## 2021-11-13 DIAGNOSIS — I10 Essential (primary) hypertension: Secondary | ICD-10-CM | POA: Diagnosis not present

## 2021-11-13 DIAGNOSIS — J9811 Atelectasis: Secondary | ICD-10-CM | POA: Diagnosis present

## 2021-11-13 DIAGNOSIS — I130 Hypertensive heart and chronic kidney disease with heart failure and stage 1 through stage 4 chronic kidney disease, or unspecified chronic kidney disease: Secondary | ICD-10-CM | POA: Diagnosis present

## 2021-11-13 DIAGNOSIS — I2510 Atherosclerotic heart disease of native coronary artery without angina pectoris: Secondary | ICD-10-CM | POA: Diagnosis present

## 2021-11-13 DIAGNOSIS — I472 Ventricular tachycardia, unspecified: Secondary | ICD-10-CM | POA: Diagnosis present

## 2021-11-13 DIAGNOSIS — N281 Cyst of kidney, acquired: Secondary | ICD-10-CM | POA: Diagnosis not present

## 2021-11-13 DIAGNOSIS — Z881 Allergy status to other antibiotic agents status: Secondary | ICD-10-CM | POA: Diagnosis not present

## 2021-11-13 DIAGNOSIS — R7989 Other specified abnormal findings of blood chemistry: Secondary | ICD-10-CM | POA: Diagnosis present

## 2021-11-13 DIAGNOSIS — I452 Bifascicular block: Secondary | ICD-10-CM | POA: Diagnosis present

## 2021-11-13 DIAGNOSIS — I5022 Chronic systolic (congestive) heart failure: Secondary | ICD-10-CM | POA: Diagnosis not present

## 2021-11-13 DIAGNOSIS — I25119 Atherosclerotic heart disease of native coronary artery with unspecified angina pectoris: Secondary | ICD-10-CM | POA: Diagnosis not present

## 2021-11-13 DIAGNOSIS — Z8572 Personal history of non-Hodgkin lymphomas: Secondary | ICD-10-CM | POA: Diagnosis not present

## 2021-11-13 DIAGNOSIS — Z9221 Personal history of antineoplastic chemotherapy: Secondary | ICD-10-CM | POA: Diagnosis not present

## 2021-11-13 DIAGNOSIS — I252 Old myocardial infarction: Secondary | ICD-10-CM | POA: Diagnosis not present

## 2021-11-13 LAB — DIFFERENTIAL
Abs Immature Granulocytes: 0.02 10*3/uL (ref 0.00–0.07)
Basophils Absolute: 0 10*3/uL (ref 0.0–0.1)
Basophils Relative: 0 %
Eosinophils Absolute: 0.2 10*3/uL (ref 0.0–0.5)
Eosinophils Relative: 2 %
Immature Granulocytes: 0 %
Lymphocytes Relative: 14 %
Lymphs Abs: 1.4 10*3/uL (ref 0.7–4.0)
Monocytes Absolute: 1.4 10*3/uL — ABNORMAL HIGH (ref 0.1–1.0)
Monocytes Relative: 14 %
Neutro Abs: 6.9 10*3/uL (ref 1.7–7.7)
Neutrophils Relative %: 70 %

## 2021-11-13 LAB — BASIC METABOLIC PANEL
Anion gap: 9 (ref 5–15)
BUN: 36 mg/dL — ABNORMAL HIGH (ref 8–23)
CO2: 18 mmol/L — ABNORMAL LOW (ref 22–32)
Calcium: 8.6 mg/dL — ABNORMAL LOW (ref 8.9–10.3)
Chloride: 111 mmol/L (ref 98–111)
Creatinine, Ser: 2.29 mg/dL — ABNORMAL HIGH (ref 0.61–1.24)
GFR, Estimated: 29 mL/min — ABNORMAL LOW (ref >60)
Glucose, Bld: 95 mg/dL (ref 70–99)
Potassium: 4.9 mmol/L (ref 3.5–5.1)
Sodium: 138 mmol/L (ref 135–145)

## 2021-11-13 LAB — CBC
HCT: 25.5 % — ABNORMAL LOW (ref 39.0–52.0)
HCT: 26.6 % — ABNORMAL LOW (ref 39.0–52.0)
Hemoglobin: 7.5 g/dL — ABNORMAL LOW (ref 13.0–17.0)
Hemoglobin: 7.8 g/dL — ABNORMAL LOW (ref 13.0–17.0)
MCH: 24.1 pg — ABNORMAL LOW (ref 26.0–34.0)
MCH: 24.1 pg — ABNORMAL LOW (ref 26.0–34.0)
MCHC: 29.4 g/dL — ABNORMAL LOW (ref 30.0–36.0)
MCHC: 29.3 g/dL — ABNORMAL LOW (ref 30.0–36.0)
MCV: 82 fL (ref 80.0–100.0)
MCV: 82.4 fL (ref 80.0–100.0)
Platelets: 124 10*3/uL — ABNORMAL LOW (ref 150–400)
Platelets: 132 10*3/uL — ABNORMAL LOW (ref 150–400)
RBC: 3.11 MIL/uL — ABNORMAL LOW (ref 4.22–5.81)
RBC: 3.23 MIL/uL — ABNORMAL LOW (ref 4.22–5.81)
RDW: 18.6 % — ABNORMAL HIGH (ref 11.5–15.5)
RDW: 18.7 % — ABNORMAL HIGH (ref 11.5–15.5)
WBC: 8 10*3/uL (ref 4.0–10.5)
WBC: 9.8 10*3/uL (ref 4.0–10.5)
nRBC: 0 % (ref 0.0–0.2)
nRBC: 0.2 % (ref 0.0–0.2)

## 2021-11-13 LAB — TECHNOLOGIST SMEAR REVIEW

## 2021-11-13 LAB — ECHOCARDIOGRAM COMPLETE
AR max vel: 2.47 cm2
AV Area VTI: 2.44 cm2
AV Area mean vel: 2.39 cm2
AV Mean grad: 5 mmHg
AV Peak grad: 8.9 mmHg
Ao pk vel: 1.49 m/s
Area-P 1/2: 4.8 cm2
Height: 68 in
MV M vel: 4.21 m/s
MV Peak grad: 71 mmHg
MV VTI: 2.08 cm2
P 1/2 time: 423 msec
Radius: 0.3 cm
S' Lateral: 3 cm
Weight: 2816 oz

## 2021-11-13 LAB — HEMOGLOBIN AND HEMATOCRIT, BLOOD
HCT: 30.3 % — ABNORMAL LOW (ref 39.0–52.0)
Hemoglobin: 9 g/dL — ABNORMAL LOW (ref 13.0–17.0)

## 2021-11-13 LAB — LACTATE DEHYDROGENASE: LDH: 181 U/L (ref 98–192)

## 2021-11-13 LAB — PREPARE RBC (CROSSMATCH)

## 2021-11-13 MED ORDER — SODIUM CHLORIDE 0.9% IV SOLUTION
Freq: Once | INTRAVENOUS | Status: AC
Start: 2021-11-13 — End: 2021-11-13

## 2021-11-13 MED ORDER — ORAL CARE MOUTH RINSE: 15.0000 mL | OROMUCOSAL | Status: DC | PRN

## 2021-11-13 MED ORDER — SODIUM CHLORIDE 0.9 % IV SOLN: INTRAVENOUS | Status: DC

## 2021-11-13 MED ORDER — SODIUM CHLORIDE 0.9 % IV SOLN: INTRAVENOUS | Status: AC

## 2021-11-13 MED ORDER — GABAPENTIN 600 MG PO TABS
300.0000 mg | ORAL_TABLET | Freq: Two times a day (BID) | ORAL | Status: DC
  Administered 2021-11-13 – 2021-11-18 (×7): 300 mg via ORAL
  Filled 2021-11-13 (×8): qty 1

## 2021-11-13 MED ORDER — FUROSEMIDE 10 MG/ML IJ SOLN
20.0000 mg | Freq: Once | INTRAMUSCULAR | Status: AC
  Administered 2021-11-13: 20 mg via INTRAVENOUS
  Filled 2021-11-13 (×2): qty 2

## 2021-11-13 MED ORDER — SODIUM CHLORIDE 0.9 % IV SOLN
250.0000 mg | Freq: Once | INTRAVENOUS | Status: AC
  Administered 2021-11-14: 250 mg via INTRAVENOUS
  Filled 2021-11-13: qty 20

## 2021-11-13 MED ORDER — TAMSULOSIN HCL 0.4 MG PO CAPS
0.4000 mg | ORAL_CAPSULE | Freq: Every day | ORAL | Status: DC
  Administered 2021-11-13 – 2021-11-18 (×6): 0.4 mg via ORAL
  Filled 2021-11-13 (×6): qty 1

## 2021-11-13 MED ORDER — SODIUM CHLORIDE 0.9 % IV BOLUS: 1000.0000 mL | Freq: Once | INTRAVENOUS | Status: DC

## 2021-11-13 MED ORDER — SODIUM CHLORIDE 0.9 % IV SOLN
250.0000 mg | Freq: Once | INTRAVENOUS | Status: AC
  Administered 2021-11-13: 250 mg via INTRAVENOUS
  Filled 2021-11-13: qty 20

## 2021-11-13 NOTE — Progress Notes (Signed)
Rounding Note    Patient Name: Jack Fuentes Date of Encounter: 11/13/2021  [facility] Cardiologist: None   Subjective   ***  Inpatient Medications    Scheduled Meds:  furosemide  20 mg Intravenous Once   gabapentin  300 mg Oral BID   multivitamin with minerals  1 tablet Oral Daily   Continuous Infusions:  sodium chloride     [START ON 11/14/2021] ferric gluconate (FERRLECIT) IVPB     PRN Meds: acetaminophen **OR** acetaminophen, ondansetron **OR** ondansetron (ZOFRAN) IV, mouth rinse, senna-docusate   Vital Signs    Vitals:   11/13/21 1417 11/13/21 1428 11/13/21 1443 11/13/21 1756  BP: 118/65  125/72 117/67  Pulse: (!) 48 (!) 45 76 (!) 45  Resp: '18  18 20  '$ Temp: 98.7 F (37.1 C)  98 F (36.7 C) 97.7 F (36.5 C)  TempSrc: Oral  Oral Oral  SpO2: 91% 98% 97% 98%  Weight:      Height:        Intake/Output Summary (Last 24 hours) at 11/13/2021 1923 Last data filed at 11/13/2021 1834 Gross per 24 hour  Intake 1886.5 ml  Output --  Net 1886.5 ml       11/12/2021    9:04 PM 08/08/2018    2:23 PM 08/06/2018   11:33 AM  Last 3 Weights  Weight (lbs) 176 lb 145 lb 14.4 oz 145 lb  Weight (kg) 79.833 kg 66.18 kg 65.772 kg      Telemetry    SR; frequent runs Mobitz type II AVB, brief run of NSVT - Personally Reviewed  ECG    No new tracing - Personally Reviewed  Physical Exam   GEN: No acute distress.   Neck: No JVD Cardiac: RRR, no murmurs  Respiratory: Clear to auscultation bilaterally. GI: Soft, nontender, non-distended  MS: No edema; No deformity. Neuro:  Nonfocal  Psych: Normal affect   Labs    High Sensitivity Troponin:   Recent Labs  Lab 11/12/21 1259 11/12/21 1623  TROPONINIHS 23* 21*      Chemistry Recent Labs  Lab 11/12/21 1259 11/13/21 0232  NA 141 138  K 5.0 4.9  CL 114* 111  CO2 19* 18*  GLUCOSE 103* 95  BUN 34* 36*  CREATININE 2.44* 2.29*  CALCIUM 8.8* 8.6*  PROT 6.2*  --   ALBUMIN 3.3*  --   AST 22  --    ALT 27  --   ALKPHOS 50  --   BILITOT 0.4  --   GFRNONAA 27* 29*  ANIONGAP 8 9     Lipids No results for input(s): "CHOL", "TRIG", "HDL", "LABVLDL", "LDLCALC", "CHOLHDL" in the last 168 hours.  Hematology Recent Labs  Lab 11/12/21 1259 11/12/21 1623 11/13/21 0232 11/13/21 1026  WBC 8.3  --  8.0 9.8  RBC 2.75* 2.70* 3.11* 3.23*  HGB 6.2*  --  7.5* 7.8*  HCT 23.7*  --  25.5* 26.6*  MCV 86.2  --  82.0 82.4  MCH 22.5*  --  24.1* 24.1*  MCHC 26.2*  --  29.4* 29.3*  RDW 18.9*  --  18.6* 18.7*  PLT 134*  --  124* 132*    Thyroid No results for input(s): "TSH", "FREET4" in the last 168 hours.  BNPNo results for input(s): "BNP", "PROBNP" in the last 168 hours.  DDimer No results for input(s): "DDIMER" in the last 168 hours.   Radiology    ECHOCARDIOGRAM COMPLETE  Result Date: 11/13/2021    ECHOCARDIOGRAM  REPORT   Patient Name:   ELTON HEID Date of Exam: 11/13/2021 Medical Rec #:  [id number]         Height:       68.0 in Accession #:    [id number]        Weight:       176.0 lb Date of Birth:  November 01, 1945         BSA:          1.936 m Patient Age:    76 years          BP:           112/66 mmHg Patient Gender: M                 HR:           74 bpm. Exam Location:  Inpatient Procedure: 2D Echo, Color Doppler and Cardiac Doppler Indications:    Heart block, 2nd degree  History:        Patient has prior history of Echocardiogram examinations, most                 recent 07/31/2018. Arrythmias:LBBB; Risk Factors:Dyslipidemia.  Sonographer:    Memory Argue Referring Phys: [place]  1. Left ventricular ejection fraction, by estimation, is 55 to 60%. The left ventricle has normal function. The left ventricle has no regional wall motion abnormalities. Left ventricular diastolic parameters are consistent with Grade II diastolic dysfunction (pseudonormalization). Elevated left atrial pressure.  2. Right ventricular systolic function is normal. The right ventricular size is  normal. There is moderately elevated pulmonary artery systolic pressure.  3. Left atrial size was mild to moderately dilated.  4. The mitral valve is abnormal. Moderate mitral valve regurgitation. No evidence of mitral stenosis.  5. The tricuspid valve is abnormal. Tricuspid valve regurgitation is mild to moderate.  6. The aortic valve was not well visualized. There is mild calcification of the aortic valve. There is mild thickening of the aortic valve. Aortic valve regurgitation is mild to moderate. No aortic stenosis is present.  7. The inferior vena cava is dilated in size with >50% respiratory variability, suggesting right atrial pressure of 8 mmHg. FINDINGS  Left Ventricle: Left ventricular ejection fraction, by estimation, is 55 to 60%. The left ventricle has normal function. The left ventricle has no regional wall motion abnormalities. The left ventricular internal cavity size was normal in size. There is  no left ventricular hypertrophy. Left ventricular diastolic parameters are consistent with Grade II diastolic dysfunction (pseudonormalization). Elevated left atrial pressure. Right Ventricle: The right ventricular size is normal. Right vetricular wall thickness was not well visualized. Right ventricular systolic function is normal. There is moderately elevated pulmonary artery systolic pressure. The tricuspid regurgitant velocity is 3.27 m/s, and with an assumed right atrial pressure of 8 mmHg, the estimated right ventricular systolic pressure is 24.2 mmHg. Left Atrium: Left atrial size was mild to moderately dilated. Right Atrium: Right atrial size was normal in size. Pericardium: There is no evidence of pericardial effusion. Mitral Valve: The mitral valve is abnormal. There is mild thickening of the mitral valve leaflet(s). There is mild calcification of the mitral valve leaflet(s). Mild mitral annular calcification. Moderate mitral valve regurgitation. No evidence of mitral  valve stenosis. MV peak  gradient, 8.1 mmHg. The mean mitral valve gradient is 4.0 mmHg. Tricuspid Valve: The tricuspid valve is abnormal. Tricuspid valve regurgitation is mild to moderate. No evidence of tricuspid stenosis. Aortic Valve:  The aortic valve was not well visualized. There is mild calcification of the aortic valve. There is mild thickening of the aortic valve. There is mild aortic valve annular calcification. Aortic valve regurgitation is mild to moderate. Aortic regurgitation PHT measures 423 msec. No aortic stenosis is present. Aortic valve mean gradient measures 5.0 mmHg. Aortic valve peak gradient measures 8.9 mmHg. Aortic valve area, by VTI measures 2.44 cm. Pulmonic Valve: The pulmonic valve was not well visualized. Pulmonic valve regurgitation is not visualized. No evidence of pulmonic stenosis. Aorta: The aortic root is normal in size and structure. Venous: The inferior vena cava is dilated in size with greater than 50% respiratory variability, suggesting right atrial pressure of 8 mmHg. IAS/Shunts: No atrial level shunt detected by color flow Doppler.  LEFT VENTRICLE PLAX 2D LVIDd:         4.20 cm   Diastology LVIDs:         3.00 cm   LV e' medial:    5.55 cm/s LV PW:         0.90 cm   LV E/e' medial:  24.9 LV IVS:        0.90 cm   LV e' lateral:   9.68 cm/s LVOT diam:     2.20 cm   LV E/e' lateral: 14.3 LV SV:         73 LV SV Index:   38 LVOT Area:     3.80 cm  RIGHT VENTRICLE RV S prime:     11.10 cm/s TAPSE (M-mode): 1.9 cm LEFT ATRIUM             Index        RIGHT ATRIUM           Index LA diam:        3.50 cm 1.81 cm/m   RA Area:     14.40 cm LA Vol (A2C):   69.8 ml 36.06 ml/m  RA Volume:   31.80 ml  16.43 ml/m LA Vol (A4C):   91.9 ml 47.48 ml/m LA Biplane Vol: 80.8 ml 41.74 ml/m  AORTIC VALVE AV Area (Vmax):    2.47 cm AV Area (Vmean):   2.39 cm AV Area (VTI):     2.44 cm AV Vmax:           149.00 cm/s AV Vmean:          101.000 cm/s AV VTI:            0.301 m AV Peak Grad:      8.9 mmHg AV Mean  Grad:      5.0 mmHg LVOT Vmax:         97.00 cm/s LVOT Vmean:        63.400 cm/s LVOT VTI:          0.193 m LVOT/AV VTI ratio: 0.64 AI PHT:            423 msec  AORTA Ao Root diam: 3.30 cm MITRAL VALVE                 TRICUSPID VALVE MV Area (PHT): 4.80 cm      TR Peak grad:   42.8 mmHg MV Area VTI:   2.08 cm      TR Vmax:        327.00 cm/s MV Peak grad:  8.1 mmHg MV Mean grad:  4.0 mmHg      SHUNTS MV Vmax:       1.42 m/s  Systemic VTI:  0.19 m MV Vmean:      93.0 cm/s     Systemic Diam: 2.20 cm MV Decel Time: 158 msec MR Peak grad:   71.0 mmHg MR Mean grad:   56.0 mmHg MR Vmax:        421.40 cm/s MR Vmean:       352.0 cm/s MR PISA:        0.57 cm MR PISA Radius: 0.30 cm MV E velocity: 138.00 cm/s MV A velocity: 72.40 cm/s MV E/A ratio:  1.91 Carlyle Dolly MD Electronically signed by Carlyle Dolly MD Signature Date/Time: 11/13/2021/1:31:52 PM    Final     Cardiac Studies   TTE 07/2018: IMPRESSIONS     1. The left ventricle has mildly reduced systolic function, with an  ejection fraction of 45-50%. The cavity size was normal. Left ventricular  diastolic Doppler parameters are consistent with pseudonormalization.  Elevated mean left atrial pressure.   2. Endocardial border defnintion is poor. There appears to be a mild  reduction in global systolic function without focal wall motion  abnormalities.   3. The right ventricle has normal systolic function. The cavity was  normal. There is no increase in right ventricular wall thickness. Right  ventricular systolic pressure could not be assessed.   4. No evidence of mitral valve stenosis.   5. The aortic valve is tricuspid. Mild thickening of the aortic valve.  Mild calcification of the aortic valve. Aortic valve regurgitation is mild  to moderate by color flow Doppler. No stenosis of the aortic valve.   6. The ascending aorta is normal in size and structure.   7. The average left ventricular global longitudinal strain is -11.9  %.  Patient Profile     76 y.o. male with a hx of non-Hodgkins lymphoma s/p chemo and XRT, HLD, peripheral neuropathy, normocytic anemia, and chronic RBBB and LAFB who presented with profound anemia with suspected AKI on CKD with found to have intermittent Mobitz type II AVB on telemetry for which Cardiology was consulted.   Assessment & Plan    #Type II AVB: Noted on telemetry and ECG on arrival. Occurred in the setting of profound anemia and suspected AKI on CKD although does have RBBB and LAFB and is at risk of having high grade conduction disease. He is not on any nodal agents. Denies any history of syncope or presyncope. Has had SOB and fatigue over the past 2 weeks but this is in the setting of profound anemia. Would recommend treating anemia and suspected AKI and continuing to monitor on telemetry. Suspect he will likely need PPM in future. Will have EP evaluate on Monday. -Manage anemia and AKI per primary team -Will consult EP on Monday for further evaluation as suspect he does have underlying conduction disease (had pre-existing RBBB and LAFB) -Continue telemetry -Not on nodal agents -Follow-up TTE -Keep K>4, Mg>2   #?History of CAD: Patient reports history of abnormal stress test with "inferior scar" for which he was started on plavix. Denies history of cath. Denies anginal symptoms and states his dyspnea on exertion began after anemia. Will check TTE and hold plavix for now. Can likely plan for further work-up as outpatient. -Hold plavix -Check TTE as above   #Chronic Systolic HF: EF 07-37% on TTE in 2020 with history of abnormal stress test in the past but no history of cath. Does not appear overloaded on exam. Cannot add GDMT due to AKI on CKD and AVB. Repeat TTE pending. -  Follow-up repeat TTE -No BB due to AVB -No ACE/ARB/ARNI/spiro due to AKI -Will add GDMT as able -Monitor I/Os and daily weights   #Normocytic Anemia: HgB 6 on admission with SOB and fatigue. Denies any  bleeding but has been on plavix. Received 2u pRBCs on 9/8. -Holding plavix -Work-up per primary team   #AKI on suspected CKD: Cr 0.9 in 2020 now 2.44. Unknown recent baseline. Likely AKI driven by worsening anemia.  -Management per primary         For questions or updates, please contact [place] Please consult [email or web address] for contact info under        Signed, Freada Bergeron, MD  11/13/2021, 7:23 PM

## 2021-11-13 NOTE — Progress Notes (Signed)
MD Jodi Mourning updated the hemoglobin result this am, post blood transfusion and had 4 beats run of VTach, asymptomatic. Will continue to monitor pt.

## 2021-11-13 NOTE — Progress Notes (Signed)
Rounding Note    Patient Name: Jack Fuentes Date of Encounter: 11/13/2021  [place] Cardiologist: None   Subjective   Feels slightly better. Still with fatigue. No chest pain or dizziness.  Continues to have frequent episodes of Mobitz type II AVB on the monitor  Cr 2.44>2.29 HgB 6.2>7.5 after 2 units pRBC  Inpatient Medications    Scheduled Meds:  furosemide  20 mg Intravenous Once   gabapentin  600 mg Oral BID   multivitamin with minerals  1 tablet Oral Daily   Continuous Infusions:  sodium chloride     ferric gluconate (FERRLECIT) IVPB 250 mg (11/13/21 0853)   PRN Meds: acetaminophen **OR** acetaminophen, ondansetron **OR** ondansetron (ZOFRAN) IV, mouth rinse, senna-docusate   Vital Signs    Vitals:   11/12/21 2203 11/12/21 2247 11/13/21 0025 11/13/21 0757  BP: (!) 103/56 122/68 112/66 115/77  Pulse: 79 78 81 68  Resp: '18 18 18 16  '$ Temp: 98.3 F (36.8 C) 97.8 F (36.6 C) 97.7 F (36.5 C) 97.7 F (36.5 C)  TempSrc: Oral Oral Oral Oral  SpO2: 96% 100% 95% 98%  Weight:      Height:        Intake/Output Summary (Last 24 hours) at 11/13/2021 1007 Last data filed at 11/13/2021 0025 Gross per 24 hour  Intake 601 ml  Output --  Net 601 ml      11/12/2021    9:04 PM 08/08/2018    2:23 PM 08/06/2018   11:33 AM  Last 3 Weights  Weight (lbs) 176 lb 145 lb 14.4 oz 145 lb  Weight (kg) 79.833 kg 66.18 kg 65.772 kg      Telemetry    SR; frequent runs Mobitz type II AVB, brief run of NSVT - Personally Reviewed  ECG    No new tracing - Personally Reviewed  Physical Exam   GEN: No acute distress.   Neck: No JVD Cardiac: RRR, no murmurs  Respiratory: Clear to auscultation bilaterally. GI: Soft, nontender, non-distended  MS: No edema; No deformity. Neuro:  Nonfocal  Psych: Normal affect   Labs    High Sensitivity Troponin:   Recent Labs  Lab 11/12/21 1259 11/12/21 1623  TROPONINIHS 23* 21*     Chemistry Recent Labs  Lab  11/12/21 1259 11/13/21 0232  NA 141 138  K 5.0 4.9  CL 114* 111  CO2 19* 18*  GLUCOSE 103* 95  BUN 34* 36*  CREATININE 2.44* 2.29*  CALCIUM 8.8* 8.6*  PROT 6.2*  --   ALBUMIN 3.3*  --   AST 22  --   ALT 27  --   ALKPHOS 50  --   BILITOT 0.4  --   GFRNONAA 27* 29*  ANIONGAP 8 9    Lipids No results for input(s): "CHOL", "TRIG", "HDL", "LABVLDL", "LDLCALC", "CHOLHDL" in the last 168 hours.  Hematology Recent Labs  Lab 11/12/21 1259 11/12/21 1623 11/13/21 0232  WBC 8.3  --  8.0  RBC 2.75* 2.70* 3.11*  HGB 6.2*  --  7.5*  HCT 23.7*  --  25.5*  MCV 86.2  --  82.0  MCH 22.5*  --  24.1*  MCHC 26.2*  --  29.4*  RDW 18.9*  --  18.6*  PLT 134*  --  124*   Thyroid No results for input(s): "TSH", "FREET4" in the last 168 hours.  BNPNo results for input(s): "BNP", "PROBNP" in the last 168 hours.  DDimer No results for input(s): "DDIMER" in the last  168 hours.   Radiology    No results found.  Cardiac Studies   TTE 07/2018: IMPRESSIONS     1. The left ventricle has mildly reduced systolic function, with an  ejection fraction of 45-50%. The cavity size was normal. Left ventricular  diastolic Doppler parameters are consistent with pseudonormalization.  Elevated mean left atrial pressure.   2. Endocardial border defnintion is poor. There appears to be a mild  reduction in global systolic function without focal wall motion  abnormalities.   3. The right ventricle has normal systolic function. The cavity was  normal. There is no increase in right ventricular wall thickness. Right  ventricular systolic pressure could not be assessed.   4. No evidence of mitral valve stenosis.   5. The aortic valve is tricuspid. Mild thickening of the aortic valve.  Mild calcification of the aortic valve. Aortic valve regurgitation is mild  to moderate by color flow Doppler. No stenosis of the aortic valve.   6. The ascending aorta is normal in size and structure.   7. The average left  ventricular global longitudinal strain is -11.9 %.  Patient Profile     76 y.o. male with a hx of non-Hodgkins lymphoma s/p chemo and XRT, HLD, peripheral neuropathy, normocytic anemia, and chronic RBBB and LAFB who presented with profound anemia with suspected AKI on CKD with found to have intermittent Mobitz type II AVB on telemetry for which Cardiology was consulted.   Assessment & Plan    #Type II AVB: Noted on telemetry and ECG on arrival. Occurred in the setting of profound anemia and suspected AKI on CKD although does have RBBB and LAFB and is at risk of having high grade conduction disease. He is not on any nodal agents. Denies any history of syncope or presyncope. Has had SOB and fatigue over the past 2 weeks but this is in the setting of profound anemia. Would recommend treating anemia and suspected AKI and continuing to monitor on telemetry. Suspect he will likely need PPM in future. Will have EP evaluate on Monday. -Manage anemia and AKI per primary team -Will consult EP on Monday for further evaluation as suspect he does have underlying conduction disease (had pre-existing RBBB and LAFB) -Continue telemetry -Not on nodal agents -Follow-up TTE -Keep K>4, Mg>2   #?History of CAD: Patient reports history of abnormal stress test with "inferior scar" for which he was started on plavix. Denies history of cath. Denies anginal symptoms and states his dyspnea on exertion began after anemia. Will check TTE and hold plavix for now. Can likely plan for further work-up as outpatient. -Hold plavix -Check TTE as above   #Chronic Systolic HF: EF 66-44% on TTE in 2020 with history of abnormal stress test in the past but no history of cath. Does not appear overloaded on exam. Cannot add GDMT due to AKI on CKD and AVB. Repeat TTE pending. -Follow-up repeat TTE -No BB due to AVB -No ACE/ARB/ARNI/spiro due to AKI -Will add GDMT as able -Monitor I/Os and daily weights   #Normocytic Anemia: HgB  6 on admission with SOB and fatigue. Denies any bleeding but has been on plavix. Received 2u pRBCs on 9/8. -Holding plavix -Work-up per primary team   #AKI on suspected CKD: Cr 0.9 in 2020 now 2.44. Unknown recent baseline. Likely AKI driven by worsening anemia.  -Management per primary         For questions or updates, please contact [place] Please consult [email or web address] for contact  info under        Signed, Freada Bergeron, MD  11/13/2021, 10:07 AM

## 2021-11-13 NOTE — Progress Notes (Signed)
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  Patient is admitted for further work-up and treatment of symptomatic anemia.  Overnight Events: Overnight patient received 2 units of blood and had post transfusion hbg of 7.5.    Patient reports that he is still short of breath and that he is not feeling that well this morning and that he did get the blood last night. He denies any chest pain. Pateint reports that he does take ibuprofen occasionally but he does not take any reguarlly. He denies the use of nproxen, BC  Objective:  Vital signs in last 24 hours: Vitals:   11/13/21 0800 11/13/21 1417 11/13/21 1428 11/13/21 1443  BP: 115/77 118/65  125/72  Pulse: 69 (!) 48 (!) 45 76  Resp:  18  18  Temp:  98.7 F (37.1 C)  98 F (36.7 C)  TempSrc:  Oral  Oral  SpO2: 98% 91% 98% 97%  Weight:      Height:       Supplemental O2: Room Air SpO2: 97 %   Physical Exam:  Constitutional: Pale appearing.  With noticeable tachypnea on exam HENT: Pale oral mucosa noted Eyes: Pale conjunctiva bilaterally Cardiovascular: regular rate and rhythm, no m/r/g Pulmonary/Chest: Tachypnea appreciated, with crackles noted to bilateral lung bases Abdominal: soft, non-tender, non-distended  Filed Weights   11/12/21 2104  Weight: 79.8 kg     Intake/Output Summary (Last 24 hours) at 11/13/2021 1620 Last data filed at 11/13/2021 0025 Gross per 24 hour  Intake 601 ml  Output --  Net 601 ml   Net IO Since Admission: 601 mL [11/13/21 1620]  Pertinent Labs:    Latest Ref Rng & Units 11/13/2021   10:26 AM 11/13/2021    2:32 AM 11/12/2021   12:59 PM  CBC  WBC 4.0 - 10.5 K/uL 9.8  8.0  8.3   Hemoglobin 13.0 - 17.0 g/dL 7.8  7.5  6.2   Hematocrit 39.0 - 52.0 % 26.6  25.5  23.7    Platelets 150 - 400 K/uL 132  124  134        Latest Ref Rng & Units 11/13/2021    2:32 AM 11/12/2021   12:59 PM 07/18/2018    1:02 PM  CMP  Glucose 70 - 99 mg/dL 95  103  89   BUN 8 - 23 mg/dL 36  34  10   Creatinine 0.61 - 1.24 mg/dL 2.29  2.44  0.91   Sodium 135 - 145 mmol/L 138  141  136   Potassium 3.5 - 5.1 mmol/L 4.9  5.0  3.3   Chloride 98 - 111 mmol/L 111  114  109   CO2 22 - 32 mmol/L '18  19  19   '$ Calcium 8.9 - 10.3 mg/dL 8.6  8.8  8.4   Total Protein 6.5 - 8.1 g/dL  6.2  6.1   Total Bilirubin 0.3 - 1.2 mg/dL  0.4  0.2   Alkaline Phos 38 - 126 U/L  50  108   AST 15 - 41 U/L  22  24   ALT 0 - 44 U/L  27  35     Imaging: ECHOCARDIOGRAM COMPLETE  Result Date: 11/13/2021    ECHOCARDIOGRAM REPORT   Patient Name:   Jack Fuentes Date of  Exam: 11/13/2021 Medical Rec #:  [id number]         Height:       68.0 in Accession #:    [id number]        Weight:       176.0 lb Date of Birth:  [birth year]         BSA:          1.936 m Patient Age:    86 years          BP:           112/66 mmHg Patient Gender: M                 HR:           74 bpm. Exam Location:  Inpatient Procedure: 2D Echo, Color Doppler and Cardiac Doppler Indications:    Heart block, 2nd degree  History:        Patient has prior history of Echocardiogram examinations, most                 recent 07/31/2018. Arrythmias:LBBB; Risk Factors:Dyslipidemia.  Sonographer:    Memory Argue Referring Phys: [place]  1. Left ventricular ejection fraction, by estimation, is 55 to 60%. The left ventricle has normal function. The left ventricle has no regional wall motion abnormalities. Left ventricular diastolic parameters are consistent with Grade II diastolic dysfunction (pseudonormalization). Elevated left atrial pressure.  2. Right ventricular systolic function is normal. The right ventricular size is normal. There is moderately elevated pulmonary artery systolic pressure.  3. Left atrial size was mild to moderately  dilated.  4. The mitral valve is abnormal. Moderate mitral valve regurgitation. No evidence of mitral stenosis.  5. The tricuspid valve is abnormal. Tricuspid valve regurgitation is mild to moderate.  6. The aortic valve was not well visualized. There is mild calcification of the aortic valve. There is mild thickening of the aortic valve. Aortic valve regurgitation is mild to moderate. No aortic stenosis is present.  7. The inferior vena cava is dilated in size with >50% respiratory variability, suggesting right atrial pressure of 8 mmHg. FINDINGS  Left Ventricle: Left ventricular ejection fraction, by estimation, is 55 to 60%. The left ventricle has normal function. The left ventricle has no regional wall motion abnormalities. The left ventricular internal cavity size was normal in size. There is  no left ventricular hypertrophy. Left ventricular diastolic parameters are consistent with Grade II diastolic dysfunction (pseudonormalization). Elevated left atrial pressure. Right Ventricle: The right ventricular size is normal. Right vetricular wall thickness was not well visualized. Right ventricular systolic function is normal. There is moderately elevated pulmonary artery systolic pressure. The tricuspid regurgitant velocity is 3.27 m/s, and with an assumed right atrial pressure of 8 mmHg, the estimated right ventricular systolic pressure is 62.6 mmHg. Left Atrium: Left atrial size was mild to moderately dilated. Right Atrium: Right atrial size was normal in size. Pericardium: There is no evidence of pericardial effusion. Mitral Valve: The mitral valve is abnormal. There is mild thickening of the mitral valve leaflet(s). There is mild calcification of the mitral valve leaflet(s). Mild mitral annular calcification. Moderate mitral valve regurgitation. No evidence of mitral  valve stenosis. MV peak gradient, 8.1 mmHg. The mean mitral valve gradient is 4.0 mmHg. Tricuspid Valve: The tricuspid valve is abnormal.  Tricuspid valve regurgitation is mild to moderate. No evidence of tricuspid stenosis. Aortic Valve: The aortic valve was not well visualized. There is mild calcification of  the aortic valve. There is mild thickening of the aortic valve. There is mild aortic valve annular calcification. Aortic valve regurgitation is mild to moderate. Aortic regurgitation PHT measures 423 msec. No aortic stenosis is present. Aortic valve mean gradient measures 5.0 mmHg. Aortic valve peak gradient measures 8.9 mmHg. Aortic valve area, by VTI measures 2.44 cm. Pulmonic Valve: The pulmonic valve was not well visualized. Pulmonic valve regurgitation is not visualized. No evidence of pulmonic stenosis. Aorta: The aortic root is normal in size and structure. Venous: The inferior vena cava is dilated in size with greater than 50% respiratory variability, suggesting right atrial pressure of 8 mmHg. IAS/Shunts: No atrial level shunt detected by color flow Doppler.  LEFT VENTRICLE PLAX 2D LVIDd:         4.20 cm   Diastology LVIDs:         3.00 cm   LV e' medial:    5.55 cm/s LV PW:         0.90 cm   LV E/e' medial:  24.9 LV IVS:        0.90 cm   LV e' lateral:   9.68 cm/s LVOT diam:     2.20 cm   LV E/e' lateral: 14.3 LV SV:         73 LV SV Index:   38 LVOT Area:     3.80 cm  RIGHT VENTRICLE RV S prime:     11.10 cm/s TAPSE (M-mode): 1.9 cm LEFT ATRIUM             Index        RIGHT ATRIUM           Index LA diam:        3.50 cm 1.81 cm/m   RA Area:     14.40 cm LA Vol (A2C):   69.8 ml 36.06 ml/m  RA Volume:   31.80 ml  16.43 ml/m LA Vol (A4C):   91.9 ml 47.48 ml/m LA Biplane Vol: 80.8 ml 41.74 ml/m  AORTIC VALVE AV Area (Vmax):    2.47 cm AV Area (Vmean):   2.39 cm AV Area (VTI):     2.44 cm AV Vmax:           149.00 cm/s AV Vmean:          101.000 cm/s AV VTI:            0.301 m AV Peak Grad:      8.9 mmHg AV Mean Grad:      5.0 mmHg LVOT Vmax:         97.00 cm/s LVOT Vmean:        63.400 cm/s LVOT VTI:          0.193 m LVOT/AV  VTI ratio: 0.64 AI PHT:            423 msec  AORTA Ao Root diam: 3.30 cm MITRAL VALVE                 TRICUSPID VALVE MV Area (PHT): 4.80 cm      TR Peak grad:   42.8 mmHg MV Area VTI:   2.08 cm      TR Vmax:        327.00 cm/s MV Peak grad:  8.1 mmHg MV Mean grad:  4.0 mmHg      SHUNTS MV Vmax:       1.42 m/s      Systemic VTI:  0.19 m MV Vmean:  93.0 cm/s     Systemic Diam: 2.20 cm MV Decel Time: 158 msec MR Peak grad:   71.0 mmHg MR Mean grad:   56.0 mmHg MR Vmax:        421.40 cm/s MR Vmean:       352.0 cm/s MR PISA:        0.57 cm MR PISA Radius: 0.30 cm MV E velocity: 138.00 cm/s MV A velocity: 72.40 cm/s MV E/A ratio:  1.91 Carlyle Dolly MD Electronically signed by Carlyle Dolly MD Signature Date/Time: 11/13/2021/1:31:52 PM    Final     Assessment/Plan:   Principal Problem:   Symptomatic anemia   Patient Summary: Jack Fuentes is a 76 y.o.male with a past medical history of non-Hodgkin's lymphoma status post small bowel resection, 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  Patient is admitted for further work-up and treatment of symptomatic anemia.  #Symptomatic anemia #Iron deficiency anemia  Patient is status post 2 units of blood with hemoglobin up to 7.5, with repeat hemoglobin at 7.8.  This still seems to be not at goal.  GI consulted today, who states that they can follow-up outpatient, but nothing to do and patient given negative occult test.  They recommended CT scan as well as possible heme/onc consult.  On exam today patient did have shortness of breath with noticeable crackles at lower lung bases, but has not been requiring oxygen.  Folate normal.  B12 normal.  LDH normal.  Blood smear normal.  Given history of small bowel obstruction, there is concern for nutritional iron deficit.  Patient does report eating meats every day.  It could be absorption concern given patient had small bowel  resection.  This is likely severe iron deficiency anemia. -Noncontrast CT abdomen pending -Gentle diuresis with Lasix 20 mg -Iron deficit 1500 -Ferrlecit 250 mg started -Follow-up post transfusion hemoglobin -Continue to monitor respiratory status and oxygen duration -Trend hemoglobin every 8 hours   #Elevated troponins #Chronic systolic heart failure with EF of 45 to 50% -Questionable history of CAD and patient reports being on Plavix.  He states that he has stopped his Plavix 1 week ago given that his hemoglobin was low. Echo today showing normal left ventricular function with ejection fraction of 55 to 60%.  This is up from previous ejection fraction of 45 to 50% back in 2020.  Patient not have any chest pain and likely troponin elevated due to demand ischemia.  -Continue to monitor respiratory status   #Second-degree AV block (Mobitz type II) -EKG on admission showing second-degree AV block Mobitz type II.  Symptoms could be related to this.  Patient still having shortness of breath.  Echo today showing normal left ventricular function with ejection fraction of 55 to 60%.  This is up from previous ejection fraction of 45 to 50% back in 2020.  There is noted left atrial pressure.  This new arrhythmia could be etiology of shortness of breath as well as chronic fatigue, but given acute severe anemia, less likely the etiology.  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  This could be his new  baseline, but unsure given there is unknown baseline for the past 2 years.  Patient will receive another  unit of blood today, as well as some gentle diuresis given that patient is still having symptoms of anemia. -Continue to monitor BMP -This could be progression of chronic kidney disease versus acute AKI due to anemia -Decrease home Neurontin to 300 mg twice daily given decreased kidney function  Diet: Heart Healthy IVF: NS,100cc/hr VTE: SCDs Code: Full  Dispo: Anticipated discharge to Home in 2 days pending clinical improvement.   House Internal Medicine Resident PGY-1 [phone] Please contact the on call pager after 5 pm and on weekends at [phone].

## 2021-11-14 DIAGNOSIS — D649 Anemia, unspecified: Secondary | ICD-10-CM

## 2021-11-14 DIAGNOSIS — I5022 Chronic systolic (congestive) heart failure: Secondary | ICD-10-CM | POA: Diagnosis not present

## 2021-11-14 DIAGNOSIS — I25119 Atherosclerotic heart disease of native coronary artery with unspecified angina pectoris: Secondary | ICD-10-CM

## 2021-11-14 DIAGNOSIS — I441 Atrioventricular block, second degree: Secondary | ICD-10-CM | POA: Diagnosis not present

## 2021-11-14 LAB — BPAM RBC
Blood Product Expiration Date: 202310052359
Blood Product Expiration Date: 202310052359
Blood Product Expiration Date: 202310052359
ISSUE DATE / TIME: 202309081743
ISSUE DATE / TIME: 202309082220
ISSUE DATE / TIME: 202309091410
Unit Type and Rh: 6200
Unit Type and Rh: 6200
Unit Type and Rh: 6200

## 2021-11-14 LAB — BASIC METABOLIC PANEL
Anion gap: 8 (ref 5–15)
BUN: 35 mg/dL — ABNORMAL HIGH (ref 8–23)
CO2: 18 mmol/L — ABNORMAL LOW (ref 22–32)
Calcium: 8.4 mg/dL — ABNORMAL LOW (ref 8.9–10.3)
Chloride: 110 mmol/L (ref 98–111)
Creatinine, Ser: 2.16 mg/dL — ABNORMAL HIGH (ref 0.61–1.24)
GFR, Estimated: 31 mL/min — ABNORMAL LOW (ref >60)
Glucose, Bld: 88 mg/dL (ref 70–99)
Potassium: 4.2 mmol/L (ref 3.5–5.1)
Sodium: 136 mmol/L (ref 135–145)

## 2021-11-14 LAB — CBC WITH DIFFERENTIAL/PLATELET
Abs Immature Granulocytes: 0.02 10*3/uL (ref 0.00–0.07)
Basophils Absolute: 0 10*3/uL (ref 0.0–0.1)
Basophils Relative: 0 %
Eosinophils Absolute: 0.2 10*3/uL (ref 0.0–0.5)
Eosinophils Relative: 3 %
HCT: 27.6 % — ABNORMAL LOW (ref 39.0–52.0)
Hemoglobin: 8.2 g/dL — ABNORMAL LOW (ref 13.0–17.0)
Immature Granulocytes: 0 %
Lymphocytes Relative: 15 %
Lymphs Abs: 1.2 10*3/uL (ref 0.7–4.0)
MCH: 24.4 pg — ABNORMAL LOW (ref 26.0–34.0)
MCHC: 29.7 g/dL — ABNORMAL LOW (ref 30.0–36.0)
MCV: 82.1 fL (ref 80.0–100.0)
Monocytes Absolute: 1.1 10*3/uL — ABNORMAL HIGH (ref 0.1–1.0)
Monocytes Relative: 14 %
Neutro Abs: 5.4 10*3/uL (ref 1.7–7.7)
Neutrophils Relative %: 68 %
Platelets: 124 10*3/uL — ABNORMAL LOW (ref 150–400)
RBC: 3.36 MIL/uL — ABNORMAL LOW (ref 4.22–5.81)
RDW: 18.5 % — ABNORMAL HIGH (ref 11.5–15.5)
WBC: 7.9 10*3/uL (ref 4.0–10.5)
nRBC: 0.3 % — ABNORMAL HIGH (ref 0.0–0.2)

## 2021-11-14 LAB — TYPE AND SCREEN
ABO/RH(D): A POS
Antibody Screen: NEGATIVE
Unit division: 0
Unit division: 0
Unit division: 0

## 2021-11-14 NOTE — Progress Notes (Signed)
Subjective:   Hospital day: 2  Overnight event: CT results from yesterday showed inflammation around gallbladder, 9 mm right obstructive ureteral calculus and associated hydronephrosis, and small bowel enteritis.  Interim History: Patient evaluated at bedside laying comfortably in bed.  He continues to deny any shortness of breath, dizziness, chest pain, abdominal pain, nausea, vomiting, or urinary symptoms. He continues to feel some fatigue but this has also improved.  Objective:  Vital signs in last 24 hours: Vitals:   11/14/21 0107 11/14/21 0632 11/14/21 1247 11/14/21 1549  BP: 113/69 113/65 126/75   Pulse: 88 65    Resp: 18 19 (!) 22 20  Temp: 97.6 F (36.4 C) 98.1 F (36.7 C) 97.8 F (36.6 C) 97.7 F (36.5 C)  TempSrc: Oral Oral Oral Oral  SpO2: 93% 92%    Weight:      Height:        Filed Weights   11/12/21 2104  Weight: 79.8 kg     Intake/Output Summary (Last 24 hours) at 11/14/2021 1620 Last data filed at 11/14/2021 4235 Gross per 24 hour  Intake 1285.5 ml  Output 1460 ml  Net -174.5 ml   Net IO Since Admission: 426.5 mL [11/14/21 1620]  No results for input(s): "GLUCAP" in the last 72 hours.   Pertinent Labs:    Latest Ref Rng & Units 11/14/2021    4:08 AM 11/13/2021    8:09 PM 11/13/2021   10:26 AM  CBC  WBC 4.0 - 10.5 K/uL 7.9   9.8   Hemoglobin 13.0 - 17.0 g/dL 8.2  9.0  7.8   Hematocrit 39.0 - 52.0 % 27.6  30.3  26.6   Platelets 150 - 400 K/uL 124   132        Latest Ref Rng & Units 11/14/2021    4:08 AM 11/13/2021    2:32 AM 11/12/2021   12:59 PM  CMP  Glucose 70 - 99 mg/dL 88  95  103   BUN 8 - 23 mg/dL 35  36  34   Creatinine 0.61 - 1.24 mg/dL 2.16  2.29  2.44   Sodium 135 - 145 mmol/L 136  138  141   Potassium 3.5 - 5.1 mmol/L 4.2  4.9  5.0   Chloride 98 - 111 mmol/L 110  111  114   CO2 22 - 32 mmol/L '18  18  19   '$ Calcium 8.9 - 10.3 mg/dL 8.4  8.6  8.8   Total Protein 6.5 - 8.1 g/dL   6.2   Total Bilirubin 0.3 - 1.2 mg/dL   0.4    Alkaline Phos 38 - 126 U/L   50   AST 15 - 41 U/L   22   ALT 0 - 44 U/L   27     Imaging: CT ABDOMEN PELVIS WO CONTRAST  Addendum Date: 11/13/2021   ADDENDUM REPORT: 11/13/2021 20:42 ADDENDUM: These results were called by telephone at the time of interpretation on 11/13/2021 at 8:38 pm to provider Dr. Jodell Cipro , who verbally acknowledged these results. Electronically Signed   By: Ronney Asters M.D.   On: 11/13/2021 20:42   Result Date: 11/13/2021 CLINICAL DATA:  Concern for recurrence of non-Hodgkin's lymphoma. Patient is status post small bowel resection. EXAM: CT ABDOMEN AND PELVIS WITHOUT CONTRAST TECHNIQUE: Multidetector CT imaging of the abdomen and pelvis was performed following the standard protocol without IV contrast. RADIATION DOSE REDUCTION: This exam was performed according to the departmental dose-optimization program which includes automated  exposure control, adjustment of the mA and/or kV according to patient size and/or use of iterative reconstruction technique. COMPARISON:  CT abdomen and pelvis 06/29/2018 FINDINGS: Lower chest: There are moderate-sized bilateral pleural effusions with compressive atelectasis in the bilateral lower lobes. Hepatobiliary: There is diffuse gallbladder wall thickening with surrounding inflammation. No gallstones are seen. No biliary ductal dilatation. The liver is within normal limits. Pancreas: Unremarkable. No pancreatic ductal dilatation or surrounding inflammatory changes. Spleen: Normal in size without focal abnormality. Adrenals/Urinary Tract: Punctate calcifications in the right adrenal gland are unchanged likely related to prior infection or hemorrhage. Left adrenal gland is within normal limits. Severe left renal atrophy is unchanged. There are 2 calculi in the proximal left ureter. The more distal measures 9 mm in the more proximal measures 3 mm. There is resultant moderate/severe right-sided hydronephrosis. There are additional nonobstructing right  renal calculi measuring up to 6 mm. There is a 3 mm calculus in the bladder. The bladder is otherwise within normal limits. Stomach/Bowel: Patient is status post small bowel resection. Anastomosis is seen in the left mid abdomen. Small bowel loops just proximal to this level demonstrate wall thickening with mesenteric edema. There is no evidence for bowel obstruction or free air. No pneumatosis identified. The appendix is within normal limits. The stomach is within normal limits. Vascular/Lymphatic: Aortic atherosclerosis. No enlarged abdominal or pelvic lymph nodes. Reproductive: Prostate gland is enlarged, unchanged. Other: Fat containing inguinal hernias are again noted. There is a small amount of free fluid in the pelvis. There is mild body wall edema. Musculoskeletal: No acute or significant osseous findings. IMPRESSION: 1. Patient is status post small bowel resection. Small bowel loops proximal to the anastomosis demonstrate wall thickening and mesenteric edema worrisome for nonspecific enteritis including infectious, inflammatory and ischemic etiologies. 2. There are 2 calculi within the mid right ureter measuring up to 9 mm. There is moderate/severe right-sided hydronephrosis. 3. Gallbladder wall thickening with surrounding inflammation concerning for cholecystitis. 4. Small volume ascites. 5. Bladder calculi and right renal calculi. 6. Moderate-sized bilateral pleural effusions. 7.  Aortic Atherosclerosis (ICD10-I70.0). Electronically Signed: By: Ronney Asters M.D. On: 11/13/2021 20:17    Physical Exam  General: Pleasant, well-appearing elderly man laying in bed. No acute distress. HEENT: No lymphadenopathy. Pale conjunctiva. Mildly dry mucous membrane. CV: RRR. No m/r/g. No LE edema Pulmonary: Lungs CTAB. Normal effort. Distant crackles at the lung bases. Abdominal: Soft, nontender, nondistended. Normal bowel sounds. Extremities: 2+ distal pulses. Normal ROM. Skin: Warm and dry. No obvious rash  or lesions. Neuro: A&Ox3. Moves all extremities. Normal sensation to gross touch.  Psych: Normal mood and affect   Assessment/Plan: Jack Fuentes is a 76 y.o. 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  Principal Problem:   Symptomatic anemia  #Symptomatic anemia #Iron deficiency anemia  Continues to feel fatigue but this has improved since admission.  Continues to deny any shortness of breath, chest pain, dizziness or abdominal pain. Hemoglobin improved to 9.0 after 3 units PRBC with slight decrease to 8.2 this morning. Patient has no signs of active bleed. His anemia likely secondary to nutritional deficiency from poor absorption in the setting of his small bowel resection. However, we have not ruled out possible GI malignancy or bone marrow suppression. CT A/P did not show any evidence of intra abdominal bleed. Patient  denies any shortness of breath at rest but reports some dyspnea and fatigue on exertion. -Continue IV Ferrlecit 250 mg daily -Continue to hold Plavix -Trend CBC -Outpatient GI follow-up for colonoscopy  #Abnormal CT findings #Moderate pleural effusion #Hx of diffuse large B-cell lymphoma CT A/P yesterday showed findings concerning for nonspecific enteritis, mod-severe right-sided hydronephrosis, gallbladder wall thickening with surrounding inflammation and moderate-sized bilateral pleural effusions. His findings could likely be secondary to postoperative changes versus recurrence of his lymphoma. He has no abdominal or urinary symptoms including hematuria. Last PET/CT scan in May 2021 showed stable postoperative changes of abdominal mass and bowel resection, hepatic steatosis, atrophic left kidney with persistent moderate to severe hydronephrosis, nonobstructive  right-sided nephrolithiasis, 9 mm.  His new bilateral pleural effusion could likely be secondary to malignancy however patient has no B symptoms and his respiratory status remained stable. Will defer thoracentesis for now.  -Closely monitor for any new symptoms -Heme/Onc consult in the morning for outpatient evaluation -Outpatient urology follow-up  #Chronic systolic heart failure Last TTE in 2020 showed an EF 45-50% plan to repeat TTE on admission shows improvement in EF to 55-60%, G2DD, moderate PASP and RAP of 8 mmHg. Patient euvolemic to mildly dry on exam but does have some crackles at the bases of the lungs. He had a UOP about 1.5 L in the last 24 hours after 1 dose of IV Lasix 20 mg.  Kidney function slightly improved with creatinine down from 2.29-2.16. Respiratory status remained stable so we will continue to monitor closely and consider intermittent diuresing. -CTM -Strict I&O -Telemetry  #Second-degree AV block (Mobitz type II) EKG on admission showing second-degree AV block Mobitz type II.  Patient is thought to have some underlying conduction disease.  Heart rate has improved to the 60s and 70s with improvement in anemia. Continues to have runs of 2-1 AV block as well as Mobitz type II on telemetry but with less burden according to cardiology. He continues to denies any chest pain, dizziness, syncope. Still endorsing fatigue but states this is improving. -Cardiology following, appreciate recs -Pending evaluation by EP tomorrow for possible PPM -Continue telemetry -Monitor and replete electrolytes  #AKI on suspected CKD IV, unspecified Kidney function continues to improve with creatinine down to 2.16 from 2.29 yesterday and 2.44 on admission. Unclear baseline so we will likely establish a new baseline for patient at discharge.  He has been making appropriate urine output. -Trend BMP -Monitor urine output  History of CAD? Patient reports a history of abnormal stress test years ago  and calcifications found on CT scan prompting his cardiologist to start him on Plavix.  Denies any history of ACS or CVA. Holding Plavix in the setting of symptomatic anemia. 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 -Continue Neurontin to 300 mg twice daily   Diet: Heart Healthy IVF: N/A VTE: SCDs Code: Full  Prior to Admission Living Arrangement: Home Anticipated Discharge Location: Home Barriers to Discharge: Medical stability Dispo: Anticipated discharge in approximately 1-2 day(s).   Signed: Lacinda Axon, MD 11/14/2021, 4:20 PM  Pager: [phone] Internal Medicine Teaching Service After 5pm on weekdays and 1pm on weekends: On Call pager: [phone]

## 2021-11-14 NOTE — Progress Notes (Signed)
Notified of CT findings by Dr. Dagoberto Reef including inflammation around gallbladder, 9 mm right obstructive ureteral calculus and associated hydronephrosis, and small bowel enteritis.  Went to assess patient.  Patient denies any abdominal pain, nausea, vomiting, diarrhea, changes in stool at this time.  He denies any right flank pain.  He states that he has noticed any changes in his urination today including oliguria, changes in urinary quality.  He has had no changes in appetite and is tolerating p.o. intake at this time.  Exam: T98, RR 19, pulse 54, BP 118/65 General: pale, NAD Abdominal: Soft, nontender, nondistended.  Murphy negative.  No flank pain.  A/P: Gallbladder wall thickening, pericholecystic inflammation: Low suspicion of acute cholecystitis at this time with the lack of symptoms, exam findings, leukocytosis, fever.  Will CTM  Enteritis: Again, patient denies any symptoms concerning for infectious etiology of enteritis.  Will consider other etiologies.  9 mm right ureteral stone and hydronephrosis: Patient denies any symptoms at this time.  We will start tamsulosin and consider nonemergent urology follow-up.  Anemia: Posttransfusion Hgb 9

## 2021-11-14 NOTE — Progress Notes (Signed)
Mobility Specialist - Progress Note   11/14/21 1440  Mobility  Activity Ambulated with assistance in hallway  Level of Assistance Independent after set-up  Assistive Device None  Distance Ambulated (ft) 350 ft  Activity Response Tolerated fair  $Mobility charge 1 Mobility    Post-mobility:76  HR, 97%SpO2  Pt was received in bed and agreeable to mobility. Pt c/o SOB. Upon return to the room pt SpO2 was 96%. Pt was left EOB with all needs met.  Larey Seat

## 2021-11-15 ENCOUNTER — Inpatient Hospital Stay (HOSPITAL_COMMUNITY): Payer: Medicare Other | Admitting: Certified Registered Nurse Anesthetist

## 2021-11-15 ENCOUNTER — Other Ambulatory Visit: Payer: Self-pay | Admitting: Urology

## 2021-11-15 ENCOUNTER — Inpatient Hospital Stay (HOSPITAL_COMMUNITY): Payer: Medicare Other

## 2021-11-15 ENCOUNTER — Encounter (HOSPITAL_COMMUNITY): Payer: Self-pay | Admitting: Internal Medicine

## 2021-11-15 ENCOUNTER — Encounter (HOSPITAL_COMMUNITY)
Admission: EM | Disposition: A | Discharge status: Home / Self Care | Payer: Self-pay | Source: Home / Self Care | Attending: Internal Medicine

## 2021-11-15 DIAGNOSIS — I447 Left bundle-branch block, unspecified: Secondary | ICD-10-CM

## 2021-11-15 DIAGNOSIS — N281 Cyst of kidney, acquired: Secondary | ICD-10-CM | POA: Diagnosis not present

## 2021-11-15 DIAGNOSIS — I10 Essential (primary) hypertension: Secondary | ICD-10-CM

## 2021-11-15 DIAGNOSIS — N179 Acute kidney failure, unspecified: Secondary | ICD-10-CM

## 2021-11-15 DIAGNOSIS — D649 Anemia, unspecified: Secondary | ICD-10-CM | POA: Diagnosis not present

## 2021-11-15 DIAGNOSIS — D509 Iron deficiency anemia, unspecified: Secondary | ICD-10-CM | POA: Diagnosis not present

## 2021-11-15 DIAGNOSIS — N184 Chronic kidney disease, stage 4 (severe): Secondary | ICD-10-CM

## 2021-11-15 DIAGNOSIS — I441 Atrioventricular block, second degree: Secondary | ICD-10-CM

## 2021-11-15 HISTORY — PX: CYSTOSCOPY W/ URETERAL STENT PLACEMENT: SHX1429

## 2021-11-15 LAB — CBC
HCT: 27.6 % — ABNORMAL LOW (ref 39.0–52.0)
Hemoglobin: 8.4 g/dL — ABNORMAL LOW (ref 13.0–17.0)
MCH: 24.9 pg — ABNORMAL LOW (ref 26.0–34.0)
MCHC: 30.4 g/dL (ref 30.0–36.0)
MCV: 81.7 fL (ref 80.0–100.0)
Platelets: 124 10*3/uL — ABNORMAL LOW (ref 150–400)
RBC: 3.38 MIL/uL — ABNORMAL LOW (ref 4.22–5.81)
RDW: 19 % — ABNORMAL HIGH (ref 11.5–15.5)
WBC: 7.6 10*3/uL (ref 4.0–10.5)
nRBC: 0.3 % — ABNORMAL HIGH (ref 0.0–0.2)

## 2021-11-15 LAB — MAGNESIUM: Magnesium: 1.9 mg/dL (ref 1.7–2.4)

## 2021-11-15 LAB — BASIC METABOLIC PANEL
Anion gap: 6 (ref 5–15)
BUN: 31 mg/dL — ABNORMAL HIGH (ref 8–23)
CO2: 18 mmol/L — ABNORMAL LOW (ref 22–32)
Calcium: 8.5 mg/dL — ABNORMAL LOW (ref 8.9–10.3)
Chloride: 112 mmol/L — ABNORMAL HIGH (ref 98–111)
Creatinine, Ser: 2.02 mg/dL — ABNORMAL HIGH (ref 0.61–1.24)
GFR, Estimated: 34 mL/min — ABNORMAL LOW (ref >60)
Glucose, Bld: 86 mg/dL (ref 70–99)
Potassium: 4.5 mmol/L (ref 3.5–5.1)
Sodium: 136 mmol/L (ref 135–145)

## 2021-11-15 LAB — PROTIME-INR
INR: 1.5 — ABNORMAL HIGH (ref 0.8–1.2)
Prothrombin Time: 17.7 seconds — ABNORMAL HIGH (ref 11.4–15.2)

## 2021-11-15 LAB — HEPATIC FUNCTION PANEL
ALT: 21 U/L (ref 0–44)
AST: 20 U/L (ref 15–41)
Albumin: 3.1 g/dL — ABNORMAL LOW (ref 3.5–5.0)
Alkaline Phosphatase: 53 U/L (ref 38–126)
Bilirubin, Direct: 0.2 mg/dL (ref 0.0–0.2)
Indirect Bilirubin: 0.5 mg/dL (ref 0.3–0.9)
Total Bilirubin: 0.7 mg/dL (ref 0.3–1.2)
Total Protein: 6.1 g/dL — ABNORMAL LOW (ref 6.5–8.1)

## 2021-11-15 LAB — APTT: aPTT: 40 seconds — ABNORMAL HIGH (ref 24–36)

## 2021-11-15 LAB — TSH: TSH: 5.103 u[IU]/mL — ABNORMAL HIGH (ref 0.350–4.500)

## 2021-11-15 SURGERY — CYSTOSCOPY, WITH RETROGRADE PYELOGRAM AND URETERAL STENT INSERTION
Anesthesia: General | Laterality: Right

## 2021-11-15 MED ORDER — FUROSEMIDE 40 MG PO TABS
40.0000 mg | ORAL_TABLET | Freq: Every day | ORAL | Status: DC
  Administered 2021-11-15 – 2021-11-16 (×2): 40 mg via ORAL
  Filled 2021-11-15 (×2): qty 1

## 2021-11-15 MED ORDER — PROPOFOL 10 MG/ML IV BOLUS
INTRAVENOUS | Status: DC | PRN
  Administered 2021-11-15: 40 mg via INTRAVENOUS
  Administered 2021-11-15: 120 mg via INTRAVENOUS

## 2021-11-15 MED ORDER — NA FERRIC GLUC CPLX IN SUCROSE 12.5 MG/ML IV SOLN
250.0000 mg | Freq: Once | INTRAVENOUS | Status: AC
  Administered 2021-11-15: 250 mg via INTRAVENOUS
  Filled 2021-11-15: qty 250

## 2021-11-15 MED ORDER — ALBUTEROL SULFATE (2.5 MG/3ML) 0.083% IN NEBU: 2.5000 mg | INHALATION_SOLUTION | Freq: Once | RESPIRATORY_TRACT | Status: DC

## 2021-11-15 MED ORDER — ALBUTEROL SULFATE (2.5 MG/3ML) 0.083% IN NEBU
INHALATION_SOLUTION | RESPIRATORY_TRACT | Status: AC
  Filled 2021-11-15: qty 3

## 2021-11-15 MED ORDER — PHENYLEPHRINE 80 MCG/ML (10ML) SYRINGE FOR IV PUSH (FOR BLOOD PRESSURE SUPPORT)
PREFILLED_SYRINGE | INTRAVENOUS | Status: AC
  Filled 2021-11-15: qty 10

## 2021-11-15 MED ORDER — SODIUM CHLORIDE 0.9 % IV SOLN: INTRAVENOUS | Status: DC | PRN

## 2021-11-15 MED ORDER — ONDANSETRON HCL 4 MG/2ML IJ SOLN
INTRAMUSCULAR | Status: DC | PRN
  Administered 2021-11-15: 4 mg via INTRAVENOUS

## 2021-11-15 MED ORDER — EPHEDRINE SULFATE-NACL 50-0.9 MG/10ML-% IV SOSY
PREFILLED_SYRINGE | INTRAVENOUS | Status: DC | PRN
  Administered 2021-11-15 (×3): 12.5 mg via INTRAVENOUS

## 2021-11-15 MED ORDER — FENTANYL CITRATE (PF) 100 MCG/2ML IJ SOLN: 25.0000 ug | INTRAMUSCULAR | Status: DC | PRN

## 2021-11-15 MED ORDER — ONDANSETRON HCL 4 MG/2ML IJ SOLN
INTRAMUSCULAR | Status: AC
  Filled 2021-11-15: qty 2

## 2021-11-15 MED ORDER — FENTANYL CITRATE (PF) 250 MCG/5ML IJ SOLN
INTRAMUSCULAR | Status: AC
  Filled 2021-11-15: qty 5

## 2021-11-15 MED ORDER — SUCCINYLCHOLINE CHLORIDE 200 MG/10ML IV SOSY
PREFILLED_SYRINGE | INTRAVENOUS | Status: AC
  Filled 2021-11-15: qty 10

## 2021-11-15 MED ORDER — ACETAMINOPHEN 10 MG/ML IV SOLN
INTRAVENOUS | Status: AC
  Filled 2021-11-15: qty 100

## 2021-11-15 MED ORDER — CHLORHEXIDINE GLUCONATE 0.12 % MT SOLN
OROMUCOSAL | Status: AC
  Filled 2021-11-15: qty 15

## 2021-11-15 MED ORDER — CEFAZOLIN SODIUM-DEXTROSE 2-4 GM/100ML-% IV SOLN
INTRAVENOUS | Status: AC
  Filled 2021-11-15: qty 100

## 2021-11-15 MED ORDER — DEXAMETHASONE SODIUM PHOSPHATE 10 MG/ML IJ SOLN
INTRAMUSCULAR | Status: AC
  Filled 2021-11-15: qty 1

## 2021-11-15 MED ORDER — FENTANYL CITRATE (PF) 100 MCG/2ML IJ SOLN
INTRAMUSCULAR | Status: DC | PRN
  Administered 2021-11-15: 50 ug via INTRAVENOUS

## 2021-11-15 MED ORDER — MAGNESIUM SULFATE 2 GM/50ML IV SOLN
2.0000 g | Freq: Once | INTRAVENOUS | Status: AC
  Administered 2021-11-15: 2 g via INTRAVENOUS
  Filled 2021-11-15: qty 50

## 2021-11-15 MED ORDER — ACETAMINOPHEN 10 MG/ML IV SOLN
INTRAVENOUS | Status: DC | PRN
  Administered 2021-11-15: 1000 mg via INTRAVENOUS

## 2021-11-15 MED ORDER — LIDOCAINE 2% (20 MG/ML) 5 ML SYRINGE
INTRAMUSCULAR | Status: DC | PRN
  Administered 2021-11-15: 80 mg via INTRAVENOUS

## 2021-11-15 MED ORDER — LIDOCAINE HCL 2 % EX GEL
CUTANEOUS | Status: DC | PRN
  Administered 2021-11-15: 1

## 2021-11-15 MED ORDER — ROCURONIUM BROMIDE 10 MG/ML (PF) SYRINGE
PREFILLED_SYRINGE | INTRAVENOUS | Status: AC
  Filled 2021-11-15: qty 10

## 2021-11-15 MED ORDER — CEFAZOLIN SODIUM-DEXTROSE 2-4 GM/100ML-% IV SOLN
2.0000 g | INTRAVENOUS | Status: AC
  Administered 2021-11-15: 2 g via INTRAVENOUS
  Filled 2021-11-15: qty 100

## 2021-11-15 MED ORDER — DEXAMETHASONE SODIUM PHOSPHATE 10 MG/ML IJ SOLN
INTRAMUSCULAR | Status: DC | PRN
  Administered 2021-11-15: 5 mg via INTRAVENOUS

## 2021-11-15 MED ORDER — SUCCINYLCHOLINE CHLORIDE 200 MG/10ML IV SOSY
PREFILLED_SYRINGE | INTRAVENOUS | Status: DC | PRN
  Administered 2021-11-15: 120 mg via INTRAVENOUS

## 2021-11-15 MED ORDER — LIDOCAINE HCL URETHRAL/MUCOSAL 2 % EX GEL
CUTANEOUS | Status: AC
  Filled 2021-11-15: qty 11

## 2021-11-15 MED ORDER — EPHEDRINE 5 MG/ML INJ
INTRAVENOUS | Status: AC
  Filled 2021-11-15: qty 5

## 2021-11-15 MED ORDER — ONDANSETRON HCL 4 MG/2ML IJ SOLN: 4.0000 mg | Freq: Once | INTRAMUSCULAR | Status: DC | PRN

## 2021-11-15 MED ORDER — IOHEXOL 300 MG/ML  SOLN
INTRAMUSCULAR | Status: DC | PRN
  Administered 2021-11-15: 20 mL

## 2021-11-15 MED ORDER — STERILE WATER FOR IRRIGATION IR SOLN
Status: DC | PRN
  Administered 2021-11-15: 3000 mL

## 2021-11-15 SURGICAL SUPPLY — 25 items
BAG URINE DRAIN 2000ML AR STRL (UROLOGICAL SUPPLIES) ×1 IMPLANT
BAG URO CATCHER STRL LF (MISCELLANEOUS) ×1 IMPLANT
CATH BEACON 5 .035 65 KMP TIP (CATHETERS) IMPLANT
CATH FOLEY 2WAY SLVR  5CC 16FR (CATHETERS)
CATH FOLEY 2WAY SLVR 5CC 16FR (CATHETERS) IMPLANT
CATH INTERMIT  6FR 70CM (CATHETERS) IMPLANT
GLOVE SURG SS PI 8.0 STRL IVOR (GLOVE) ×1 IMPLANT
GOWN STRL REUS W/ TWL LRG LVL3 (GOWN DISPOSABLE) ×1 IMPLANT
GOWN STRL REUS W/ TWL XL LVL3 (GOWN DISPOSABLE) ×1 IMPLANT
GOWN STRL REUS W/TWL LRG LVL3 (GOWN DISPOSABLE) ×1
GOWN STRL REUS W/TWL XL LVL3 (GOWN DISPOSABLE) ×1
GUIDEWIRE ANG ZIPWIRE 038X150 (WIRE) IMPLANT
GUIDEWIRE STR DUAL SENSOR (WIRE) IMPLANT
KIT TURNOVER KIT B (KITS) ×1 IMPLANT
MANIFOLD NEPTUNE II (INSTRUMENTS) IMPLANT
NS IRRIG 1000ML POUR BTL (IV SOLUTION) ×1 IMPLANT
PACK CYSTO (CUSTOM PROCEDURE TRAY) ×1 IMPLANT
PAD ARMBOARD 7.5X6 YLW CONV (MISCELLANEOUS) ×1 IMPLANT
STENT URET 6FRX24 CONTOUR (STENTS) IMPLANT
STENT URET 6FRX26 CONTOUR (STENTS) IMPLANT
STENT URO INLAY 6FRX26CM (STENTS) IMPLANT
SYPHON OMNI JUG (MISCELLANEOUS) ×1 IMPLANT
TOWEL GREEN STERILE FF (TOWEL DISPOSABLE) ×1 IMPLANT
TUBE CONNECTING 12X1/4 (SUCTIONS) IMPLANT
WATER STERILE IRR 3000ML UROMA (IV SOLUTION) ×1 IMPLANT

## 2021-11-15 NOTE — Anesthesia Preprocedure Evaluation (Signed)
Anesthesia Evaluation  Patient identified by MRN, date of birth, ID band Patient awake    Reviewed: Allergy & Precautions, NPO status , Patient's Chart, lab work & pertinent test results  Airway Mallampati: II  TM Distance: >3 FB Neck ROM: Full    Dental  (+) Dental Advisory Given, Implants   Pulmonary neg pulmonary ROS,    Pulmonary exam normal breath sounds clear to auscultation       Cardiovascular hypertension, Pt. on medications Normal cardiovascular exam+ dysrhythmias (LBBB)  Rhythm:Regular Rate:Normal     Neuro/Psych  Neuromuscular disease    GI/Hepatic negative GI ROS, Neg liver ROS,   Endo/Other  negative endocrine ROS  Renal/GU Renal disease (AKI)     Musculoskeletal negative musculoskeletal ROS (+)   Abdominal   Peds  Hematology  (+) Blood dyscrasia (Plavix), anemia , Lymphoma    Anesthesia Other Findings Day of surgery medications reviewed with the patient.  Reproductive/Obstetrics                             Anesthesia Physical Anesthesia Plan  ASA: 3 and emergent  Anesthesia Plan: General   Post-op Pain Management: Ofirmev IV (intra-op)*   Induction: Intravenous, Rapid sequence and Cricoid pressure planned  PONV Risk Score and Plan: 2 and Dexamethasone and Ondansetron  Airway Management Planned: Oral ETT  Additional Equipment:   Intra-op Plan:   Post-operative Plan: Extubation in OR  Informed Consent: I have reviewed the patients History and Physical, chart, labs and discussed the procedure including the risks, benefits and alternatives for the proposed anesthesia with the patient or authorized representative who has indicated his/her understanding and acceptance.     Dental advisory given  Plan Discussed with: CRNA  Anesthesia Plan Comments:         Anesthesia Quick Evaluation

## 2021-11-15 NOTE — Consult Note (Addendum)
Cardiology Consultation   Patient ID: Jack Fuentes MRN: [id number]; DOB: February 02, 1946  Admit date: 11/12/2021 Date of Consult: 11/15/2021  PCP:  Med[facility], [place] Prov[facility] Cardiologist:  None        Patient Profile:   Jack Fuentes is a 76 y.o. male with a hx of NHL w/hx of SBO and resection, has had chemo (2001), HLD who is being seen 11/15/2021 for the evaluation of advanced heart block at the request of Dr. Johney Frame.  History of Present Illness:   Jack Fuentes saught attention 2/2 a few days of unusual/progressive fatigue, had labs recently done and when he looked up the results sow he was quite anemic. He was admitted by medicine, noted that he has long hx of anemia with similar numbers in the past. Noted as well with AKI Planned to transfuse and w/u further. Cardiology was consulted for heart block and abnormal Trops  Cardiology tracked down hx of being remotely seen by [facility] clinic for a cardiac work-up. He reports he underwent an exercise nuclear stress test where he was told he had an "inferior scar" with evidence of a "prior heart attack." He was placed on plavix at that time. He denies any history of prior cath or PCI. TTE in 2020 showed LVEF 45-50% with global hypokinesis (although limited visualization), normal RV, mild AR, GLS-11.9%.   Planned to update his echo and follow with treatment of his anemia Started on lasix for some volume OL. BP stable Tele over the weekend with SR and 2:1 block with plans for EP to see  IM has low suspicion of active bleeding, likely IDA w/poor absorption He did have an abnormal CT abdomen (noted below)  LABS K+ 4.5 BUN/Creat 34/2.44 >>> 31/2.02 (baseline from 2 years ago 0.9) Mag 1.9 Hgb 6.2 > 7.5 > 7.8 > 9 . 8.2 > 8.4 Hct 23 >>> 27 Plts 134, 124  He reports having labs done Aug 15th outpatient, apparently for routine purposes. He did not start to notice symptoms for a  couple weeks though after that. At baseline he is typically pretty tired in the afternoons, gets his ADLs done in the mornings. But of late the degree of fatigue is more and his exertional capacity even in the AMs is poor, getting weak, tired, SOB with exertion, sometimes dizzy and lightheaded as well, having to sit down to feel better. Because of these symptoms he decided to look for his lab results and noticed that his Hg was in the 5s (in August)  and came in.  No CP No rest SOB or symptoms    Past Medical History:  Diagnosis Date   Cancer ([place])    lymphoma-large B-cell   History of left bundle branch block (LBBB)    Hyperlipidemia    Peripheral neuropathy    SBO (small bowel obstruction) ([place]) 06/2018    Past Surgical History:  Procedure Laterality Date   EYE SURGERY     IR PATIENT EVAL TECH 0-60 MINS  08/01/2018   LAPAROTOMY N/A 07/03/2018   Procedure: EXPLORATORY LAPAROTOMY, LYSIS OF ADHESION, BOWEL RESECTION, RESECTION MESTASTATIC TUMOR;  Surgeon: Jovita Kussmaul, MD;  Location: [place];  Service: General;  Laterality: N/A;   NASAL FRACTURE SURGERY       Home Medications:  Prior to Admission medications   Medication Sig Start Date End Date Taking? Authorizing Provider  acetaminophen (TYLENOL) 325 MG tablet Take 2 tablets (650 mg total) by mouth every  6 (six) hours. Patient taking differently: Take 650 mg by mouth every 6 (six) hours as needed for moderate pain. 07/13/18  Yes Isabelle Course, MD  gabapentin (NEURONTIN) 600 MG tablet Take 600 mg by mouth 2 (two) times daily.  04/18/18  Yes [provider]  Multiple Vitamin (MULTIVITAMIN WITH MINERALS) TABS tablet Take 1 tablet by mouth daily. 07/14/18  Yes Isabelle Course, MD  Turmeric 500 MG CAPS Take 500 mg by mouth 2 (two) times daily.   Yes [provider]  valsartan (DIOVAN) 40 MG tablet Take 40 mg by mouth daily as needed (For blood pressure). 10/15/21  Yes [provider]  clopidogrel (PLAVIX) 75 MG  tablet Take 75 mg by mouth daily. Patient not taking: Reported on 11/12/2021 04/18/18   [provider]  methocarbamol (ROBAXIN) 500 MG tablet Take 1 tablet (500 mg total) by mouth every 8 (eight) hours as needed for muscle spasms. Patient not taking: Reported on 11/12/2021 07/13/18   Isabelle Course, MD  rosuvastatin (CRESTOR) 10 MG tablet Take 10 mg by mouth daily. Patient not taking: Reported on 11/12/2021 04/18/18   [provider]  saccharomyces boulardii (DAILY PROBIOTIC SUPPLEMENT) 250 MG capsule Take 250 mg by mouth 2 (two) times daily. FOR SUPPLEMENT(DO NOT CRUSH) Patient not taking: Reported on 11/12/2021    [provider]    Inpatient Medications: Scheduled Meds:  gabapentin  300 mg Oral BID   multivitamin with minerals  1 tablet Oral Daily   tamsulosin  0.4 mg Oral Daily   Continuous Infusions:  ferric gluconate (FERRLECIT) IVPB     magnesium sulfate bolus IVPB 2 g (11/15/21 0856)   PRN Meds: acetaminophen **OR** acetaminophen, ondansetron **OR** ondansetron (ZOFRAN) IV, mouth rinse, senna-docusate  Allergies:    Allergies  Allergen Reactions   Levofloxacin Swelling   Midazolam Swelling    Social History:   Social History   Socioeconomic History   Marital status: Divorced    Spouse name: Not on file   Number of children: Not on file   Years of education: Not on file   Highest education level: Not on file  Occupational History   Not on file  Tobacco Use   Smoking status: Never   Smokeless tobacco: Never  Vaping Use   Vaping Use: Never used  Substance and Sexual Activity   Alcohol use: Not Currently   Drug use: Never   Sexual activity: Not on file  Other Topics Concern   Not on file  Social History Narrative   Not on file   Social Determinants of Health   Financial Resource Strain: Not on file  Food Insecurity: Not on file  Transportation Needs: Not on file  Physical Activity: Not on file  Stress: Not on file  Social Connections:  Not on file  Intimate Partner Violence: Not on file    Family History:   Family History  Problem Relation Age of Onset   Heart disease Father    Multiple myeloma Sister      ROS:  Please see the history of present illness.  All other ROS reviewed and negative.     Physical Exam/Data:   Vitals:   11/14/21 1549 11/14/21 2130 11/15/21 0006 11/15/21 0454  BP:  117/64 123/72 117/75  Pulse:  91 78 72  Resp: '20 20 19 20  ' Temp: 97.7 F (36.5 C) 98 F (36.7 C) 98 F (36.7 C) 98 F (36.7 C)  TempSrc: Oral Oral Oral Oral  SpO2:  94%  94%  Weight:      Height:       No intake or output data in the 24 hours ending 11/15/21 0943    11/12/2021    9:04 PM 08/08/2018    2:23 PM 08/06/2018   11:33 AM  Last 3 Weights  Weight (lbs) 176 lb 145 lb 14.4 oz 145 lb  Weight (kg) 79.833 kg 66.18 kg 65.772 kg     Body mass index is 26.76 kg/m.  General:  Well nourished, well developed, in no acute distress HEENT: normal Neck: no JVD Vascular: No carotid bruits; Distal pulses 2+ bilaterally Cardiac: RRR; no murmurs gallops or rubs Lungs:  soft crackles at the bases L>R, no wheezing, rhonchi or rales  Abd: soft, nontender Ext: no edema Musculoskeletal:  No deformities Skin: warm and dry  Neuro:  No  grossfocal abnormalities noted Psych:  Normal affect   EKG:  The EKG was personally reviewed and demonstrates:    SB 58bpm, with Mobitz II block > 2:1 confuction, , QRS 114, LAD, icRBBB SR 69bpm 1:1 conduction, icRBBB, LAD  OLD SR 88bpm, RBBB (169m), LAD  Telemetry:  Telemetry was personally reviewed and demonstrates:   SR with intermittent/regularly 2:1 conduction though A rates are faster and V rates are OK 40's-70's  Relevant CV Studies:  11/13/21: TTE 1. Left ventricular ejection fraction, by estimation, is 55 to 60%. The  left ventricle has normal function. The left ventricle has no regional  wall motion abnormalities. Left ventricular diastolic parameters are  consistent with  Grade II diastolic  dysfunction (pseudonormalization). Elevated left atrial pressure.   2. Right ventricular systolic function is normal. The right ventricular  size is normal. There is moderately elevated pulmonary artery systolic  pressure.   3. Left atrial size was mild to moderately dilated.   4. The mitral valve is abnormal. Moderate mitral valve regurgitation. No  evidence of mitral stenosis.   5. The tricuspid valve is abnormal. Tricuspid valve regurgitation is mild  to moderate.   6. The aortic valve was not well visualized. There is mild calcification  of the aortic valve. There is mild thickening of the aortic valve. Aortic  valve regurgitation is mild to moderate. No aortic stenosis is present.   7. The inferior vena cava is dilated in size with >50% respiratory  variability, suggesting right atrial pressure of 8 mmHg.   Laboratory Data:  High Sensitivity Troponin:   Recent Labs  Lab 11/12/21 1259 11/12/21 1623  TROPONINIHS 23* 21*     Chemistry Recent Labs  Lab 11/13/21 0232 11/14/21 0408 11/15/21 0323  NA 138 136 136  K 4.9 4.2 4.5  CL 111 110 112*  CO2 18* 18* 18*  GLUCOSE 95 88 86  BUN 36* 35* 31*  CREATININE 2.29* 2.16* 2.02*  CALCIUM 8.6* 8.4* 8.5*  MG  --   --  1.9  GFRNONAA 29* 31* 34*  ANIONGAP '9 8 6    ' Recent Labs  Lab 11/12/21 1259  PROT 6.2*  ALBUMIN 3.3*  AST 22  ALT 27  ALKPHOS 50  BILITOT 0.4   Lipids No results for input(s): "CHOL", "TRIG", "HDL", "LABVLDL", "LDLCALC", "CHOLHDL" in the last 168 hours.  Hematology Recent Labs  Lab 11/13/21 1026 11/13/21 2009 11/14/21 0408 11/15/21 0323  WBC 9.8  --  7.9 7.6  RBC 3.23*  --  3.36* 3.38*  HGB 7.8* 9.0* 8.2* 8.4*  HCT 26.6* 30.3* 27.6* 27.6*  MCV 82.4  --  82.1 81.7  MCH 24.1*  --  24.4* 24.9*  MCHC 29.3*  --  29.7* 30.4  RDW 18.7*  --  18.5* 19.0*  PLT 132*  --  124* 124*   Thyroid No results for input(s): "TSH", "FREET4" in the last 168 hours.  BNPNo results for input(s):  "BNP", "PROBNP" in the last 168 hours.  DDimer No results for input(s): "DDIMER" in the last 168 hours.   Radiology/Studies:  CT ABDOMEN PELVIS WO CONTRAST Addendum Date: 11/13/2021   ADDENDUM REPORT: 11/13/2021 20:42 ADDENDUM: These results were called by telephone at the time of interpretation on 11/13/2021 at 8:38 pm to provider Dr. Jodell Cipro , who verbally acknowledged these results. Electronically Signed   By: Ronney Asters M.D.   On: 11/13/2021 20:42   Result Date: 11/13/2021 CLINICAL DATA:  Concern for recurrence of non-Hodgkin's lymphoma. Patient is status post small bowel resection. EXAM: CT ABDOMEN AND PELVIS WITHOUT CONTRAST TECHNIQUE: Multidetector CT imaging of the abdomen and pelvis was performed following the standard protocol without IV contrast. RADIATION DOSE REDUCTION: This exam was performed according to the departmental dose-optimization program which includes automated exposure control, adjustment of the mA and/or kV according to patient size and/or use of iterative reconstruction technique. COMPARISON:  CT abdomen and pelvis 06/29/2018 FINDINGS: Lower chest: There are moderate-sized bilateral pleural effusions with compressive atelectasis in the bilateral lower lobes. Hepatobiliary: There is diffuse gallbladder wall thickening with surrounding inflammation. No gallstones are seen. No biliary ductal dilatation. The liver is within normal limits. Pancreas: Unremarkable. No pancreatic ductal dilatation or surrounding inflammatory changes. Spleen: Normal in size without focal abnormality. Adrenals/Urinary Tract: Punctate calcifications in the right adrenal gland are unchanged likely related to prior infection or hemorrhage. Left adrenal gland is within normal limits. Severe left renal atrophy is unchanged. There are 2 calculi in the proximal left ureter. The more distal measures 9 mm in the more proximal measures 3 mm. There is resultant moderate/severe right-sided hydronephrosis. There are  additional nonobstructing right renal calculi measuring up to 6 mm. There is a 3 mm calculus in the bladder. The bladder is otherwise within normal limits. Stomach/Bowel: Patient is status post small bowel resection. Anastomosis is seen in the left mid abdomen. Small bowel loops just proximal to this level demonstrate wall thickening with mesenteric edema. There is no evidence for bowel obstruction or free air. No pneumatosis identified. The appendix is within normal limits. The stomach is within normal limits. Vascular/Lymphatic: Aortic atherosclerosis. No enlarged abdominal or pelvic lymph nodes. Reproductive: Prostate gland is enlarged, unchanged. Other: Fat containing inguinal hernias are again noted. There is a small amount of free fluid in the pelvis. There is mild body wall edema. Musculoskeletal: No acute or significant osseous findings. IMPRESSION: 1. Patient is status post small bowel resection. Small bowel loops proximal to the anastomosis demonstrate wall thickening and mesenteric edema worrisome for nonspecific enteritis including infectious, inflammatory and ischemic etiologies. 2. There are 2 calculi within the mid right ureter measuring up to 9 mm. There is moderate/severe right-sided hydronephrosis. 3. Gallbladder wall thickening with surrounding inflammation concerning for cholecystitis. 4. Small volume ascites. 5. Bladder calculi and right renal calculi. 6. Moderate-sized bilateral pleural effusions. 7.  Aortic Atherosclerosis (ICD10-I70.0). Electronically Signed: By: Ronney Asters M.D. On: 11/13/2021 20:17      Assessment and Plan:   Advanced heart block, Mobitz II 2:1 Baseline conduction system disease RBBB, LAD His problem list mentions LBBB, I will look further in his chart to try and clarify this No reversible causes  There is no TSH, though low suspicion of this  Lytes are OK No nodal blocking agents   3. Pleural effusions Ascites Got some lasix, planned for PRN  dosing LV is OK Cards is following No plans for paracentesis    IM is managing  He is HD stable with good BP and no significant bradycardia unclear if there is concern for an acute intra-abdominal process or not? CT abd abnormal He denies any belly pain, abd is soft  Acute anemia (Most recent Hgb I find is May 2021 was 13.8) Plavix on hold   Risk Assessment/Risk Scores:    For questions or updates, please contact [place] Please consult [email or web address] for contact info under    Signed, Baldwin Jamaica, PA-C  11/15/2021 9:43 AM

## 2021-11-15 NOTE — Transfer of Care (Signed)
Immediate Anesthesia Transfer of Care Note  Patient: Jack Fuentes  Procedure(s) Performed: CYSTOSCOPY WITH RETROGRADE PYELOGRAM (Right)  Patient Location: PACU  Anesthesia Type:General  Level of Consciousness: awake and oriented  Airway & Oxygen Therapy: Patient Spontanous Breathing and non-rebreather face mask  Post-op Assessment: Report given to RN  Post vital signs: Reviewed  Last Vitals:  Vitals Value Taken Time  BP 106/60 11/15/21 1751  Temp    Pulse 65 11/15/21 1757  Resp 28 11/15/21 1757  SpO2 93 % 11/15/21 1757  Vitals shown include unvalidated device data.  Last Pain:  Vitals:   11/15/21 1617  TempSrc: Oral  PainSc: 0-No pain      Patients Stated Pain Goal: 0 (66/29/47 6546)  Complications: No notable events documented.

## 2021-11-15 NOTE — Progress Notes (Addendum)
Subjective:   Hospital day: 3  Overnight event: N/A  Patient reports that he is still getting short of breath. Patient notes that he is getting short of breath with walking but also with laying down. He denies any leg swelling , chest pain, diaphoresis, or feeling weak. He notes that he is eating and drinking okay and has not had any changes in his bowel or bladder habits.   Objective:  Vital signs in last 24 hours: Vitals:   11/14/21 2130 11/15/21 0006 11/15/21 0454 11/15/21 1253  BP: 117/64 123/72 117/75 122/60  Pulse: 91 78 72 67  Resp: '20 19 20 18  '$ Temp: 98 F (36.7 C) 98 F (36.7 C) 98 F (36.7 C) 98 F (36.7 C)  TempSrc: Oral Oral Oral Oral  SpO2: 94%  94% 97%  Weight:      Height:        Filed Weights   11/12/21 2104  Weight: 79.8 kg    No intake or output data in the 24 hours ending 11/15/21 1408  Net IO Since Admission: 426.5 mL [11/15/21 1408]  No results for input(s): "GLUCAP" in the last 72 hours.   Pertinent Labs:    Latest Ref Rng & Units 11/15/2021    3:23 AM 11/14/2021    4:08 AM 11/13/2021    8:09 PM  CBC  WBC 4.0 - 10.5 K/uL 7.6  7.9    Hemoglobin 13.0 - 17.0 g/dL 8.4  8.2  9.0   Hematocrit 39.0 - 52.0 % 27.6  27.6  30.3   Platelets 150 - 400 K/uL 124  124         Latest Ref Rng & Units 11/15/2021    3:23 AM 11/14/2021    4:08 AM 11/13/2021    2:32 AM  CMP  Glucose 70 - 99 mg/dL 86  88  95   BUN 8 - 23 mg/dL 31  35  36   Creatinine 0.61 - 1.24 mg/dL 2.02  2.16  2.29   Sodium 135 - 145 mmol/L 136  136  138   Potassium 3.5 - 5.1 mmol/L 4.5  4.2  4.9   Chloride 98 - 111 mmol/L 112  110  111   CO2 22 - 32 mmol/L '18  18  18   '$ Calcium 8.9 - 10.3 mg/dL 8.5  8.4  8.6     Imaging: No results found.  Physical Exam  General: Pleasant, well-appearing elderly man laying in bed. No acute distress. HEENT: No lymphadenopathy. Pale conjunctiva CV: RRR. No m/r/g. No LE edema Pulmonary: Normal effort. Distant crackles at the lung  bases. Abdominal: Soft, nontender, nondistended. Normal bowel sounds. Extremities: 2+ pedal pulses.  1-2+ edema noted to bilateral lower extremities Skin: Warm and dry. No obvious rash or lesions.  Assessment/Plan: Jack Fuentes is a 76 y.o. 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  Principal Problem:   Symptomatic anemia Active Problems:   Acute renal failure superimposed on stage 4 chronic kidney disease (HCC)   AV block, Mobitz 2  #Symptomatic anemia #Iron deficiency anemia  Continues to feel fatigue but this has improved since admission.  Patient is still endorsing shortness of breath with rest and exertion.  Denies any chest pain, leg swelling, lightheadedness, or dizziness. Hemoglobin improved to 9.0 after 3 units PRBC and is  stable at 8.4 this morning.. Patient has no signs of active bleed. His anemia likely secondary to nutritional deficiency from poor absorption in the setting of his small bowel resection. However, we have not ruled out possible GI malignancy or bone marrow suppression. CT A/P did not show any evidence of intra abdominal bleed.  Given that hemoglobin is stable, and patient still having shortness of breath, this could be symptomatic anemia, versus related to his pleural effusion.  Of note, after further research, turmeric can contribute to decreased iron absorption.  This could be contributing to his anemia, as the patient takes turmeric daily. -Continue IV Ferrlecit 250 mg daily (this is day 3) -Avoid turmeric -Continue to hold Plavix -Trend CBC -Outpatient GI follow-up for colonoscopy  #Abnormal CT findings #Moderate pleural effusion #Hx of diffuse large B-cell lymphoma CT A/P 11/13/2021 showed findings concerning for nonspecific enteritis, mod-severe  right-sided hydronephrosis, gallbladder wall thickening with surrounding inflammation and moderate-sized bilateral pleural effusions. His findings could likely be secondary to postoperative changes versus recurrence of his lymphoma. He has no abdominal or urinary symptoms including hematuria. Last PET/CT scan in May 2021 showed stable postoperative changes of abdominal mass and bowel resection, hepatic steatosis, atrophic left kidney with persistent moderate to severe hydronephrosis, nonobstructive right-sided nephrolithiasis, 9 mm.  His new bilateral pleural effusion could likely be secondary to malignancy however patient has no B symptoms and his respiratory status remained stable -Heme-onc consult this morning, stating that they will see the patient outpatient -Closely monitor for any new symptoms -Continue to diurese patient with 40 mg Lasix p.o. daily  #AKI on suspected CKD IV, unspecified #Right kidney hydronephrosis CT abdomen pelvis on 11/13/2021 showing moderate/severe right-sided hydronephrosis.  Kidney function continues to improve with creatinine down to 2.02 from 2.16 yesterday and 2.44 on admission. Unclear baseline so we will likely establish a new baseline for patient at discharge.  He has been making appropriate urine output. -Urology consulted, who states that they will stent the patient's right ureter -Trend BMP -Monitor urine output  #Chronic systolic heart failure Last TTE in 2020 showed an EF 45-50% plan to repeat TTE on admission shows improvement in EF to 55-60%, G2DD, moderate PASP and RAP of 8 mmHg. He had a UOP about 2L in the last 24 hours after 1 dose of IV Lasix 20 mg.  Kidney function slightly improved with creatinine down from 2.16 to 2.02.  Respiratory status worsened today and edema increased, will diurese with 40 mg Lasix p.o. -Diuresis with 40 mg p.o. Lasix today -CTM -Strict I&O -Telemetry  #Second-degree AV block (Mobitz type II) EKG on admission showing  second-degree AV block Mobitz type II.  Patient is thought to have some underlying conduction disease.  Heart rate has improved to the 60s and 70s with improvement in anemia. Continues to have runs of 2-1 AV block as well as Mobitz type II on telemetry but with less burden according to cardiology. He continues to deny any chest pain, dizziness, syncope. Still endorsing fatigue but states this is improving. -Cardiology following, appreciate recs -Pending EP eval, appreciate recs -Continue telemetry -Monitor and replete electrolytes  History of CAD? Patient reports a history of abnormal stress test years ago and calcifications found on CT scan prompting his cardiologist to start him on Plavix.  Denies any history of ACS or CVA. Holding Plavix in the setting of symptomatic anemia. 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 -Continue Neurontin to 300 mg  twice daily   Diet: Heart Healthy IVF: N/A VTE: SCDs Code: Full  Prior to Admission Living Arrangement: Home Anticipated Discharge Location: Home Barriers to Discharge: Medical stability Dispo: Anticipated discharge in approximately 1-2 day(s).   SignedLeigh Aurora, DO 11/15/2021, 2:08 PM  Pager: [phone] Internal Medicine Teaching Service After 5pm on weekdays and 1pm on weekends: On Call pager: [phone]

## 2021-11-15 NOTE — Consult Note (Addendum)
Urology Consult   Physician requesting consult: Dr Leigh Aurora  Reason for consult: R hydronephrosis  History of Present Illness: Jack Fuentes is a 76 y.o. who was admitted to [facility] c/o of shortness of breath and fatigue and found to have a anemia and a second degree heart block. A CT scan was obtained and showed new R hydronephrosis.   I reviewed [name] CT scan.  It shows mild/moderate hydronephrosis tracking down to 2 proximal ureteral calcifications the largest of which is around 6 or 7 mm.  The left kidney is present but severely atrophic and likely nonfunctioning  Cr today 2.02, yesterday 2.16; no white count and UA 9/8 unremarkable. UOP overnight was 1.46 L.  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 At some point he describes what sounds like a renal scan that showed 2% function.  Past Medical History:  Diagnosis Date   Cancer ([place])    lymphoma-large B-cell   History of left bundle branch block (LBBB)    Hyperlipidemia    Peripheral neuropathy    SBO (small bowel obstruction) ([place]) 06/2018    Past Surgical History:  Procedure Laterality Date   EYE SURGERY     IR PATIENT EVAL TECH 0-60 MINS  08/01/2018   LAPAROTOMY N/A 07/03/2018   Procedure: EXPLORATORY LAPAROTOMY, LYSIS OF ADHESION, BOWEL RESECTION, RESECTION MESTASTATIC TUMOR;  Surgeon: Jovita Kussmaul, MD;  Location: [place];  Service: General;  Laterality: N/A;   [facility] Medications:  Home Meds:  No current facility-administered medications on file prior to encounter.   Current Outpatient Medications on File Prior to Encounter  Medication Sig Dispense Refill   acetaminophen (TYLENOL) 325 MG tablet Take 2 tablets (650 mg total) by mouth every 6 (six) hours. (Patient taking differently: Take 650 mg by mouth every 6 (six) hours as needed for moderate pain.)     gabapentin (NEURONTIN) 600 MG tablet Take 600 mg by mouth 2 (two)  times daily.      Multiple Vitamin (MULTIVITAMIN WITH MINERALS) TABS tablet Take 1 tablet by mouth daily.     Turmeric 500 MG CAPS Take 500 mg by mouth 2 (two) times daily.     valsartan (DIOVAN) 40 MG tablet Take 40 mg by mouth daily as needed (For blood pressure).     clopidogrel (PLAVIX) 75 MG tablet Take 75 mg by mouth daily. (Patient not taking: Reported on 11/12/2021)     methocarbamol (ROBAXIN) 500 MG tablet Take 1 tablet (500 mg total) by mouth every 8 (eight) hours as needed for muscle spasms. (Patient not taking: Reported on 11/12/2021)     rosuvastatin (CRESTOR) 10 MG tablet Take 10 mg by mouth daily. (Patient not taking: Reported on 11/12/2021)     saccharomyces boulardii (DAILY PROBIOTIC SUPPLEMENT) 250 MG capsule Take 250 mg by mouth 2 (two) times daily. FOR SUPPLEMENT(DO NOT CRUSH) (Patient not taking: Reported on 11/12/2021)       Scheduled Meds:  chlorhexidine       furosemide  40 mg Oral Daily   gabapentin  300 mg Oral BID   multivitamin with minerals  1 tablet Oral Daily   tamsulosin  0.4 mg Oral Daily   Continuous Infusions:  ceFAZolin     PRN Meds:.acetaminophen **OR** acetaminophen, ceFAZolin, chlorhexidine, ondansetron **OR** ondansetron (ZOFRAN) IV, mouth rinse, senna-docusate  Allergies:  Allergies  Allergen Reactions   Levofloxacin Swelling   Midazolam Swelling  Family History  Problem Relation Age of Onset   Heart disease Father    Multiple myeloma Sister     Social History:  reports that he has never smoked. He has never used smokeless tobacco. He reports that he does not currently use alcohol. He reports that he does not use drugs.  ROS: A complete review of systems was performed.  All systems are negative except for pertinent findings as noted.  Physical Exam:  Vital signs in last 24 hours: Temp:  [97.7 F (36.5 C)-98 F (36.7 C)] 98 F (36.7 C) (09/11 1253) Pulse Rate:  [67-91] 67 (09/11 1253) Resp:  [18-20] 18 (09/11 1253) BP:  (117-123)/(60-75) 122/60 (09/11 1253) SpO2:  [94 %-97 %] 97 % (09/11 1253) Constitutional:  Alert and oriented, No acute distress Cardiovascular: Regular rate and rhythm Respiratory: Normal respiratory effort, Lungs clear bilaterally GI: Abdomen is soft, nontender, nondistended, no abdominal masses GU: No CVA tenderness Neurologic: Grossly intact, no focal deficits Psychiatric: Normal mood and affect  Laboratory Data:  Recent Labs    11/13/21 0232 11/13/21 1026 11/13/21 2009 11/14/21 0408 11/15/21 0323  WBC 8.0 9.8  --  7.9 7.6  HGB 7.5* 7.8* 9.0* 8.2* 8.4*  HCT 25.5* 26.6* 30.3* 27.6* 27.6*  PLT 124* 132*  --  124* 124*    Recent Labs    11/13/21 0232 11/14/21 0408 11/15/21 0323  NA 138 136 136  K 4.9 4.2 4.5  CL 111 110 112*  GLUCOSE 95 88 86  BUN 36* 35* 31*  CALCIUM 8.6* 8.4* 8.5*  CREATININE 2.29* 2.16* 2.02*     Results for orders placed or performed during the hospital encounter of 11/12/21 (from the past 24 hour(s))  Basic metabolic panel     Status: Abnormal   Collection Time: 11/15/21  3:23 AM  Result Value Ref Range   Sodium 136 135 - 145 mmol/L   Potassium 4.5 3.5 - 5.1 mmol/L   Chloride 112 (H) 98 - 111 mmol/L   CO2 18 (L) 22 - 32 mmol/L   Glucose, Bld 86 70 - 99 mg/dL   BUN 31 (H) 8 - 23 mg/dL   Creatinine, Ser 2.02 (H) 0.61 - 1.24 mg/dL   Calcium 8.5 (L) 8.9 - 10.3 mg/dL   GFR, Estimated 34 (L) >60 mL/min   Anion gap 6 5 - 15  CBC     Status: Abnormal   Collection Time: 11/15/21  3:23 AM  Result Value Ref Range   WBC 7.6 4.0 - 10.5 K/uL   RBC 3.38 (L) 4.22 - 5.81 MIL/uL   Hemoglobin 8.4 (L) 13.0 - 17.0 g/dL   HCT 27.6 (L) 39.0 - 52.0 %   MCV 81.7 80.0 - 100.0 fL   MCH 24.9 (L) 26.0 - 34.0 pg   MCHC 30.4 30.0 - 36.0 g/dL   RDW 19.0 (H) 11.5 - 15.5 %   Platelets 124 (L) 150 - 400 K/uL   nRBC 0.3 (H) 0.0 - 0.2 %  Magnesium     Status: None   Collection Time: 11/15/21  3:23 AM  Result Value Ref Range   Magnesium 1.9 1.7 - 2.4 mg/dL  TSH      Status: Abnormal   Collection Time: 11/15/21  3:23 AM  Result Value Ref Range   TSH 5.103 (H) 0.350 - 4.500 uIU/mL   No results found for this or any previous visit (from the past 240 hour(s)).  Renal Function: Recent Labs    11/12/21 1259 11/13/21 0232 11/14/21  0408 11/15/21 0323  CREATININE 2.44* 2.29* 2.16* 2.02*   Estimated Creatinine Clearance: 31 mL/min (A) (by C-G formula based on SCr of 2.02 mg/dL (H)).  Radiologic Imaging: CT ABDOMEN PELVIS WO CONTRAST  Addendum Date: 11/13/2021   ADDENDUM REPORT: 11/13/2021 20:42 ADDENDUM: These results were called by telephone at the time of interpretation on 11/13/2021 at 8:38 pm to provider Dr. Jodell Cipro , who verbally acknowledged these results. Electronically Signed   By: Ronney Asters M.D.   On: 11/13/2021 20:42   Result Date: 11/13/2021 CLINICAL DATA:  Concern for recurrence of non-Hodgkin's lymphoma. Patient is status post small bowel resection. EXAM: CT ABDOMEN AND PELVIS WITHOUT CONTRAST TECHNIQUE: Multidetector CT imaging of the abdomen and pelvis was performed following the standard protocol without IV contrast. RADIATION DOSE REDUCTION: This exam was performed according to the departmental dose-optimization program which includes automated exposure control, adjustment of the mA and/or kV according to patient size and/or use of iterative reconstruction technique. COMPARISON:  CT abdomen and pelvis 06/29/2018 FINDINGS: Lower chest: There are moderate-sized bilateral pleural effusions with compressive atelectasis in the bilateral lower lobes. Hepatobiliary: There is diffuse gallbladder wall thickening with surrounding inflammation. No gallstones are seen. No biliary ductal dilatation. The liver is within normal limits. Pancreas: Unremarkable. No pancreatic ductal dilatation or surrounding inflammatory changes. Spleen: Normal in size without focal abnormality. Adrenals/Urinary Tract: Punctate calcifications in the right adrenal gland are  unchanged likely related to prior infection or hemorrhage. Left adrenal gland is within normal limits. Severe left renal atrophy is unchanged. There are 2 calculi in the proximal left ureter. The more distal measures 9 mm in the more proximal measures 3 mm. There is resultant moderate/severe right-sided hydronephrosis. There are additional nonobstructing right renal calculi measuring up to 6 mm. There is a 3 mm calculus in the bladder. The bladder is otherwise within normal limits. Stomach/Bowel: Patient is status post small bowel resection. Anastomosis is seen in the left mid abdomen. Small bowel loops just proximal to this level demonstrate wall thickening with mesenteric edema. There is no evidence for bowel obstruction or free air. No pneumatosis identified. The appendix is within normal limits. The stomach is within normal limits. Vascular/Lymphatic: Aortic atherosclerosis. No enlarged abdominal or pelvic lymph nodes. Reproductive: Prostate gland is enlarged, unchanged. Other: Fat containing inguinal hernias are again noted. There is a small amount of free fluid in the pelvis. There is mild body wall edema. Musculoskeletal: No acute or significant osseous findings. IMPRESSION: 1. Patient is status post small bowel resection. Small bowel loops proximal to the anastomosis demonstrate wall thickening and mesenteric edema worrisome for nonspecific enteritis including infectious, inflammatory and ischemic etiologies. 2. There are 2 calculi within the mid right ureter measuring up to 9 mm. There is moderate/severe right-sided hydronephrosis. 3. Gallbladder wall thickening with surrounding inflammation concerning for cholecystitis. 4. Small volume ascites. 5. Bladder calculi and right renal calculi. 6. Moderate-sized bilateral pleural effusions. 7.  Aortic Atherosclerosis (ICD10-I70.0). Electronically Signed: By: Ronney Asters M.D. On: 11/13/2021 20:17    I independently reviewed the above imaging  studies.  Impression/Recommendation 76 yo M with obstructing right ureteral stones in the setting of a functionally solitary kidney.   -I recommended emergent stent placement to the patient. We discussed the procedure in detail including risks.  -after procedure recommend the following medications for stent discomfort if no other contraindications exist - tamsulosin, NSAID such as celebrex. Can also use PRN ditropan for bladder spasms if present  -After stent, will arrange for staged  ureteroscopy with laser litho. We discussed that he may ultimately require multiple procedures given the substantial stone burden on this side.  ADDENDUM 11/15/2021 1830 I as unable to place a stent in a retrograde fashion in the operating room (see OP note for details). I recommend IR placement of a R neph tube +/- antegrade stent on the right if able. I called and discussed with with Dr Posey Pronto on the IM team  Donald Pore MD 11/15/2021, 3:26 PM  [facility]  Pager: [phone]

## 2021-11-15 NOTE — Anesthesia Procedure Notes (Signed)
Procedure Name: Intubation Date/Time: 11/15/2021 4:49 PM  Performed by: Barrington Ellison, CRNAPre-anesthesia Checklist: Patient identified, Emergency Drugs available, Suction available and Patient being monitored Patient Re-evaluated:Patient Re-evaluated prior to induction Oxygen Delivery Method: Circle System Utilized Preoxygenation: Pre-oxygenation with 100% oxygen Induction Type: IV induction, Rapid sequence and Cricoid Pressure applied Ventilation: Mask ventilation without difficulty Laryngoscope Size: Mac and 4 Grade View: Grade I Tube type: Oral Tube size: 7.5 mm Number of attempts: 1 Airway Equipment and Method: Stylet and Oral airway Placement Confirmation: ETT inserted through vocal cords under direct vision, positive ETCO2 and breath sounds checked- equal and bilateral Secured at: 22 cm Tube secured with: Tape Dental Injury: Teeth and Oropharynx as per pre-operative assessment

## 2021-11-15 NOTE — Op Note (Signed)
Operative Note  Preoperative diagnosis:  1.  Right ureteral stone 2. Ureteral obstruction secondary to stone 3. Solitary kidney  Postoperative diagnosis: 1.  same  Procedure(s): 1.  Cystoscopy 2. right retrograde pyelogram with interpretation 3. Fluoroscopy <1 hour with intraoperative interpretation  Surgeon: Donald Pore, MD  Assistants:  None  Anesthesia:  General  Complications:  None  EBL:  none  Specimens: 1. none  Drains/Catheters: 1.  none  Intraoperative findings:   Cystoscopy demonstrated no suspicious lesions, masses, stones or other pathology. Right Retrograde pyelogram demonstrated right ureteral obstruction - no contrast passed by the stone on the right. I was not able to place a stent after trying multiple ureteral catheters and wire types.   Indication:  Jack Fuentes is a 76 y.o. male with the above diagnoses After reviewing the management options for treatment, it was recommended he proceed to the OR for decompression of his right urinary system. He elected to proceed with the above surgical procedure(s). We have discussed the potential benefits and risks of the procedure, side effects of the proposed treatment, the likelihood of the patient achieving the goals of the procedure, and any potential problems that might occur during the procedure or recuperation. Informed consent has been obtained.  Description of procedure: The patient was taken to the operating room and general anesthesia was induced.  The patient was placed in the dorsal lithotomy position, prepped and draped in the usual sterile fashion, and preoperative antibiotics were administered. A preoperative time-out was performed.   Cystourethroscopy was performed.  The patient's urethra was examined and was normal. There was some bilobar prostatic hypertrophy. The bladder was then systematically examined in its entirety. There was no evidence for any bladder tumors, stones, or other mucosal  pathology.    Attention then turned to the right ureteral orifice. A 0.038 zip wire was passed through the right orifice and over the wire a 5 Fr open ended catheter was inserted and passed up to the level of the proximal ureter. Omnipaque contrast was injected through the ureteral catheter and a retrograde pyelogram was performed with findings as dictated above - contrast made it to the level of the stone but was not able to pass. The wire was then replaced and the open ended catheter was removed. I was not able to pass the wire. I inserted the urethral catheter up to the level of the stone and used to to try to facilitate the wire passing the stone. It was unable to pass.  I exchanged the wire for a sensor wire and after an extended effort was not able to pass the stone. I then exchanged the ureteral catheter for a Kumpe catheter, and tried both wires once again without success. Ultimately I elected to terminate the procedure after an extended amount of time. The wire and Kumpe catheter were removed.  The bladder was then emptied and the procedure ended.  The patient appeared to tolerate the procedure well and without complications.  The patient was able to be awakened and transferred to the recovery unit in satisfactory condition.   Plan:   Recommend IR placement of nephrostomy tube [facility]

## 2021-11-15 NOTE — Care Management (Signed)
  Transition of Care [facility]) Screening Note   Patient Details  Name: Jack Fuentes Date of Birth: [birth year]   Transition of Care [facility]) CM/SW Contact:    Bethena Roys, RN Phone Number: 11/15/2021, 12:28 PM    Transition of Care Department [facility]) has reviewed the patient and no TOC needs have been identified at this time. We will continue to monitor patient advancement through interdisciplinary progression rounds. If new patient transition needs arise, please place a TOC consult.

## 2021-11-15 NOTE — Progress Notes (Signed)
Rounding Note    Patient Name: Jack Fuentes Date of Encounter: 11/15/2021  [place] Cardiologist: Freada Bergeron, MD   Subjective   My first time meeting patient this morning. He reports mild improvement in symptoms of fatigue and shortness of breath. Expresses concern that his hemoglobin has fallen slightly since his transfusion. He continues to deny symptoms of syncope/presyncope.  Inpatient Medications    Scheduled Meds:  gabapentin  300 mg Oral BID   multivitamin with minerals  1 tablet Oral Daily   tamsulosin  0.4 mg Oral Daily   Continuous Infusions:  ferric gluconate (FERRLECIT) IVPB 250 mg (11/15/21 1002)   PRN Meds: acetaminophen **OR** acetaminophen, ondansetron **OR** ondansetron (ZOFRAN) IV, mouth rinse, senna-docusate   Vital Signs    Vitals:   11/14/21 1549 11/14/21 2130 11/15/21 0006 11/15/21 0454  BP:  117/64 123/72 117/75  Pulse:  91 78 72  Resp: '20 20 19 20  '$ Temp: 97.7 F (36.5 C) 98 F (36.7 C) 98 F (36.7 C) 98 F (36.7 C)  TempSrc: Oral Oral Oral Oral  SpO2:  94%  94%  Weight:      Height:       No intake or output data in the 24 hours ending 11/15/21 1022    11/12/2021    9:04 PM 08/08/2018    2:23 PM 08/06/2018   11:33 AM  Last 3 Weights  Weight (lbs) 176 lb 145 lb 14.4 oz 145 lb  Weight (kg) 79.833 kg 66.18 kg 65.772 kg      Telemetry    Sinus rhythm with intermittent dropped beats/ second degree AV block type II with 2:1 pattern - Personally Reviewed  ECG    No ECG yet today. Ordered.  Physical Exam   GEN: No acute distress.   Neck: No JVD Cardiac: RRR, no murmurs, rubs, or gallops.  Respiratory: Clear to auscultation bilaterally. GI: Soft, nontender, non-distended  MS: No edema; No deformity. Neuro:  Nonfocal  Psych: Normal affect   Labs    High Sensitivity Troponin:   Recent Labs  Lab 11/12/21 1259 11/12/21 1623  TROPONINIHS 23* 21*     Chemistry Recent Labs  Lab 11/12/21 1259  11/13/21 0232 11/14/21 0408 11/15/21 0323  NA 141 138 136 136  K 5.0 4.9 4.2 4.5  CL 114* 111 110 112*  CO2 19* 18* 18* 18*  GLUCOSE 103* 95 88 86  BUN 34* 36* 35* 31*  CREATININE 2.44* 2.29* 2.16* 2.02*  CALCIUM 8.8* 8.6* 8.4* 8.5*  MG  --   --   --  1.9  PROT 6.2*  --   --   --   ALBUMIN 3.3*  --   --   --   AST 22  --   --   --   ALT 27  --   --   --   ALKPHOS 50  --   --   --   BILITOT 0.4  --   --   --   GFRNONAA 27* 29* 31* 34*  ANIONGAP '8 9 8 6    '$ Lipids No results for input(s): "CHOL", "TRIG", "HDL", "LABVLDL", "LDLCALC", "CHOLHDL" in the last 168 hours.  Hematology Recent Labs  Lab 11/13/21 1026 11/13/21 2009 11/14/21 0408 11/15/21 0323  WBC 9.8  --  7.9 7.6  RBC 3.23*  --  3.36* 3.38*  HGB 7.8* 9.0* 8.2* 8.4*  HCT 26.6* 30.3* 27.6* 27.6*  MCV 82.4  --  82.1 81.7  MCH  24.1*  --  24.4* 24.9*  MCHC 29.3*  --  29.7* 30.4  RDW 18.7*  --  18.5* 19.0*  PLT 132*  --  124* 124*   Thyroid No results for input(s): "TSH", "FREET4" in the last 168 hours.  BNPNo results for input(s): "BNP", "PROBNP" in the last 168 hours.  DDimer No results for input(s): "DDIMER" in the last 168 hours.   Radiology    CT ABDOMEN PELVIS WO CONTRAST  Addendum Date: 11/13/2021   ADDENDUM REPORT: 11/13/2021 20:42 ADDENDUM: These results were called by telephone at the time of interpretation on 11/13/2021 at 8:38 pm to provider Dr. Jodell Cipro , who verbally acknowledged these results. Electronically Signed   By: Ronney Asters M.D.   On: 11/13/2021 20:42   Result Date: 11/13/2021 CLINICAL DATA:  Concern for recurrence of non-Hodgkin's lymphoma. Patient is status post small bowel resection. EXAM: CT ABDOMEN AND PELVIS WITHOUT CONTRAST TECHNIQUE: Multidetector CT imaging of the abdomen and pelvis was performed following the standard protocol without IV contrast. RADIATION DOSE REDUCTION: This exam was performed according to the departmental dose-optimization program which includes automated exposure  control, adjustment of the mA and/or kV according to patient size and/or use of iterative reconstruction technique. COMPARISON:  CT abdomen and pelvis 06/29/2018 FINDINGS: Lower chest: There are moderate-sized bilateral pleural effusions with compressive atelectasis in the bilateral lower lobes. Hepatobiliary: There is diffuse gallbladder wall thickening with surrounding inflammation. No gallstones are seen. No biliary ductal dilatation. The liver is within normal limits. Pancreas: Unremarkable. No pancreatic ductal dilatation or surrounding inflammatory changes. Spleen: Normal in size without focal abnormality. Adrenals/Urinary Tract: Punctate calcifications in the right adrenal gland are unchanged likely related to prior infection or hemorrhage. Left adrenal gland is within normal limits. Severe left renal atrophy is unchanged. There are 2 calculi in the proximal left ureter. The more distal measures 9 mm in the more proximal measures 3 mm. There is resultant moderate/severe right-sided hydronephrosis. There are additional nonobstructing right renal calculi measuring up to 6 mm. There is a 3 mm calculus in the bladder. The bladder is otherwise within normal limits. Stomach/Bowel: Patient is status post small bowel resection. Anastomosis is seen in the left mid abdomen. Small bowel loops just proximal to this level demonstrate wall thickening with mesenteric edema. There is no evidence for bowel obstruction or free air. No pneumatosis identified. The appendix is within normal limits. The stomach is within normal limits. Vascular/Lymphatic: Aortic atherosclerosis. No enlarged abdominal or pelvic lymph nodes. Reproductive: Prostate gland is enlarged, unchanged. Other: Fat containing inguinal hernias are again noted. There is a small amount of free fluid in the pelvis. There is mild body wall edema. Musculoskeletal: No acute or significant osseous findings. IMPRESSION: 1. Patient is status post small bowel resection.  Small bowel loops proximal to the anastomosis demonstrate wall thickening and mesenteric edema worrisome for nonspecific enteritis including infectious, inflammatory and ischemic etiologies. 2. There are 2 calculi within the mid right ureter measuring up to 9 mm. There is moderate/severe right-sided hydronephrosis. 3. Gallbladder wall thickening with surrounding inflammation concerning for cholecystitis. 4. Small volume ascites. 5. Bladder calculi and right renal calculi. 6. Moderate-sized bilateral pleural effusions. 7.  Aortic Atherosclerosis (ICD10-I70.0). Electronically Signed: By: Ronney Asters M.D. On: 11/13/2021 20:17   ECHOCARDIOGRAM COMPLETE  Result Date: 11/13/2021    ECHOCARDIOGRAM REPORT   Patient Name:   Jack Fuentes Date of Exam: 11/13/2021 Medical Rec #:  [id number]         Height:  68.0 in Accession #:    [id number]        Weight:       176.0 lb Date of Birth:  Aug 21, 1945         BSA:          1.936 m Patient Age:    29 years          BP:           112/66 mmHg Patient Gender: M                 HR:           74 bpm. Exam Location:  Inpatient Procedure: 2D Echo, Color Doppler and Cardiac Doppler Indications:    Heart block, 2nd degree  History:        Patient has prior history of Echocardiogram examinations, most                 recent 07/31/2018. Arrythmias:LBBB; Risk Factors:Dyslipidemia.  Sonographer:    Memory Argue Referring Phys: [place]  1. Left ventricular ejection fraction, by estimation, is 55 to 60%. The left ventricle has normal function. The left ventricle has no regional wall motion abnormalities. Left ventricular diastolic parameters are consistent with Grade II diastolic dysfunction (pseudonormalization). Elevated left atrial pressure.  2. Right ventricular systolic function is normal. The right ventricular size is normal. There is moderately elevated pulmonary artery systolic pressure.  3. Left atrial size was mild to moderately dilated.  4. The  mitral valve is abnormal. Moderate mitral valve regurgitation. No evidence of mitral stenosis.  5. The tricuspid valve is abnormal. Tricuspid valve regurgitation is mild to moderate.  6. The aortic valve was not well visualized. There is mild calcification of the aortic valve. There is mild thickening of the aortic valve. Aortic valve regurgitation is mild to moderate. No aortic stenosis is present.  7. The inferior vena cava is dilated in size with >50% respiratory variability, suggesting right atrial pressure of 8 mmHg. FINDINGS  Left Ventricle: Left ventricular ejection fraction, by estimation, is 55 to 60%. The left ventricle has normal function. The left ventricle has no regional wall motion abnormalities. The left ventricular internal cavity size was normal in size. There is  no left ventricular hypertrophy. Left ventricular diastolic parameters are consistent with Grade II diastolic dysfunction (pseudonormalization). Elevated left atrial pressure. Right Ventricle: The right ventricular size is normal. Right vetricular wall thickness was not well visualized. Right ventricular systolic function is normal. There is moderately elevated pulmonary artery systolic pressure. The tricuspid regurgitant velocity is 3.27 m/s, and with an assumed right atrial pressure of 8 mmHg, the estimated right ventricular systolic pressure is 37.6 mmHg. Left Atrium: Left atrial size was mild to moderately dilated. Right Atrium: Right atrial size was normal in size. Pericardium: There is no evidence of pericardial effusion. Mitral Valve: The mitral valve is abnormal. There is mild thickening of the mitral valve leaflet(s). There is mild calcification of the mitral valve leaflet(s). Mild mitral annular calcification. Moderate mitral valve regurgitation. No evidence of mitral  valve stenosis. MV peak gradient, 8.1 mmHg. The mean mitral valve gradient is 4.0 mmHg. Tricuspid Valve: The tricuspid valve is abnormal. Tricuspid valve  regurgitation is mild to moderate. No evidence of tricuspid stenosis. Aortic Valve: The aortic valve was not well visualized. There is mild calcification of the aortic valve. There is mild thickening of the aortic valve. There is mild aortic valve annular calcification. Aortic valve regurgitation is  mild to moderate. Aortic regurgitation PHT measures 423 msec. No aortic stenosis is present. Aortic valve mean gradient measures 5.0 mmHg. Aortic valve peak gradient measures 8.9 mmHg. Aortic valve area, by VTI measures 2.44 cm. Pulmonic Valve: The pulmonic valve was not well visualized. Pulmonic valve regurgitation is not visualized. No evidence of pulmonic stenosis. Aorta: The aortic root is normal in size and structure. Venous: The inferior vena cava is dilated in size with greater than 50% respiratory variability, suggesting right atrial pressure of 8 mmHg. IAS/Shunts: No atrial level shunt detected by color flow Doppler.  LEFT VENTRICLE PLAX 2D LVIDd:         4.20 cm   Diastology LVIDs:         3.00 cm   LV e' medial:    5.55 cm/s LV PW:         0.90 cm   LV E/e' medial:  24.9 LV IVS:        0.90 cm   LV e' lateral:   9.68 cm/s LVOT diam:     2.20 cm   LV E/e' lateral: 14.3 LV SV:         73 LV SV Index:   38 LVOT Area:     3.80 cm  RIGHT VENTRICLE RV S prime:     11.10 cm/s TAPSE (M-mode): 1.9 cm LEFT ATRIUM             Index        RIGHT ATRIUM           Index LA diam:        3.50 cm 1.81 cm/m   RA Area:     14.40 cm LA Vol (A2C):   69.8 ml 36.06 ml/m  RA Volume:   31.80 ml  16.43 ml/m LA Vol (A4C):   91.9 ml 47.48 ml/m LA Biplane Vol: 80.8 ml 41.74 ml/m  AORTIC VALVE AV Area (Vmax):    2.47 cm AV Area (Vmean):   2.39 cm AV Area (VTI):     2.44 cm AV Vmax:           149.00 cm/s AV Vmean:          101.000 cm/s AV VTI:            0.301 m AV Peak Grad:      8.9 mmHg AV Mean Grad:      5.0 mmHg LVOT Vmax:         97.00 cm/s LVOT Vmean:        63.400 cm/s LVOT VTI:          0.193 m LVOT/AV VTI ratio: 0.64 AI  PHT:            423 msec  AORTA Ao Root diam: 3.30 cm MITRAL VALVE                 TRICUSPID VALVE MV Area (PHT): 4.80 cm      TR Peak grad:   42.8 mmHg MV Area VTI:   2.08 cm      TR Vmax:        327.00 cm/s MV Peak grad:  8.1 mmHg MV Mean grad:  4.0 mmHg      SHUNTS MV Vmax:       1.42 m/s      Systemic VTI:  0.19 m MV Vmean:      93.0 cm/s     Systemic Diam: 2.20 cm MV Decel Time: 158 msec MR Peak grad:  71.0 mmHg MR Mean grad:   56.0 mmHg MR Vmax:        421.40 cm/s MR Vmean:       352.0 cm/s MR PISA:        0.57 cm MR PISA Radius: 0.30 cm MV E velocity: 138.00 cm/s MV A velocity: 72.40 cm/s MV E/A ratio:  1.91 Carlyle Dolly MD Electronically signed by Carlyle Dolly MD Signature Date/Time: 11/13/2021/1:31:52 PM    Final     Cardiac Studies   11/13/21 TTE  IMPRESSIONS    1. Left ventricular ejection fraction, by estimation, is 55 to 60%. The  left ventricle has normal function. The left ventricle has no regional  wall motion abnormalities. Left ventricular diastolic parameters are  consistent with Grade II diastolic  dysfunction (pseudonormalization). Elevated left atrial pressure.   2. Right ventricular systolic function is normal. The right ventricular  size is normal. There is moderately elevated pulmonary artery systolic  pressure.   3. Left atrial size was mild to moderately dilated.   4. The mitral valve is abnormal. Moderate mitral valve regurgitation. No  evidence of mitral stenosis.   5. The tricuspid valve is abnormal. Tricuspid valve regurgitation is mild  to moderate.   6. The aortic valve was not well visualized. There is mild calcification  of the aortic valve. There is mild thickening of the aortic valve. Aortic  valve regurgitation is mild to moderate. No aortic stenosis is present.   7. The inferior vena cava is dilated in size with >50% respiratory  variability, suggesting right atrial pressure of 8 mmHg.   FINDINGS   Left Ventricle: Left ventricular ejection  fraction, by estimation, is 55  to 60%. The left ventricle has normal function. The left ventricle has no  regional wall motion abnormalities. The left ventricular internal cavity  size was normal in size. There is   no left ventricular hypertrophy. Left ventricular diastolic parameters  are consistent with Grade II diastolic dysfunction (pseudonormalization).  Elevated left atrial pressure.   Patient Profile     76 y.o. male with a hx of non-Hodgkins lymphoma s/p chemo and XRT, HLD, peripheral neuropathy, normocytic anemia, and chronic RBBB and LAFB who presented with profound anemia with suspected AKI on CKD with found to have intermittent Mobitz type II AVB on telemetry for which Cardiology was consulted.   Assessment & Plan    Second degree AV block type II  Patient not on any nodal talking agents was found with second-degree AV block type II on both telemetry and ECG on arrival.  Patient has a known history of right bundle branch block and left anterior fascicular block.  Review of overnight telemetry shows intermittent episodes of same type II AV node block.  Patient continues to deny history of syncope or presyncope both today and recent weeks.  Continue anemia and AKI management per primary team Continue to avoid all nodal blocking agents Continue close monitoring of electrolytes EP consulted this morning to evaluate patient.  Chronic HFpEF  Patient previously noted with a reduced ejection fraction of 45-50% on his 2020 transthoracic echocardiogram, now recovered to 55 to 60% on repeat echo this admission. Etiology of patient's cardiomyopathy is unknown.  Continue as needed diuresis to maintain euvolemia while receiving blood transfusions and other intravascular treatment. Patient is unable to tolerate heart failure goal-directed medical therapy beta-blocker due to AV block.  He is also unable to tolerate ACE/ARB/ARNI/MRA due to his current AKI (improving).  Recommend adding  goal-directed medical therapy  as able.   Normocytic anemia  Patient admitted due to shortness of breath and fatigue found with a hemoglobin of 6.2 on admission.  Patient reports that he saw his PCP in mid August of this year and was found with a hemoglobin of 5.6 with low ferritin.  Patient has been on Plavix due to his coronary artery disease, denies GI bleed.  Patient received 2 units of packed red blood cells on 9/9 and now has a hemoglobin of 8.4  Continue to hold Plavix and trend CBC Continue management with IV ferritin per primary team   AKI on suspected CKD Moderate/Severe right sided hydronephrosis  Creatinine 2.02 today, improved from 2.44 on admission. CT abd/pelvis with finding of severe right side hydronephrosis with right renal calculi.    Management per primary team with outpatient urology follow up     CAD (presumed)  Patient reports previous stress testing with [facility] clinic that was notable for an inferior scar. He has been on Plavix since. Patient without symptoms of chest pain. TTE shows improved LVEF and no wall motion abnormalities.   Recommend outpatient follow-up for medical management of CAD. No strong indication for plavix. ASA could be considered in future upon stabilization of HGB.   For questions or updates, please contact [place] Please consult [email or web address] for contact info under        Signed, Jaydenn Boccio, PA-C  11/15/2021, 10:22 AM

## 2021-11-15 NOTE — H&P (View-Only) (Signed)
Urology Consult   Physician requesting consult: Dr Leigh Aurora  Reason for consult: R hydronephrosis  History of Present Illness: Jack Fuentes is a 76 y.o. who was admitted to [facility] c/o of shortness of breath and fatigue and found to have a anemia and a second degree heart block. A CT scan was obtained and showed new R hydronephrosis.   I reviewed Jack Fuentes CT scan.  It shows mild/moderate hydronephrosis tracking down to 2 proximal ureteral calcifications the largest of which is around 6 or 7 mm.  The left kidney is present but severely atrophic and likely nonfunctioning  Cr today 2.02, yesterday 2.16; no white count and UA 9/8 unremarkable. UOP overnight was 1.46 L.  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 At some point he describes what sounds like a renal scan that showed 2% function.  Past Medical History:  Diagnosis Date   Cancer ([place])    lymphoma-large B-cell   History of left bundle branch block (LBBB)    Hyperlipidemia    Peripheral neuropathy    SBO (small bowel obstruction) ([place]) 06/2018    Past Surgical History:  Procedure Laterality Date   EYE SURGERY     IR PATIENT EVAL TECH 0-60 MINS  08/01/2018   LAPAROTOMY N/A 07/03/2018   Procedure: EXPLORATORY LAPAROTOMY, LYSIS OF ADHESION, BOWEL RESECTION, RESECTION MESTASTATIC TUMOR;  Surgeon: Jovita Kussmaul, MD;  Location: [place];  Service: General;  Laterality: N/A;   [facility] Medications:  Home Meds:  No current facility-administered medications on file prior to encounter.   Current Outpatient Medications on File Prior to Encounter  Medication Sig Dispense Refill   acetaminophen (TYLENOL) 325 MG tablet Take 2 tablets (650 mg total) by mouth every 6 (six) hours. (Patient taking differently: Take 650 mg by mouth every 6 (six) hours as needed for moderate pain.)     gabapentin (NEURONTIN) 600 MG tablet Take 600 mg by mouth 2 (two)  times daily.      Multiple Vitamin (MULTIVITAMIN WITH MINERALS) TABS tablet Take 1 tablet by mouth daily.     Turmeric 500 MG CAPS Take 500 mg by mouth 2 (two) times daily.     valsartan (DIOVAN) 40 MG tablet Take 40 mg by mouth daily as needed (For blood pressure).     clopidogrel (PLAVIX) 75 MG tablet Take 75 mg by mouth daily. (Patient not taking: Reported on 11/12/2021)     methocarbamol (ROBAXIN) 500 MG tablet Take 1 tablet (500 mg total) by mouth every 8 (eight) hours as needed for muscle spasms. (Patient not taking: Reported on 11/12/2021)     rosuvastatin (CRESTOR) 10 MG tablet Take 10 mg by mouth daily. (Patient not taking: Reported on 11/12/2021)     saccharomyces boulardii (DAILY PROBIOTIC SUPPLEMENT) 250 MG capsule Take 250 mg by mouth 2 (two) times daily. FOR SUPPLEMENT(DO NOT CRUSH) (Patient not taking: Reported on 11/12/2021)       Scheduled Meds:  chlorhexidine       furosemide  40 mg Oral Daily   gabapentin  300 mg Oral BID   multivitamin with minerals  1 tablet Oral Daily   tamsulosin  0.4 mg Oral Daily   Continuous Infusions:  ceFAZolin     PRN Meds:.acetaminophen **OR** acetaminophen, ceFAZolin, chlorhexidine, ondansetron **OR** ondansetron (ZOFRAN) IV, mouth rinse, senna-docusate  Allergies:  Allergies  Allergen Reactions   Levofloxacin Swelling   Midazolam Swelling  Family History  Problem Relation Age of Onset   Heart disease Father    Multiple myeloma Sister     Social History:  reports that he has never smoked. He has never used smokeless tobacco. He reports that he does not currently use alcohol. He reports that he does not use drugs.  ROS: A complete review of systems was performed.  All systems are negative except for pertinent findings as noted.  Physical Exam:  Vital signs in last 24 hours: Temp:  [97.7 F (36.5 C)-98 F (36.7 C)] 98 F (36.7 C) (09/11 1253) Pulse Rate:  [67-91] 67 (09/11 1253) Resp:  [18-20] 18 (09/11 1253) BP:  (117-123)/(60-75) 122/60 (09/11 1253) SpO2:  [94 %-97 %] 97 % (09/11 1253) Constitutional:  Alert and oriented, No acute distress Cardiovascular: Regular rate and rhythm Respiratory: Normal respiratory effort, Lungs clear bilaterally GI: Abdomen is soft, nontender, nondistended, no abdominal masses GU: No CVA tenderness Neurologic: Grossly intact, no focal deficits Psychiatric: Normal mood and affect  Laboratory Data:  Recent Labs    11/13/21 0232 11/13/21 1026 11/13/21 2009 11/14/21 0408 11/15/21 0323  WBC 8.0 9.8  --  7.9 7.6  HGB 7.5* 7.8* 9.0* 8.2* 8.4*  HCT 25.5* 26.6* 30.3* 27.6* 27.6*  PLT 124* 132*  --  124* 124*    Recent Labs    11/13/21 0232 11/14/21 0408 11/15/21 0323  NA 138 136 136  K 4.9 4.2 4.5  CL 111 110 112*  GLUCOSE 95 88 86  BUN 36* 35* 31*  CALCIUM 8.6* 8.4* 8.5*  CREATININE 2.29* 2.16* 2.02*     Results for orders placed or performed during the hospital encounter of 11/12/21 (from the past 24 hour(s))  Basic metabolic panel     Status: Abnormal   Collection Time: 11/15/21  3:23 AM  Result Value Ref Range   Sodium 136 135 - 145 mmol/L   Potassium 4.5 3.5 - 5.1 mmol/L   Chloride 112 (H) 98 - 111 mmol/L   CO2 18 (L) 22 - 32 mmol/L   Glucose, Bld 86 70 - 99 mg/dL   BUN 31 (H) 8 - 23 mg/dL   Creatinine, Ser 2.02 (H) 0.61 - 1.24 mg/dL   Calcium 8.5 (L) 8.9 - 10.3 mg/dL   GFR, Estimated 34 (L) >60 mL/min   Anion gap 6 5 - 15  CBC     Status: Abnormal   Collection Time: 11/15/21  3:23 AM  Result Value Ref Range   WBC 7.6 4.0 - 10.5 K/uL   RBC 3.38 (L) 4.22 - 5.81 MIL/uL   Hemoglobin 8.4 (L) 13.0 - 17.0 g/dL   HCT 27.6 (L) 39.0 - 52.0 %   MCV 81.7 80.0 - 100.0 fL   MCH 24.9 (L) 26.0 - 34.0 pg   MCHC 30.4 30.0 - 36.0 g/dL   RDW 19.0 (H) 11.5 - 15.5 %   Platelets 124 (L) 150 - 400 K/uL   nRBC 0.3 (H) 0.0 - 0.2 %  Magnesium     Status: None   Collection Time: 11/15/21  3:23 AM  Result Value Ref Range   Magnesium 1.9 1.7 - 2.4 mg/dL  TSH      Status: Abnormal   Collection Time: 11/15/21  3:23 AM  Result Value Ref Range   TSH 5.103 (H) 0.350 - 4.500 uIU/mL   No results found for this or any previous visit (from the past 240 hour(s)).  Renal Function: Recent Labs    11/12/21 1259 11/13/21 0232 11/14/21  0408 11/15/21 0323  CREATININE 2.44* 2.29* 2.16* 2.02*   Estimated Creatinine Clearance: 31 mL/min (A) (by C-G formula based on SCr of 2.02 mg/dL (H)).  Radiologic Imaging: CT ABDOMEN PELVIS WO CONTRAST  Addendum Date: 11/13/2021   ADDENDUM REPORT: 11/13/2021 20:42 ADDENDUM: These results were called by telephone at the time of interpretation on 11/13/2021 at 8:38 pm to provider Dr. Jodell Cipro , who verbally acknowledged these results. Electronically Signed   By: Ronney Asters M.D.   On: 11/13/2021 20:42   Result Date: 11/13/2021 CLINICAL DATA:  Concern for recurrence of non-Hodgkin's lymphoma. Patient is status post small bowel resection. EXAM: CT ABDOMEN AND PELVIS WITHOUT CONTRAST TECHNIQUE: Multidetector CT imaging of the abdomen and pelvis was performed following the standard protocol without IV contrast. RADIATION DOSE REDUCTION: This exam was performed according to the departmental dose-optimization program which includes automated exposure control, adjustment of the mA and/or kV according to patient size and/or use of iterative reconstruction technique. COMPARISON:  CT abdomen and pelvis 06/29/2018 FINDINGS: Lower chest: There are moderate-sized bilateral pleural effusions with compressive atelectasis in the bilateral lower lobes. Hepatobiliary: There is diffuse gallbladder wall thickening with surrounding inflammation. No gallstones are seen. No biliary ductal dilatation. The liver is within normal limits. Pancreas: Unremarkable. No pancreatic ductal dilatation or surrounding inflammatory changes. Spleen: Normal in size without focal abnormality. Adrenals/Urinary Tract: Punctate calcifications in the right adrenal gland are  unchanged likely related to prior infection or hemorrhage. Left adrenal gland is within normal limits. Severe left renal atrophy is unchanged. There are 2 calculi in the proximal left ureter. The more distal measures 9 mm in the more proximal measures 3 mm. There is resultant moderate/severe right-sided hydronephrosis. There are additional nonobstructing right renal calculi measuring up to 6 mm. There is a 3 mm calculus in the bladder. The bladder is otherwise within normal limits. Stomach/Bowel: Patient is status post small bowel resection. Anastomosis is seen in the left mid abdomen. Small bowel loops just proximal to this level demonstrate wall thickening with mesenteric edema. There is no evidence for bowel obstruction or free air. No pneumatosis identified. The appendix is within normal limits. The stomach is within normal limits. Vascular/Lymphatic: Aortic atherosclerosis. No enlarged abdominal or pelvic lymph nodes. Reproductive: Prostate gland is enlarged, unchanged. Other: Fat containing inguinal hernias are again noted. There is a small amount of free fluid in the pelvis. There is mild body wall edema. Musculoskeletal: No acute or significant osseous findings. IMPRESSION: 1. Patient is status post small bowel resection. Small bowel loops proximal to the anastomosis demonstrate wall thickening and mesenteric edema worrisome for nonspecific enteritis including infectious, inflammatory and ischemic etiologies. 2. There are 2 calculi within the mid right ureter measuring up to 9 mm. There is moderate/severe right-sided hydronephrosis. 3. Gallbladder wall thickening with surrounding inflammation concerning for cholecystitis. 4. Small volume ascites. 5. Bladder calculi and right renal calculi. 6. Moderate-sized bilateral pleural effusions. 7.  Aortic Atherosclerosis (ICD10-I70.0). Electronically Signed: By: Ronney Asters M.D. On: 11/13/2021 20:17    I independently reviewed the above imaging  studies.  Impression/Recommendation 76 yo M with obstructing right ureteral stones in the setting of a functionally solitary kidney.   -I recommended emergent stent placement to the patient. We discussed the procedure in detail including risks.  -after procedure recommend the following medications for stent discomfort if no other contraindications exist - tamsulosin, NSAID such as celebrex. Can also use PRN ditropan for bladder spasms if present  -After stent, will arrange for staged  ureteroscopy with laser litho. We discussed that he may ultimately require multiple procedures given the substantial stone burden on this side.  Donald Pore MD 11/15/2021, 3:26 PM  [facility]  Pager: [phone]

## 2021-11-15 NOTE — Interval H&P Note (Signed)
History and Physical Interval Note:  11/15/2021 3:39 PM  Jack Fuentes  has presented today for surgery, with the diagnosis of Ureteral Stones.  The various methods of treatment have been discussed with the patient and family. After consideration of risks, benefits and other options for treatment, the patient has consented to  Procedure(s): CYSTOSCOPY WITH RETROGRADE PYELOGRAM/URETERAL STENT PLACEMENT (Right) as a surgical intervention.  The patient's history has been reviewed, patient examined, no change in status, stable for surgery.  I have reviewed the patient's chart and labs.  Questions were answered to the patient's satisfaction.     [name]

## 2021-11-15 NOTE — Anesthesia Postprocedure Evaluation (Signed)
Anesthesia Post Note  Patient: Jack Fuentes  Procedure(s) Performed: CYSTOSCOPY WITH RETROGRADE PYELOGRAM (Right)     Patient location during evaluation: PACU Anesthesia Type: General Level of consciousness: awake and alert Pain management: pain level controlled Vital Signs Assessment: post-procedure vital signs reviewed and stable Respiratory status: spontaneous breathing, nonlabored ventilation, respiratory function stable and patient connected to face mask oxygen Cardiovascular status: blood pressure returned to baseline and stable Postop Assessment: no apparent nausea or vomiting Anesthetic complications: no   No notable events documented.  Last Vitals:  Vitals:   11/15/21 1930 11/15/21 2014  BP: 121/72 125/72  Pulse: 78 78  Resp: (!) 23 (!) 24  Temp: (!) 36.4 C (!) 36.4 C  SpO2: 92% 91%    Last Pain:  Vitals:   11/15/21 2014  TempSrc: Oral  PainSc:                  Santa Lighter

## 2021-11-15 NOTE — Progress Notes (Signed)
Found a half pill of gabapentin on pt's table.  Pt stated that was a gabapentin from night shift, but he did not take it. Wasted in stericycle with Judson Roch, RN.  Idolina Primer, RN

## 2021-11-15 NOTE — Progress Notes (Signed)
Mobility Specialist - Progress Note   11/15/21 1549  Mobility  Activity Contraindicated/medical hold   Pt was being taken down to OR. Will follow up if time permits.  Larey Seat

## 2021-11-16 ENCOUNTER — Inpatient Hospital Stay (HOSPITAL_COMMUNITY): Payer: Medicare Other

## 2021-11-16 ENCOUNTER — Encounter (HOSPITAL_COMMUNITY): Payer: Self-pay | Admitting: Urology

## 2021-11-16 ENCOUNTER — Telehealth: Payer: Self-pay | Admitting: Hematology

## 2021-11-16 DIAGNOSIS — N179 Acute kidney failure, unspecified: Secondary | ICD-10-CM | POA: Diagnosis not present

## 2021-11-16 DIAGNOSIS — D509 Iron deficiency anemia, unspecified: Secondary | ICD-10-CM | POA: Diagnosis not present

## 2021-11-16 DIAGNOSIS — J90 Pleural effusion, not elsewhere classified: Secondary | ICD-10-CM | POA: Diagnosis not present

## 2021-11-16 DIAGNOSIS — D649 Anemia, unspecified: Secondary | ICD-10-CM | POA: Diagnosis not present

## 2021-11-16 DIAGNOSIS — N184 Chronic kidney disease, stage 4 (severe): Secondary | ICD-10-CM | POA: Diagnosis not present

## 2021-11-16 DIAGNOSIS — I441 Atrioventricular block, second degree: Secondary | ICD-10-CM | POA: Diagnosis not present

## 2021-11-16 HISTORY — PX: IR NEPHROSTOMY PLACEMENT RIGHT: IMG6064

## 2021-11-16 LAB — CBC WITH DIFFERENTIAL/PLATELET
Abs Immature Granulocytes: 0.04 10*3/uL (ref 0.00–0.07)
Basophils Absolute: 0 10*3/uL (ref 0.0–0.1)
Basophils Relative: 0 %
Eosinophils Absolute: 0 10*3/uL (ref 0.0–0.5)
Eosinophils Relative: 0 %
HCT: 28.2 % — ABNORMAL LOW (ref 39.0–52.0)
Hemoglobin: 8.4 g/dL — ABNORMAL LOW (ref 13.0–17.0)
Immature Granulocytes: 1 %
Lymphocytes Relative: 6 %
Lymphs Abs: 0.4 10*3/uL — ABNORMAL LOW (ref 0.7–4.0)
MCH: 24.9 pg — ABNORMAL LOW (ref 26.0–34.0)
MCHC: 29.8 g/dL — ABNORMAL LOW (ref 30.0–36.0)
MCV: 83.4 fL (ref 80.0–100.0)
Monocytes Absolute: 0.3 10*3/uL (ref 0.1–1.0)
Monocytes Relative: 4 %
Neutro Abs: 6.1 10*3/uL (ref 1.7–7.7)
Neutrophils Relative %: 89 %
Platelets: 123 10*3/uL — ABNORMAL LOW (ref 150–400)
RBC: 3.38 MIL/uL — ABNORMAL LOW (ref 4.22–5.81)
RDW: 19.6 % — ABNORMAL HIGH (ref 11.5–15.5)
WBC: 6.9 10*3/uL (ref 4.0–10.5)
nRBC: 0 % (ref 0.0–0.2)

## 2021-11-16 LAB — BASIC METABOLIC PANEL
Anion gap: 8 (ref 5–15)
BUN: 27 mg/dL — ABNORMAL HIGH (ref 8–23)
CO2: 18 mmol/L — ABNORMAL LOW (ref 22–32)
Calcium: 8.7 mg/dL — ABNORMAL LOW (ref 8.9–10.3)
Chloride: 110 mmol/L (ref 98–111)
Creatinine, Ser: 2.21 mg/dL — ABNORMAL HIGH (ref 0.61–1.24)
GFR, Estimated: 30 mL/min — ABNORMAL LOW (ref >60)
Glucose, Bld: 147 mg/dL — ABNORMAL HIGH (ref 70–99)
Potassium: 4.2 mmol/L (ref 3.5–5.1)
Sodium: 136 mmol/L (ref 135–145)

## 2021-11-16 MED ORDER — SODIUM CHLORIDE 0.9 % IV SOLN
2.0000 g | INTRAVENOUS | Status: AC
  Filled 2021-11-16: qty 20

## 2021-11-16 MED ORDER — IOHEXOL 300 MG/ML  SOLN
100.0000 mL | Freq: Once | INTRAMUSCULAR | Status: AC | PRN
  Administered 2021-11-16: 10 mL

## 2021-11-16 MED ORDER — LIDOCAINE HCL (PF) 1 % IJ SOLN
INTRAMUSCULAR | Status: AC | PRN
  Administered 2021-11-16: 10 mL

## 2021-11-16 MED ORDER — CHLORHEXIDINE GLUCONATE CLOTH 2 % EX PADS: 6.0000 | MEDICATED_PAD | Freq: Every day | CUTANEOUS | Status: DC

## 2021-11-16 MED ORDER — DIAZEPAM 5 MG PO TABS: 5.0000 mg | ORAL_TABLET | Freq: Once | ORAL | Status: AC

## 2021-11-16 MED ORDER — LIDOCAINE HCL 1 % IJ SOLN
INTRAMUSCULAR | Status: AC
  Filled 2021-11-16: qty 20

## 2021-11-16 MED ORDER — CEFTRIAXONE SODIUM 2 G IV SOLR
INTRAVENOUS | Status: AC | PRN
  Administered 2021-11-16: 2 g via INTRAVENOUS

## 2021-11-16 MED ORDER — SODIUM CHLORIDE 0.9 % IV SOLN
INTRAVENOUS | Status: AC
  Filled 2021-11-16: qty 20

## 2021-11-16 MED ORDER — FERROUS SULFATE 325 (65 FE) MG PO TABS
325.0000 mg | ORAL_TABLET | Freq: Every day | ORAL | Status: DC
  Administered 2021-11-17: 325 mg via ORAL
  Filled 2021-11-16: qty 1

## 2021-11-16 MED ORDER — FENTANYL CITRATE (PF) 100 MCG/2ML IJ SOLN
INTRAMUSCULAR | Status: AC | PRN
  Administered 2021-11-16 (×2): 25 ug via INTRAVENOUS

## 2021-11-16 MED ORDER — FUROSEMIDE 10 MG/ML IJ SOLN: 40.0000 mg | Freq: Once | INTRAMUSCULAR | Status: DC

## 2021-11-16 MED ORDER — FENTANYL CITRATE (PF) 100 MCG/2ML IJ SOLN
INTRAMUSCULAR | Status: AC
  Filled 2021-11-16: qty 2

## 2021-11-16 MED ORDER — SODIUM CHLORIDE 0.9 % IV SOLN
250.0000 mg | Freq: Once | INTRAVENOUS | Status: AC
  Administered 2021-11-16: 250 mg via INTRAVENOUS
  Filled 2021-11-16: qty 20

## 2021-11-16 MED ORDER — DIAZEPAM 5 MG PO TABS
ORAL_TABLET | ORAL | Status: AC
  Administered 2021-11-16: 5 mg via ORAL
  Filled 2021-11-16: qty 1

## 2021-11-16 MED FILL — Ceftriaxone Sodium For Inj 2 GM: INTRAMUSCULAR | Qty: 20 | Status: AC

## 2021-11-16 NOTE — Progress Notes (Addendum)
Subjective:   Hospital day: 4  Overnight event: Overnight, after patient's urologic procedure, patient did require a non rebreather after anesthesia  and was weaned to  2L [place].      Patient does report that he still short of breath at times.  He denies any chest pain, distended abdomen, or leg swelling.  He does report that he was able to get up out of bed, and transferred to the bedside commode with minimal shortness of breath.  He notes that he is still concerned about his hemoglobin which has been stable.  I also counseled him on iron being the building blocks of blood, therefore it will take some time for his hemoglobin to rebound.  Objective:  Vital signs in last 24 hours: Vitals:   11/16/21 1220 11/16/21 1225 11/16/21 1230 11/16/21 1240  BP: 113/73 125/72 125/70 121/72  Pulse: 82 79 76 88  Resp: (!) 24 (!) 21 (!) 24 20  Temp:      TempSrc:      SpO2: 93% 95% 95% 92%  Weight:      Height:        Filed Weights   11/12/21 2104  Weight: 79.8 kg     Intake/Output Summary (Last 24 hours) at 11/16/2021 1405 Last data filed at 11/16/2021 6834 Gross per 24 hour  Intake 400 ml  Output 1202 ml  Net -802 ml    Net IO Since Admission: -375.5 mL [11/16/21 1405]  No results for input(s): "GLUCAP" in the last 72 hours.   Pertinent Labs:    Latest Ref Rng & Units 11/16/2021    4:22 AM 11/15/2021    3:23 AM 11/14/2021    4:08 AM  CBC  WBC 4.0 - 10.5 K/uL 6.9  7.6  7.9   Hemoglobin 13.0 - 17.0 g/dL 8.4  8.4  8.2   Hematocrit 39.0 - 52.0 % 28.2  27.6  27.6   Platelets 150 - 400 K/uL 123  124  124        Latest Ref Rng & Units 11/16/2021    4:22 AM 11/15/2021    8:54 PM 11/15/2021    3:23 AM  CMP  Glucose 70 - 99 mg/dL 147   86   BUN 8 - 23 mg/dL 27   31   Creatinine 0.61 - 1.24 mg/dL 2.21   2.02   Sodium 135 - 145 mmol/L 136   136   Potassium 3.5 - 5.1 mmol/L 4.2   4.5   Chloride 98 - 111 mmol/L 110   112   CO2 22 - 32 mmol/L 18   18   Calcium 8.9 - 10.3 mg/dL 8.7    8.5   Total Protein 6.5 - 8.1 g/dL  6.1    Total Bilirubin 0.3 - 1.2 mg/dL  0.7    Alkaline Phos 38 - 126 U/L  53    AST 15 - 41 U/L  20    ALT 0 - 44 U/L  21      Imaging: IR NEPHROSTOMY PLACEMENT RIGHT  Result Date: 11/16/2021 INDICATION: 76 year old male with solitary functioning right kidney and hydronephrosis in the setting of obstructing ureteral stones. Ureteral stent placement was unsuccessful. He presents for percutaneous nephrostomy tube placement for renal decompression. EXAM: IR NEPHROSTOMY PLACEMENT RIGHT COMPARISON:  None Available. MEDICATIONS: 2 g Rocephin; The antibiotic was administered in an appropriate time frame prior to skin puncture. ANESTHESIA/SEDATION: Fentanyl 50 mcg IV; 5 mg Valium p.o. Moderate Sedation Time:  13 minutes  The patient was continuously monitored during the procedure by the interventional radiology nurse under my direct supervision. CONTRAST:  64m OMNIPAQUE IOHEXOL 300 MG/ML SOLN - administered into the collecting system(s) FLUOROSCOPY: Radiation exposure index: 3 mGy reference air kerma COMPLICATIONS: None immediate. TECHNIQUE: The procedure, risks, benefits, and alternatives were explained to the patient. Questions regarding the procedure were encouraged and answered. The patient understands and consents to the procedure. The right flank was prepped with chlorhexidine in a sterile fashion, and a sterile drape was applied covering the operative field. A sterile gown and sterile gloves were used for the procedure. Local anesthesia was provided with 1% Lidocaine. The right flank was interrogated with ultrasound and the left kidney identified. The kidney is hydronephrotic. A suitable access site on the skin overlying the lower pole, posterior calix was identified. After local mg anesthesia was achieved, a small skin nick was made with an 11 blade scalpel. A 21 gauge Accustick needle was then advanced under direct sonographic guidance into the lower pole of the right  kidney. A 0.018 inch wire was advanced under fluoroscopic guidance into the left renal collecting system. The Accustick sheath was then advanced over the wire and a 0.018 system exchanged for a 0.035 system. Gentle hand injection of contrast material confirms placement of the sheath within the renal collecting system. There is significant hydronephrosis. The tract from the scan into the renal collecting system was then dilated serially to 10-French. A 10-French Cook all-purpose drain was then placed and positioned under fluoroscopic guidance. The locking loop is well formed within the left renal pelvis. The catheter was secured to the skin with 2-0 Prolene and a sterile bandage was placed. Catheter was left to gravity bag drainage. IMPRESSION: Successful placement of a right 10 French percutaneous nephrostomy tube. Electronically Signed   By: HJacqulynn CadetM.D.   On: 11/16/2021 13:14   DG C-Arm 1-60 Min  Result Date: 11/15/2021 CLINICAL DATA:  Cystoscopy with retrograde pyelogram. EXAM: DG C-ARM 1-60 MIN FLUOROSCOPY: Radiation Exposure Index (if provided by the fluoroscopic device): 37.8 mGy COMPARISON:  None Available. FINDINGS: Three images demonstrate contrast in the right distal ureter. Two of the images show a wire in the right distal ureter. IMPRESSION: 1. Contrast injection of the right ureter and passage of a wire in the right distal ureter. Electronically Signed   By: WVan ClinesM.D.   On: 11/15/2021 19:31   DG CHEST PORT 1 VIEW  Result Date: 11/15/2021 CLINICAL DATA:  Hypoxemia.  Non-Hodgkin's lymphoma. EXAM: PORTABLE CHEST 1 VIEW COMPARISON:  Overlapping portion abdomen CT from 11/13/2021 FINDINGS: Layering bilateral pleural effusions and associated passive atelectasis. Atherosclerotic calcification of the aortic arch. Heart size within normal limits. Difficult to completely exclude the possibility of superimposed airspace opacity beyond the pleural effusions and passive atelectasis,  especially in the left lower lobe-cannot exclude left lower lobe pneumonia. IMPRESSION: 1. Layering pleural effusions with passive atelectasis. 2. Left lower lobe airspace opacity, cannot exclude pneumonia. 3.  Aortic Atherosclerosis (ICD10-I70.0). Electronically Signed   By: WVan ClinesM.D.   On: 11/15/2021 19:29    Physical Exam  General: Pleasant, well-appearing elderly man laying in bed. No acute distress. HEENT: No lymphadenopathy CV: RRR. No m/r/g. No LE edema Pulmonary: Normal effort.  Decreased lung sounds noted to left lung with crackles noted to left lung base.  Right side unremarkable. Abdominal: Soft, nontender, nondistended Extremities: 2+ pedal pulses.  Trace edema noted to bilateral ankles much improved from previous day  Skin: Warm  and dry. No obvious rash or lesions.  Assessment/Plan: Jack Fuentes is a 76 y.o. 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  Principal Problem:   Symptomatic anemia Active Problems:   Acute renal failure superimposed on stage 4 chronic kidney disease (HCC)   AV block, Mobitz 2   #Abnormal CT findings #Moderate pleural effusion #Hx of diffuse large B-cell lymphoma CT A/P 11/13/2021 showed findings concerning for nonspecific enteritis, mod-severe right-sided hydronephrosis, gallbladder wall thickening with surrounding inflammation and moderate-sized bilateral pleural effusions. His findings could likely be secondary to postoperative changes versus recurrence of his lymphoma. He has no abdominal or urinary symptoms including hematuria. Last PET/CT scan in May 2021 showed stable postoperative changes of abdominal mass and bowel resection, hepatic steatosis, atrophic left kidney with persistent moderate to severe hydronephrosis,  nonobstructive right-sided nephrolithiasis, 9 mm.  His new bilateral pleural effusion could likely be secondary to malignancy however patient has no B symptoms and his respiratory status remained stable.  After surgery yesterday, patient did require oxygen, which has been weaned down to 2 L nasal cannula.  After diuresing, patient is still short of breath, will need to further evaluate for etiology of this pleural effusion.  -Noncontrast CT chest pending -Closely monitor for any new symptoms -Hold diuresis  #Symptomatic anemia #Iron deficiency anemia  Continues to feel fatigue but this has improved since admission.  Patient is still reporting shortness of breath, but does state it is getting better. Denies any chest pain, leg swelling, lightheadedness, or dizziness. Hemoglobin 8.4 this morning. Patient has no signs of active bleed. His anemia likely secondary to nutritional deficiency from poor absorption in the setting of his small bowel resection. Normal blood smear. CT A/P did not show any evidence of intra abdominal bleed.  Given that hemoglobin is stable, and patient still having shortness of breath, this could be symptomatic anemia, versus related to his pleural effusion.  Of note, after further research, turmeric can contribute to decreased iron absorption.  This could be contributing to his anemia, as the patient takes turmeric daily. -Continue IV Ferrlecit 250 mg daily (this is day 4) discontinue IV Ferrlecit tomorrow, and transition to p.o. ferrous sulfate. -Avoid turmeric -Continue to hold Plavix -Trend CBC -Outpatient GI follow-up for colonoscopy  #Suspected AKI, unclear baseline #Right kidney hydronephrosis CT abdomen pelvis on 11/13/2021 showing moderate/severe right-sided hydronephrosis and severe left renal atrophy.  Kidney function worsened over night with creatinine at 2.21 this morning up from 2.02. Unclear baseline although normal Cr in 2020. Concerned that AKI is secondary to  obstruction from right ureteral stones with atrophic left kidney.  Urology was unsuccessful at stenting, and patient referred to IR for nephrostomy tube placement. -IR to place right nephrostomy tube today -Urology to follow-up outpatient to remove kidney stones -Trend BMP -Monitor urine output  #Chronic systolic heart failure Last TTE in 2020 showed an EF 45-50% plan to repeat TTE on admission shows improvement in EF to 55-60%, G2DD, moderate PASP and RAP of 8 mmHg. He had a UOP 0.8 L in the last 24 hours after dose of Lasix.  Patient is euvolemic on exam today.  -Hold diuresis -CTM -Strict I&O -Telemetry  #Second-degree AV block (Mobitz type I) EKG on admission showing second-degree AV block Mobitz type II.  Patient is thought to have some underlying conduction  disease.  Heart rate has improved to the 70s with improvement in anemia.  Per EP, patient second degree heart block seem to resolve after the patient's urologic procedure, suggesting vagal involvement, intranodal block (Mobitz I).  Patient denies any fatigue or dizziness at this time. -Cardiology following, appreciate recs -Pending EP eval, who plan to discharge patient on MCOT -Continue telemetry -Monitor and replete electrolytes  History of CAD? Patient reports a history of abnormal stress test years ago and calcifications found on CT scan prompting his cardiologist to start him on Plavix.  Denies any history of ACS or CVA. Holding Plavix in the setting of symptomatic anemia. 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 -Continue Neurontin to 300 mg twice daily   Diet: Heart Healthy IVF: N/A VTE: SCDs Code: Full  Prior to Admission Living Arrangement: Home Anticipated Discharge Location: Home Barriers to Discharge: Medical stability Dispo: Anticipated discharge in approximately 1-2 day(s).   SignedLeigh Aurora, DO 11/16/2021, 2:05 PM   Pager: [phone] Internal Medicine Teaching Service After 5pm on weekdays and 1pm on weekends: On Call pager: [phone]

## 2021-11-16 NOTE — Progress Notes (Signed)
Rounding Note    Patient Name: Jack Fuentes Date of Encounter: 11/16/2021  [place] Cardiologist: Freada Bergeron, MD   Subjective   No CP, no SOB  Inpatient Medications    Scheduled Meds:  furosemide  40 mg Oral Daily   gabapentin  300 mg Oral BID   multivitamin with minerals  1 tablet Oral Daily   tamsulosin  0.4 mg Oral Daily   Continuous Infusions:  ferric gluconate (FERRLECIT) IVPB     PRN Meds: acetaminophen **OR** acetaminophen, ondansetron **OR** ondansetron (ZOFRAN) IV, mouth rinse, senna-docusate   Vital Signs    Vitals:   11/15/21 1930 11/15/21 2014 11/15/21 2015 11/16/21 0353  BP: 121/72 125/72  107/65  Pulse: 78 78  75  Resp: (!) 23 (!) 24  20  Temp: (!) 97.5 F (36.4 C) (!) 97.5 F (36.4 C)  (!) 97.4 F (36.3 C)  TempSrc:  Oral  Oral  SpO2: 92% 91% 95% 93%  Weight:      Height:        Intake/Output Summary (Last 24 hours) at 11/16/2021 0739 Last data filed at 11/16/2021 0626 Gross per 24 hour  Intake 400 ml  Output 1202 ml  Net -802 ml      11/12/2021    9:04 PM 08/08/2018    2:23 PM 08/06/2018   11:33 AM  Last 3 Weights  Weight (lbs) 176 lb 145 lb 14.4 oz 145 lb  Weight (kg) 79.833 kg 66.18 kg 65.772 kg      Telemetry    SR 80's - Personally Reviewed  ECG    No new EKGs - Personally Reviewed  Physical Exam   GEN: No acute distress.   Neck: No JVD Cardiac: RRR, no murmurs, rubs, or gallops.  Respiratory: soft crackles at the bases b/l. GI: Soft, nontender, non-distended  MS: No edema; No deformity. Neuro:  Nonfocal  Psych: Normal affect   Labs    High Sensitivity Troponin:   Recent Labs  Lab 11/12/21 1259 11/12/21 1623  TROPONINIHS 23* 21*     Chemistry Recent Labs  Lab 11/12/21 1259 11/13/21 0232 11/14/21 0408 11/15/21 0323 11/15/21 2054 11/16/21 0422  NA 141   < > 136 136  --  136  K 5.0   < > 4.2 4.5  --  4.2  CL 114*   < > 110 112*  --  110  CO2 19*   < > 18* 18*  --  18*  GLUCOSE  103*   < > 88 86  --  147*  BUN 34*   < > 35* 31*  --  27*  CREATININE 2.44*   < > 2.16* 2.02*  --  2.21*  CALCIUM 8.8*   < > 8.4* 8.5*  --  8.7*  MG  --   --   --  1.9  --   --   PROT 6.2*  --   --   --  6.1*  --   ALBUMIN 3.3*  --   --   --  3.1*  --   AST 22  --   --   --  20  --   ALT 27  --   --   --  21  --   ALKPHOS 50  --   --   --  53  --   BILITOT 0.4  --   --   --  0.7  --   GFRNONAA 27*   < >  31* 34*  --  30*  ANIONGAP 8   < > 8 6  --  8   < > = values in this interval not displayed.    Lipids No results for input(s): "CHOL", "TRIG", "HDL", "LABVLDL", "LDLCALC", "CHOLHDL" in the last 168 hours.  Hematology Recent Labs  Lab 11/14/21 0408 11/15/21 0323 11/16/21 0422  WBC 7.9 7.6 6.9  RBC 3.36* 3.38* 3.38*  HGB 8.2* 8.4* 8.4*  HCT 27.6* 27.6* 28.2*  MCV 82.1 81.7 83.4  MCH 24.4* 24.9* 24.9*  MCHC 29.7* 30.4 29.8*  RDW 18.5* 19.0* 19.6*  PLT 124* 124* 123*   Thyroid  Recent Labs  Lab 11/15/21 0323  TSH 5.103*    BNPNo results for input(s): "BNP", "PROBNP" in the last 168 hours.  DDimer No results for input(s): "DDIMER" in the last 168 hours.   Radiology    DG CHEST PORT 1 VIEW Result Date: 11/15/2021 CLINICAL DATA:  Hypoxemia.  Non-Hodgkin's lymphoma. EXAM: PORTABLE CHEST 1 VIEW COMPARISON:  Overlapping portion abdomen CT from 11/13/2021 FINDINGS: Layering bilateral pleural effusions and associated passive atelectasis. Atherosclerotic calcification of the aortic arch. Heart size within normal limits. Difficult to completely exclude the possibility of superimposed airspace opacity beyond the pleural effusions and passive atelectasis, especially in the left lower lobe-cannot exclude left lower lobe pneumonia. IMPRESSION: 1. Layering pleural effusions with passive atelectasis. 2. Left lower lobe airspace opacity, cannot exclude pneumonia. 3.  Aortic Atherosclerosis (ICD10-I70.0). Electronically Signed   By: Van Clines M.D.   On: 11/15/2021 19:29     CT  ABDOMEN PELVIS WO CONTRAST Addendum Date: 11/13/2021   ADDENDUM REPORT: 11/13/2021 20:42 ADDENDUM: These results were called by telephone at the time of interpretation on 11/13/2021 at 8:38 pm to provider Dr. Jodell Cipro , who verbally acknowledged these results. Electronically Signed   By: Ronney Asters M.D.   On: 11/13/2021 20:42   Result Date: 11/13/2021 CLINICAL DATA:  Concern for recurrence of non-Hodgkin's lymphoma. Patient is status post small bowel resection. EXAM: CT ABDOMEN AND PELVIS WITHOUT CONTRAST TECHNIQUE: Multidetector CT imaging of the abdomen and pelvis was performed following the standard protocol without IV contrast. RADIATION DOSE REDUCTION: This exam was performed according to the departmental dose-optimization program which includes automated exposure control, adjustment of the mA and/or kV according to patient size and/or use of iterative reconstruction technique. COMPARISON:  CT abdomen and pelvis 06/29/2018 FINDINGS: Lower chest: There are moderate-sized bilateral pleural effusions with compressive atelectasis in the bilateral lower lobes. Hepatobiliary: There is diffuse gallbladder wall thickening with surrounding inflammation. No gallstones are seen. No biliary ductal dilatation. The liver is within normal limits. Pancreas: Unremarkable. No pancreatic ductal dilatation or surrounding inflammatory changes. Spleen: Normal in size without focal abnormality. Adrenals/Urinary Tract: Punctate calcifications in the right adrenal gland are unchanged likely related to prior infection or hemorrhage. Left adrenal gland is within normal limits. Severe left renal atrophy is unchanged. There are 2 calculi in the proximal left ureter. The more distal measures 9 mm in the more proximal measures 3 mm. There is resultant moderate/severe right-sided hydronephrosis. There are additional nonobstructing right renal calculi measuring up to 6 mm. There is a 3 mm calculus in the bladder. The bladder is otherwise  within normal limits. Stomach/Bowel: Patient is status post small bowel resection. Anastomosis is seen in the left mid abdomen. Small bowel loops just proximal to this level demonstrate wall thickening with mesenteric edema. There is no evidence for bowel obstruction or free air. No pneumatosis identified. The  appendix is within normal limits. The stomach is within normal limits. Vascular/Lymphatic: Aortic atherosclerosis. No enlarged abdominal or pelvic lymph nodes. Reproductive: Prostate gland is enlarged, unchanged. Other: Fat containing inguinal hernias are again noted. There is a small amount of free fluid in the pelvis. There is mild body wall edema. Musculoskeletal: No acute or significant osseous findings. IMPRESSION: 1. Patient is status post small bowel resection. Small bowel loops proximal to the anastomosis demonstrate wall thickening and mesenteric edema worrisome for nonspecific enteritis including infectious, inflammatory and ischemic etiologies. 2. There are 2 calculi within the mid right ureter measuring up to 9 mm. There is moderate/severe right-sided hydronephrosis. 3. Gallbladder wall thickening with surrounding inflammation concerning for cholecystitis. 4. Small volume ascites. 5. Bladder calculi and right renal calculi. 6. Moderate-sized bilateral pleural effusions. 7.  Aortic Atherosclerosis (ICD10-I70.0). Electronically Signed: By: Ronney Asters M.D. On: 11/13/2021 20:17     Cardiac Studies   11/13/21: TTE 1. Left ventricular ejection fraction, by estimation, is 55 to 60%. The  left ventricle has normal function. The left ventricle has no regional  wall motion abnormalities. Left ventricular diastolic parameters are  consistent with Grade II diastolic  dysfunction (pseudonormalization). Elevated left atrial pressure.   2. Right ventricular systolic function is normal. The right ventricular  size is normal. There is moderately elevated pulmonary artery systolic  pressure.   3. Left  atrial size was mild to moderately dilated.   4. The mitral valve is abnormal. Moderate mitral valve regurgitation. No  evidence of mitral stenosis.   5. The tricuspid valve is abnormal. Tricuspid valve regurgitation is mild  to moderate.   6. The aortic valve was not well visualized. There is mild calcification  of the aortic valve. There is mild thickening of the aortic valve. Aortic  valve regurgitation is mild to moderate. No aortic stenosis is present.   7. The inferior vena cava is dilated in size with >50% respiratory  variability, suggesting right atrial pressure of 8 mmHg.    TTE in 2020 showed LVEF 45-50% with global hypokinesis (although limited visualization), normal RV, mild AR, GLS-11.9%  Patient Profile     76 y.o. male w/PMHx of  NHL had chemo/XRT (2001), HLD, w/hx of SBO and resection admitted with progressive weakness, DOE, exertional weakness/dizziness.  He was found with profound anemia and intermittent Mobitz II AV block with 2:1 conduction without significant bradycardia  No known CAD but pt reported:  being remotely seen by [facility] clinic for a cardiac work-up. He reports he underwent an exercise nuclear stress test where he was told he had an "inferior scar" with evidence of a "prior heart attack. Started on Plavix for this apparently He also reports hx of LBBB   Assessment & Plan    Advanced heart block, Mobitz II w/2:1 conduction rates 40's Baseline conduction system disease RBBB, LAD His problem list mentions LBBB, I have been able to only find RBBB in our system and care everywhere  Only mild TSH elevation (deferred to IM) On no nodal blocking agents  Post procedure he has maintained SR with 1:1 conduction in the 80's ?vagal  For now will plan to avoid PPM particularly with what looks like plans for nephrostomy tube Please avoid all av nodal blocking agents  Will order a Zio AT to be placed at discharge and will make EP follow up.  3. Profound  Anemia Appears acute This is most likely where his symptoms came from He had symptom improvement with transfusion S/p [facility]  IM is addressing   4. R hydronephrosis 5. AKI Urology is on board Unclear what his current baseline Creat is (we only have old data) " mild/moderate hydronephrosis tracking down to 2 proximal ureteral calcifications the largest of which is around 6 or 7 mm.  The left kidney is present but severely atrophic and likely nonfunctioning S/p cystoscopy yesterday unable to get a stent in. Pending IR today for possible/probable nephrostomy tube , ? Antegrade stent Reports hematuria post procedure  6. Abn CT abdomen Looks like plans for out pt GI   7. Volume OL Pleural effusions on CXR yesterday Some crackles at the bases today still + urine OP On O2 this AM Cardiology is following    EP will follow from [place], plan monitor placement day of discharge Continue diuresis, volume management with gen cards team and IM   For questions or updates, please contact [place] Please consult [email or web address] for contact info under        Signed, Baldwin [place], PA-C  11/16/2021, 7:39 AM

## 2021-11-16 NOTE — Plan of Care (Signed)

## 2021-11-16 NOTE — Progress Notes (Signed)
Pt states that he has taken benzodiazapine medication without issues.

## 2021-11-16 NOTE — Consult Note (Signed)
Chief Complaint: Patient was seen in consultation today for  right percutaneous nephrostomy placement Chief Complaint  Patient presents with   Anemia   at the request of Dr York Spaniel  Supervising Physician: Jacqulynn Cadet  Patient Status: [facility] - In-pt  History of Present Illness: Jack Fuentes is a 76 y.o. male   Hx small bowel obstruction secondary malignancy - non Hodgkin's Lymphoma HLD; known anemia Admitted 9/8 with SOB and anemia Acute kidney injury: Cr 2.44 on admission Solitary kidney--  LD Plavix 11/05/21  CT was performed upon admission due to NHL and sm bowel resection history IMPRESSION: 1. Patient is status post small bowel resection. Small bowel loops proximal to the anastomosis demonstrate wall thickening and mesenteric edema worrisome for nonspecific enteritis including infectious, inflammatory and ischemic etiologies. 2. There are 2 calculi within the mid right ureter measuring up to 9 mm. There is moderate/severe right-sided hydronephrosis. 3. Gallbladder wall thickening with surrounding inflammation concerning for cholecystitis. 4. Small volume ascites. 5. Bladder calculi and right renal calculi. 6. Moderate-sized bilateral pleural effusions. 7.  Aortic Atherosclerosis (ICD10-I70.0).  Was seen by Urology and taken to OR 9/11 Intraoperative findings:   Cystoscopy demonstrated no suspicious lesions, masses, stones or other pathology. Right Retrograde pyelogram demonstrated right ureteral obstruction - no contrast passed by the stone on the right. I was not able to place a stent after trying multiple ureteral catheters and wire types. Plan:   Recommend IR placement of nephrostomy tube [facility]   Request made for percutaneous nephrostomy drain placement per Urology Dr Laurence Ferrari has reviewed imaging and approves procedure   Past Medical History:  Diagnosis Date   Cancer [facility])    lymphoma-large B-cell   History of  left bundle branch block (LBBB)    Hyperlipidemia    Peripheral neuropathy    SBO (small bowel obstruction) ([place]) 06/2018    Past Surgical History:  Procedure Laterality Date   CYSTOSCOPY W/ URETERAL STENT PLACEMENT Right 11/15/2021   Procedure: CYSTOSCOPY WITH RETROGRADE PYELOGRAM;  Surgeon: Vira Agar, MD;  Location: [place];  Service: Urology;  Laterality: Right;   EYE SURGERY     IR PATIENT EVAL TECH 0-60 MINS  08/01/2018   LAPAROTOMY N/A 07/03/2018   Procedure: EXPLORATORY LAPAROTOMY, LYSIS OF ADHESION, BOWEL RESECTION, RESECTION MESTASTATIC TUMOR;  Surgeon: Jovita Kussmaul, MD;  Location: [place];  Service: General;  Laterality: N/A;   NASAL FRACTURE SURGERY      Allergies: Levofloxacin and Midazolam  Medications: Prior to Admission medications   Medication Sig Start Date End Date Taking? Authorizing Provider  acetaminophen (TYLENOL) 325 MG tablet Take 2 tablets (650 mg total) by mouth every 6 (six) hours. Patient taking differently: Take 650 mg by mouth every 6 (six) hours as needed for moderate pain. 07/13/18  Yes Isabelle Course, MD  gabapentin (NEURONTIN) 600 MG tablet Take 600 mg by mouth 2 (two) times daily.  04/18/18  Yes [provider]  Multiple Vitamin (MULTIVITAMIN WITH MINERALS) TABS tablet Take 1 tablet by mouth daily. 07/14/18  Yes Isabelle Course, MD  Turmeric 500 MG CAPS Take 500 mg by mouth 2 (two) times daily.   Yes [provider]  valsartan (DIOVAN) 40 MG tablet Take 40 mg by mouth daily as needed (For blood pressure). 10/15/21  Yes [provider]  clopidogrel (PLAVIX) 75 MG tablet Take 75 mg by mouth daily. Patient not taking: Reported on 11/12/2021 04/18/18   [provider]  methocarbamol (ROBAXIN) 500 MG tablet Take 1 tablet (500 mg total) by mouth every 8 (eight) hours as needed for muscle spasms. Patient not taking: Reported on 11/12/2021 07/13/18   Isabelle Course, MD  rosuvastatin (CRESTOR) 10 MG tablet Take 10 mg by mouth  daily. Patient not taking: Reported on 11/12/2021 04/18/18   [provider]  saccharomyces boulardii (DAILY PROBIOTIC SUPPLEMENT) 250 MG capsule Take 250 mg by mouth 2 (two) times daily. FOR SUPPLEMENT(DO NOT CRUSH) Patient not taking: Reported on 11/12/2021    [provider]     Family History  Problem Relation Age of Onset   Heart disease Father    Multiple myeloma Sister     Social History   Socioeconomic History   Marital status: Divorced    Spouse name: Not on file   Number of children: Not on file   Years of education: Not on file   Highest education level: Not on file  Occupational History   Not on file  Tobacco Use   Smoking status: Never   Smokeless tobacco: Never  Vaping Use   Vaping Use: Never used  Substance and Sexual Activity   Alcohol use: Not Currently   Drug use: Never   Sexual activity: Not on file  Other Topics Concern   Not on file  Social History Narrative   Not on file   Social Determinants of Health   Financial Resource Strain: Not on file  Food Insecurity: Not on file  Transportation Needs: Not on file  Physical Activity: Not on file  Stress: Not on file  Social Connections: Not on file    Review of Systems: A 12 point ROS discussed and pertinent positives are indicated in the HPI above.  All other systems are negative.  Review of Systems  Constitutional:  Positive for activity change. Negative for fatigue and fever.  Respiratory:  Negative for cough and shortness of breath.   Cardiovascular:  Negative for chest pain.  Gastrointestinal:  Negative for abdominal pain.  Neurological:  Negative for weakness.  Psychiatric/Behavioral:  Negative for behavioral problems and confusion.     Vital Signs: BP 107/65 (BP Location: Left Arm)   Pulse 75   Temp (!) 97.4 F (36.3 C) (Oral)   Resp 20   Ht '5\' 8"'  (1.727 m)   Wt 176 lb (79.8 kg)   SpO2 93%   BMI 26.76 kg/m     Physical Exam Vitals reviewed.  HENT:      Mouth/Throat:     Mouth: Mucous membranes are moist.  Cardiovascular:     Rate and Rhythm: Normal rate and regular rhythm.     Heart sounds: Normal heart sounds.  Pulmonary:     Effort: Pulmonary effort is normal.     Breath sounds: Normal breath sounds.  Abdominal:     Palpations: Abdomen is soft.  Musculoskeletal:        General: Normal range of motion.  Skin:    General: Skin is warm.  Neurological:     Mental Status: He is alert and oriented to person, place, and time.  Psychiatric:        Behavior: Behavior normal.     Imaging: DG C-Arm 1-60 Min  Result Date: 11/15/2021 CLINICAL DATA:  Cystoscopy with retrograde pyelogram. EXAM: DG C-ARM 1-60 MIN FLUOROSCOPY: Radiation Exposure Index (if provided by the fluoroscopic device): 37.8 mGy COMPARISON:  None Available. FINDINGS: Three images demonstrate contrast in the right distal ureter. Two of the images show a  wire in the right distal ureter. IMPRESSION: 1. Contrast injection of the right ureter and passage of a wire in the right distal ureter. Electronically Signed   By: Van Clines M.D.   On: 11/15/2021 19:31   DG CHEST PORT 1 VIEW  Result Date: 11/15/2021 CLINICAL DATA:  Hypoxemia.  Non-Hodgkin's lymphoma. EXAM: PORTABLE CHEST 1 VIEW COMPARISON:  Overlapping portion abdomen CT from 11/13/2021 FINDINGS: Layering bilateral pleural effusions and associated passive atelectasis. Atherosclerotic calcification of the aortic arch. Heart size within normal limits. Difficult to completely exclude the possibility of superimposed airspace opacity beyond the pleural effusions and passive atelectasis, especially in the left lower lobe-cannot exclude left lower lobe pneumonia. IMPRESSION: 1. Layering pleural effusions with passive atelectasis. 2. Left lower lobe airspace opacity, cannot exclude pneumonia. 3.  Aortic Atherosclerosis (ICD10-I70.0). Electronically Signed   By: Van Clines M.D.   On: 11/15/2021 19:29   CT ABDOMEN PELVIS  WO CONTRAST  Addendum Date: 11/13/2021   ADDENDUM REPORT: 11/13/2021 20:42 ADDENDUM: These results were called by telephone at the time of interpretation on 11/13/2021 at 8:38 pm to provider Dr. Jodell Cipro , who verbally acknowledged these results. Electronically Signed   By: Ronney Asters M.D.   On: 11/13/2021 20:42   Result Date: 11/13/2021 CLINICAL DATA:  Concern for recurrence of non-Hodgkin's lymphoma. Patient is status post small bowel resection. EXAM: CT ABDOMEN AND PELVIS WITHOUT CONTRAST TECHNIQUE: Multidetector CT imaging of the abdomen and pelvis was performed following the standard protocol without IV contrast. RADIATION DOSE REDUCTION: This exam was performed according to the departmental dose-optimization program which includes automated exposure control, adjustment of the mA and/or kV according to patient size and/or use of iterative reconstruction technique. COMPARISON:  CT abdomen and pelvis 06/29/2018 FINDINGS: Lower chest: There are moderate-sized bilateral pleural effusions with compressive atelectasis in the bilateral lower lobes. Hepatobiliary: There is diffuse gallbladder wall thickening with surrounding inflammation. No gallstones are seen. No biliary ductal dilatation. The liver is within normal limits. Pancreas: Unremarkable. No pancreatic ductal dilatation or surrounding inflammatory changes. Spleen: Normal in size without focal abnormality. Adrenals/Urinary Tract: Punctate calcifications in the right adrenal gland are unchanged likely related to prior infection or hemorrhage. Left adrenal gland is within normal limits. Severe left renal atrophy is unchanged. There are 2 calculi in the proximal left ureter. The more distal measures 9 mm in the more proximal measures 3 mm. There is resultant moderate/severe right-sided hydronephrosis. There are additional nonobstructing right renal calculi measuring up to 6 mm. There is a 3 mm calculus in the bladder. The bladder is otherwise within normal  limits. Stomach/Bowel: Patient is status post small bowel resection. Anastomosis is seen in the left mid abdomen. Small bowel loops just proximal to this level demonstrate wall thickening with mesenteric edema. There is no evidence for bowel obstruction or free air. No pneumatosis identified. The appendix is within normal limits. The stomach is within normal limits. Vascular/Lymphatic: Aortic atherosclerosis. No enlarged abdominal or pelvic lymph nodes. Reproductive: Prostate gland is enlarged, unchanged. Other: Fat containing inguinal hernias are again noted. There is a small amount of free fluid in the pelvis. There is mild body wall edema. Musculoskeletal: No acute or significant osseous findings. IMPRESSION: 1. Patient is status post small bowel resection. Small bowel loops proximal to the anastomosis demonstrate wall thickening and mesenteric edema worrisome for nonspecific enteritis including infectious, inflammatory and ischemic etiologies. 2. There are 2 calculi within the mid right ureter measuring up to 9 mm. There is moderate/severe right-sided hydronephrosis.  3. Gallbladder wall thickening with surrounding inflammation concerning for cholecystitis. 4. Small volume ascites. 5. Bladder calculi and right renal calculi. 6. Moderate-sized bilateral pleural effusions. 7.  Aortic Atherosclerosis (ICD10-I70.0). Electronically Signed: By: Ronney Asters M.D. On: 11/13/2021 20:17   ECHOCARDIOGRAM COMPLETE  Result Date: 11/13/2021    ECHOCARDIOGRAM REPORT   Patient Name:   ARCENIO MULLALY Date of Exam: 11/13/2021 Medical Rec #:  [id number]         Height:       68.0 in Accession #:    [id number]        Weight:       176.0 lb Date of Birth:  1946-01-22         BSA:          1.936 m Patient Age:    63 years          BP:           112/66 mmHg Patient Gender: M                 HR:           74 bpm. Exam Location:  Inpatient Procedure: 2D Echo, Color Doppler and Cardiac Doppler Indications:    Heart block, 2nd degree   History:        Patient has prior history of Echocardiogram examinations, most                 recent 07/31/2018. Arrythmias:LBBB; Risk Factors:Dyslipidemia.  Sonographer:    Memory Argue Referring Phys: [place]  1. Left ventricular ejection fraction, by estimation, is 55 to 60%. The left ventricle has normal function. The left ventricle has no regional wall motion abnormalities. Left ventricular diastolic parameters are consistent with Grade II diastolic dysfunction (pseudonormalization). Elevated left atrial pressure.  2. Right ventricular systolic function is normal. The right ventricular size is normal. There is moderately elevated pulmonary artery systolic pressure.  3. Left atrial size was mild to moderately dilated.  4. The mitral valve is abnormal. Moderate mitral valve regurgitation. No evidence of mitral stenosis.  5. The tricuspid valve is abnormal. Tricuspid valve regurgitation is mild to moderate.  6. The aortic valve was not well visualized. There is mild calcification of the aortic valve. There is mild thickening of the aortic valve. Aortic valve regurgitation is mild to moderate. No aortic stenosis is present.  7. The inferior vena cava is dilated in size with >50% respiratory variability, suggesting right atrial pressure of 8 mmHg. FINDINGS  Left Ventricle: Left ventricular ejection fraction, by estimation, is 55 to 60%. The left ventricle has normal function. The left ventricle has no regional wall motion abnormalities. The left ventricular internal cavity size was normal in size. There is  no left ventricular hypertrophy. Left ventricular diastolic parameters are consistent with Grade II diastolic dysfunction (pseudonormalization). Elevated left atrial pressure. Right Ventricle: The right ventricular size is normal. Right vetricular wall thickness was not well visualized. Right ventricular systolic function is normal. There is moderately elevated pulmonary artery systolic  pressure. The tricuspid regurgitant velocity is 3.27 m/s, and with an assumed right atrial pressure of 8 mmHg, the estimated right ventricular systolic pressure is 61.6 mmHg. Left Atrium: Left atrial size was mild to moderately dilated. Right Atrium: Right atrial size was normal in size. Pericardium: There is no evidence of pericardial effusion. Mitral Valve: The mitral valve is abnormal. There is mild thickening of the mitral valve leaflet(s). There is mild calcification of  the mitral valve leaflet(s). Mild mitral annular calcification. Moderate mitral valve regurgitation. No evidence of mitral  valve stenosis. MV peak gradient, 8.1 mmHg. The mean mitral valve gradient is 4.0 mmHg. Tricuspid Valve: The tricuspid valve is abnormal. Tricuspid valve regurgitation is mild to moderate. No evidence of tricuspid stenosis. Aortic Valve: The aortic valve was not well visualized. There is mild calcification of the aortic valve. There is mild thickening of the aortic valve. There is mild aortic valve annular calcification. Aortic valve regurgitation is mild to moderate. Aortic regurgitation PHT measures 423 msec. No aortic stenosis is present. Aortic valve mean gradient measures 5.0 mmHg. Aortic valve peak gradient measures 8.9 mmHg. Aortic valve area, by VTI measures 2.44 cm. Pulmonic Valve: The pulmonic valve was not well visualized. Pulmonic valve regurgitation is not visualized. No evidence of pulmonic stenosis. Aorta: The aortic root is normal in size and structure. Venous: The inferior vena cava is dilated in size with greater than 50% respiratory variability, suggesting right atrial pressure of 8 mmHg. IAS/Shunts: No atrial level shunt detected by color flow Doppler.  LEFT VENTRICLE PLAX 2D LVIDd:         4.20 cm   Diastology LVIDs:         3.00 cm   LV e' medial:    5.55 cm/s LV PW:         0.90 cm   LV E/e' medial:  24.9 LV IVS:        0.90 cm   LV e' lateral:   9.68 cm/s LVOT diam:     2.20 cm   LV E/e' lateral:  14.3 LV SV:         73 LV SV Index:   38 LVOT Area:     3.80 cm  RIGHT VENTRICLE RV S prime:     11.10 cm/s TAPSE (M-mode): 1.9 cm LEFT ATRIUM             Index        RIGHT ATRIUM           Index LA diam:        3.50 cm 1.81 cm/m   RA Area:     14.40 cm LA Vol (A2C):   69.8 ml 36.06 ml/m  RA Volume:   31.80 ml  16.43 ml/m LA Vol (A4C):   91.9 ml 47.48 ml/m LA Biplane Vol: 80.8 ml 41.74 ml/m  AORTIC VALVE AV Area (Vmax):    2.47 cm AV Area (Vmean):   2.39 cm AV Area (VTI):     2.44 cm AV Vmax:           149.00 cm/s AV Vmean:          101.000 cm/s AV VTI:            0.301 m AV Peak Grad:      8.9 mmHg AV Mean Grad:      5.0 mmHg LVOT Vmax:         97.00 cm/s LVOT Vmean:        63.400 cm/s LVOT VTI:          0.193 m LVOT/AV VTI ratio: 0.64 AI PHT:            423 msec  AORTA Ao Root diam: 3.30 cm MITRAL VALVE                 TRICUSPID VALVE MV Area (PHT): 4.80 cm      TR Peak grad:   42.8  mmHg MV Area VTI:   2.08 cm      TR Vmax:        327.00 cm/s MV Peak grad:  8.1 mmHg MV Mean grad:  4.0 mmHg      SHUNTS MV Vmax:       1.42 m/s      Systemic VTI:  0.19 m MV Vmean:      93.0 cm/s     Systemic Diam: 2.20 cm MV Decel Time: 158 msec MR Peak grad:   71.0 mmHg MR Mean grad:   56.0 mmHg MR Vmax:        421.40 cm/s MR Vmean:       352.0 cm/s MR PISA:        0.57 cm MR PISA Radius: 0.30 cm MV E velocity: 138.00 cm/s MV A velocity: 72.40 cm/s MV E/A ratio:  1.91 Carlyle Dolly MD Electronically signed by Carlyle Dolly MD Signature Date/Time: 11/13/2021/1:31:52 PM    Final     Labs:  CBC: Recent Labs    11/13/21 1026 11/13/21 2009 11/14/21 0408 11/15/21 0323 11/16/21 0422  WBC 9.8  --  7.9 7.6 6.9  HGB 7.8* 9.0* 8.2* 8.4* 8.4*  HCT 26.6* 30.3* 27.6* 27.6* 28.2*  PLT 132*  --  124* 124* 123*    COAGS: Recent Labs    11/15/21 2054  INR 1.5*  APTT 40*    BMP: Recent Labs    11/13/21 0232 11/14/21 0408 11/15/21 0323 11/16/21 0422  NA 138 136 136 136  K 4.9 4.2 4.5 4.2  CL 111 110  112* 110  CO2 18* 18* 18* 18*  GLUCOSE 95 88 86 147*  BUN 36* 35* 31* 27*  CALCIUM 8.6* 8.4* 8.5* 8.7*  CREATININE 2.29* 2.16* 2.02* 2.21*  GFRNONAA 29* 31* 34* 30*    LIVER FUNCTION TESTS: Recent Labs    11/12/21 1259 11/15/21 2054  BILITOT 0.4 0.7  AST 22 20  ALT 27 21  ALKPHOS 50 53  PROT 6.2* 6.1*  ALBUMIN 3.3* 3.1*    TUMOR MARKERS: No results for input(s): "AFPTM", "CEA", "CA199", "CHROMGRNA" in the last 8760 hours.  Assessment and Plan:  Right hydronephrosis Acute kidney injury - known solitary kidney - Cr 2.2 OR unsuccessful to place stent 11/15/21 Request IR place percutaneous nephrostomy tube Risks and benefits of Right PCN placement was discussed with the patient including, but not limited to, infection, bleeding, significant bleeding causing loss or decrease in renal function or damage to adjacent structures.   All of the patient's questions were answered, patient is agreeable to proceed. Consent signed and in chart.   Thank you for this interesting consult.  I greatly enjoyed meeting Jack Fuentes and look forward to participating in their care.  A copy of this report was sent to the requesting provider on this date.  Electronically Signed: Lavonia Drafts, PA-C 11/16/2021, 8:57 AM   I spent a total of 20 Minutes    in face to face in clinical consultation, greater than 50% of which was counseling/coordinating care for R percutaneous nephrostomy placement

## 2021-11-16 NOTE — Progress Notes (Signed)
Rounding Note    Patient Name: Jack Fuentes Date of Encounter: 11/16/2021  [place] Cardiologist: Freada Bergeron, MD   Subjective   Patient is resting comfortably in bed this morning. He reports subjective improvement in fatigue and weakness this morning. Telemetry review shows sinus rhythm with 1:1 conduction and no recurrence of type 2 block after returning from urologic procedure. Denies chest pain, shortness of breath, lightheadedness/dizziness.  Inpatient Medications    Scheduled Meds:  furosemide  40 mg Oral Daily   gabapentin  300 mg Oral BID   multivitamin with minerals  1 tablet Oral Daily   tamsulosin  0.4 mg Oral Daily   Continuous Infusions:  cefTRIAXone (ROCEPHIN)  IV     ferric gluconate (FERRLECIT) IVPB 250 mg (11/16/21 0825)   PRN Meds: acetaminophen **OR** acetaminophen, ondansetron **OR** ondansetron (ZOFRAN) IV, mouth rinse, senna-docusate   Vital Signs    Vitals:   11/15/21 1930 11/15/21 2014 11/15/21 2015 11/16/21 0353  BP: 121/72 125/72  107/65  Pulse: 78 78  75  Resp: (!) 23 (!) 24  20  Temp: (!) 97.5 F (36.4 C) (!) 97.5 F (36.4 C)  (!) 97.4 F (36.3 C)  TempSrc:  Oral  Oral  SpO2: 92% 91% 95% 93%  Weight:      Height:        Intake/Output Summary (Last 24 hours) at 11/16/2021 0945 Last data filed at 11/16/2021 0626 Gross per 24 hour  Intake 400 ml  Output 1202 ml  Net -802 ml      11/12/2021    9:04 PM 08/08/2018    2:23 PM 08/06/2018   11:33 AM  Last 3 Weights  Weight (lbs) 176 lb 145 lb 14.4 oz 145 lb  Weight (kg) 79.833 kg 66.18 kg 65.772 kg      Telemetry    Sinus rhythm - Personally Reviewed  ECG    No ECG today  Physical Exam   GEN: No acute distress.   Neck: No JVD Cardiac: RRR, no murmurs, rubs, or gallops.  Respiratory: Clear to auscultation bilaterally. GI: Soft, nontender, non-distended  MS: No edema; No deformity. Neuro:  Nonfocal  Psych: Normal affect   Labs    High Sensitivity  Troponin:   Recent Labs  Lab 11/12/21 1259 11/12/21 1623  TROPONINIHS 23* 21*     Chemistry Recent Labs  Lab 11/12/21 1259 11/13/21 0232 11/14/21 0408 11/15/21 0323 11/15/21 2054 11/16/21 0422  NA 141   < > 136 136  --  136  K 5.0   < > 4.2 4.5  --  4.2  CL 114*   < > 110 112*  --  110  CO2 19*   < > 18* 18*  --  18*  GLUCOSE 103*   < > 88 86  --  147*  BUN 34*   < > 35* 31*  --  27*  CREATININE 2.44*   < > 2.16* 2.02*  --  2.21*  CALCIUM 8.8*   < > 8.4* 8.5*  --  8.7*  MG  --   --   --  1.9  --   --   PROT 6.2*  --   --   --  6.1*  --   ALBUMIN 3.3*  --   --   --  3.1*  --   AST 22  --   --   --  20  --   ALT 27  --   --   --  21  --   ALKPHOS 50  --   --   --  53  --   BILITOT 0.4  --   --   --  0.7  --   GFRNONAA 27*   < > 31* 34*  --  30*  ANIONGAP 8   < > 8 6  --  8   < > = values in this interval not displayed.    Lipids No results for input(s): "CHOL", "TRIG", "HDL", "LABVLDL", "LDLCALC", "CHOLHDL" in the last 168 hours.  Hematology Recent Labs  Lab 11/14/21 0408 11/15/21 0323 11/16/21 0422  WBC 7.9 7.6 6.9  RBC 3.36* 3.38* 3.38*  HGB 8.2* 8.4* 8.4*  HCT 27.6* 27.6* 28.2*  MCV 82.1 81.7 83.4  MCH 24.4* 24.9* 24.9*  MCHC 29.7* 30.4 29.8*  RDW 18.5* 19.0* 19.6*  PLT 124* 124* 123*   Thyroid  Recent Labs  Lab 11/15/21 0323  TSH 5.103*    BNPNo results for input(s): "BNP", "PROBNP" in the last 168 hours.  DDimer No results for input(s): "DDIMER" in the last 168 hours.   Radiology    DG C-Arm 1-60 Min  Result Date: 11/15/2021 CLINICAL DATA:  Cystoscopy with retrograde pyelogram. EXAM: DG C-ARM 1-60 MIN FLUOROSCOPY: Radiation Exposure Index (if provided by the fluoroscopic device): 37.8 mGy COMPARISON:  None Available. FINDINGS: Three images demonstrate contrast in the right distal ureter. Two of the images show a wire in the right distal ureter. IMPRESSION: 1. Contrast injection of the right ureter and passage of a wire in the right distal ureter.  Electronically Signed   By: Van Clines M.D.   On: 11/15/2021 19:31   DG CHEST PORT 1 VIEW  Result Date: 11/15/2021 CLINICAL DATA:  Hypoxemia.  Non-Hodgkin's lymphoma. EXAM: PORTABLE CHEST 1 VIEW COMPARISON:  Overlapping portion abdomen CT from 11/13/2021 FINDINGS: Layering bilateral pleural effusions and associated passive atelectasis. Atherosclerotic calcification of the aortic arch. Heart size within normal limits. Difficult to completely exclude the possibility of superimposed airspace opacity beyond the pleural effusions and passive atelectasis, especially in the left lower lobe-cannot exclude left lower lobe pneumonia. IMPRESSION: 1. Layering pleural effusions with passive atelectasis. 2. Left lower lobe airspace opacity, cannot exclude pneumonia. 3.  Aortic Atherosclerosis (ICD10-I70.0). Electronically Signed   By: Van Clines M.D.   On: 11/15/2021 19:29    Cardiac Studies   11/13/21 TTE   IMPRESSIONS    1. Left ventricular ejection fraction, by estimation, is 55 to 60%. The  left ventricle has normal function. The left ventricle has no regional  wall motion abnormalities. Left ventricular diastolic parameters are  consistent with Grade II diastolic  dysfunction (pseudonormalization). Elevated left atrial pressure.   2. Right ventricular systolic function is normal. The right ventricular  size is normal. There is moderately elevated pulmonary artery systolic  pressure.   3. Left atrial size was mild to moderately dilated.   4. The mitral valve is abnormal. Moderate mitral valve regurgitation. No  evidence of mitral stenosis.   5. The tricuspid valve is abnormal. Tricuspid valve regurgitation is mild  to moderate.   6. The aortic valve was not well visualized. There is mild calcification  of the aortic valve. There is mild thickening of the aortic valve. Aortic  valve regurgitation is mild to moderate. No aortic stenosis is present.   7. The inferior vena cava is  dilated in size with >50% respiratory  variability, suggesting right atrial pressure of 8 mmHg.   FINDINGS  Left Ventricle: Left ventricular ejection fraction, by estimation, is 55  to 60%. The left ventricle has normal function. The left ventricle has no  regional wall motion abnormalities. The left ventricular internal cavity  size was normal in size. There is   no left ventricular hypertrophy. Left ventricular diastolic parameters  are consistent with Grade II diastolic dysfunction (pseudonormalization).  Elevated left atrial pressure.   Patient Profile     76 y.o. male with a hx of non-Hodgkins lymphoma s/p chemo and XRT, HLD, peripheral neuropathy, normocytic anemia, and chronic RBBB and LAFB who presented with profound anemia with suspected AKI on CKD with found to have intermittent Mobitz type II AVB on telemetry for which Cardiology was consulted.   Assessment & Plan    Second degree AV block type II   Patient not on any nodal talking agents was found with second-degree AV block type II on both telemetry and ECG on arrival.  Patient has a known history of right bundle branch block and left anterior fascicular block.  Review of overnight telemetry 9/10-9/11 showed intermittent episodes of same type II AV node block.  Patient denied history of syncope or presyncope both today and recent weeks. Patient underwent a cystoscopy yesterday and upon returning to the floor, was noted to be in sinus rhythm with 1:1 conduction which he maintained overnight.   Continue anemia and AKI management per primary team Continue to avoid all nodal blocking agents Continue close monitoring of electrolytes Plan for [facility] (arranged by EP with EP follow up scheduled)   Chronic HFpEF   Patient previously noted with a reduced ejection fraction of 45-50% on his 2020 transthoracic echocardiogram, now recovered to 55 to 60% on repeat echo this admission. Etiology of patient's cardiomyopathy is unknown.    Continue as needed diuresis to maintain euvolemia while receiving blood transfusions and other intravascular treatment. Please continue avoiding ALL AV nodal blocking agents. He has been unable to tolerate ACE/ARB/ARNI/MRA due to his current AKI (improving).  Recommend adding goal-directed medical therapy as able.  Will arrange outpatient general cardiology follow up   Normocytic anemia   Patient admitted due to shortness of breath and fatigue found with a hemoglobin of 6.2 on admission.  Patient reports that he saw his PCP in mid August of this year and was found with a hemoglobin of 5.6 with low ferritin.  Patient has been on Plavix due to his coronary artery disease, denies GI bleed.  Patient received 2 units of packed red blood cells on 9/9 and now has a hemoglobin of 8.4   Continue to hold Plavix and trend CBC Continue management with IV ferritin per primary team     AKI on suspected CKD Moderate/Severe right sided hydronephrosis   Creatinine 2.21 today, improved from 2.44 on admission. CT abd/pelvis with finding of severe right side hydronephrosis with right renal calculi. Patient underwent cystoscopy on 9/11 but stent unable to be placed.     Management per primary team with outpatient urology follow up. Right nephrostomy planned for today      CAD (presumed)   Patient reports previous stress testing with [facility] clinic that was notable for an inferior scar. He has been on Plavix since. Patient without symptoms of chest pain. TTE shows improved LVEF and no wall motion abnormalities.    Recommend outpatient follow-up for medical management of CAD. No strong indication for plavix. ASA could be considered in future upon stabilization of HGB.      For questions or updates,  please contact [place] Please consult [email or web address] for contact info under        Signed, Zaine Elsass, PA-C  11/16/2021, 9:45 AM

## 2021-11-16 NOTE — Progress Notes (Signed)
1 Day Post-Op Subjective: No acute complaints today. NPO in preparation for IR procedure. He has continued to make urine, Cr trending up slightly this AM  Objective: Vital signs in last 24 hours: Temp:  [97.4 F (36.3 C)-98 F (36.7 C)] 97.4 F (36.3 C) (09/12 0353) Pulse Rate:  [50-85] 75 (09/12 0353) Resp:  [18-29] 20 (09/12 0353) BP: (102-125)/(58-74) 107/65 (09/12 0353) SpO2:  [85 %-97 %] 93 % (09/12 0353)  Intake/Output from previous day: 09/11 0701 - 09/12 0700 In: 400 [I.V.:400] Out: 1202 [Urine:1200; Blood:2] Intake/Output this shift: No intake/output data recorded.  Physical Exam:  General: Alert and oriented CV: RRR Lungs: Clear Abdomen: Soft, ND, ATTP Ext: NT, No erythema  Lab Results: Recent Labs    11/14/21 0408 11/15/21 0323 11/16/21 0422  HGB 8.2* 8.4* 8.4*  HCT 27.6* 27.6* 28.2*   BMET Recent Labs    11/15/21 0323 11/16/21 0422  NA 136 136  K 4.5 4.2  CL 112* 110  CO2 18* 18*  GLUCOSE 86 147*  BUN 31* 27*  CREATININE 2.02* 2.21*  CALCIUM 8.5* 8.7*     Studies/Results: DG C-Arm 1-60 Min  Result Date: 11/15/2021 CLINICAL DATA:  Cystoscopy with retrograde pyelogram. EXAM: DG C-ARM 1-60 MIN FLUOROSCOPY: Radiation Exposure Index (if provided by the fluoroscopic device): 37.8 mGy COMPARISON:  None Available. FINDINGS: Three images demonstrate contrast in the right distal ureter. Two of the images show a wire in the right distal ureter. IMPRESSION: 1. Contrast injection of the right ureter and passage of a wire in the right distal ureter. Electronically Signed   By: Van Clines M.D.   On: 11/15/2021 19:31   DG CHEST PORT 1 VIEW  Result Date: 11/15/2021 CLINICAL DATA:  Hypoxemia.  Non-Hodgkin's lymphoma. EXAM: PORTABLE CHEST 1 VIEW COMPARISON:  Overlapping portion abdomen CT from 11/13/2021 FINDINGS: Layering bilateral pleural effusions and associated passive atelectasis. Atherosclerotic calcification of the aortic arch. Heart size within  normal limits. Difficult to completely exclude the possibility of superimposed airspace opacity beyond the pleural effusions and passive atelectasis, especially in the left lower lobe-cannot exclude left lower lobe pneumonia. IMPRESSION: 1. Layering pleural effusions with passive atelectasis. 2. Left lower lobe airspace opacity, cannot exclude pneumonia. 3.  Aortic Atherosclerosis (ICD10-I70.0). Electronically Signed   By: Van Clines M.D.   On: 11/15/2021 19:29    Assessment/Plan: Jack Fuentes is a 76 yo M with R obstructing ureteral stones, hydronephrosis in a solitary kidney, s/p failed retrograde stent placement yesterday. He is NPO in preparation for IR procedure today  -We discussed IR would place a nephrostomy tube today, possibly antegrade stent if able. If they are not able to place a stent, I would likely set him up for percutaneous nephrolithotomy after discharge with one of my partners to address the stones in the right kidney. Mr Hunnicutt voiced understanding and I answered all of his questions.    LOS: 3 days   Donald Pore MD 11/16/2021, 10:27 AM [facility]  Pager: [phone]

## 2021-11-16 NOTE — Progress Notes (Signed)
Mobility Specialist - Progress Note   11/16/21 1519  Mobility  Activity Ambulated with assistance in hallway  Level of Assistance Independent after set-up  Assistive Device None  Distance Ambulated (ft) 350 ft  Activity Response Tolerated well  $Mobility charge 1 Mobility    Pre-mobility: 96% SpO2 During mobility: 101 HR, 93% SpO2 Post-mobility: 86 HR, 99%SpO2  Pt was received in bed and agreeable to mobility. No c/o pain throughout ambulation. Pt was returned back to bed with all needs met.   Larey Seat

## 2021-11-16 NOTE — Telephone Encounter (Signed)
Scheduled appt with Dr. Irene Limbo per 9/11 staff msg from Dr. Julien Nordmann. Pt was a previous pt of Dr. Grier Mitts. Spoke to pt who is aware of appt date/time.

## 2021-11-17 ENCOUNTER — Encounter: Payer: Self-pay | Admitting: Gastroenterology

## 2021-11-17 DIAGNOSIS — N179 Acute kidney failure, unspecified: Secondary | ICD-10-CM | POA: Diagnosis not present

## 2021-11-17 DIAGNOSIS — I441 Atrioventricular block, second degree: Secondary | ICD-10-CM | POA: Diagnosis not present

## 2021-11-17 DIAGNOSIS — J90 Pleural effusion, not elsewhere classified: Secondary | ICD-10-CM | POA: Diagnosis not present

## 2021-11-17 DIAGNOSIS — D649 Anemia, unspecified: Secondary | ICD-10-CM | POA: Diagnosis not present

## 2021-11-17 DIAGNOSIS — I5022 Chronic systolic (congestive) heart failure: Secondary | ICD-10-CM | POA: Diagnosis not present

## 2021-11-17 DIAGNOSIS — D509 Iron deficiency anemia, unspecified: Secondary | ICD-10-CM | POA: Diagnosis not present

## 2021-11-17 DIAGNOSIS — N184 Chronic kidney disease, stage 4 (severe): Secondary | ICD-10-CM | POA: Diagnosis not present

## 2021-11-17 LAB — CBC WITH DIFFERENTIAL/PLATELET
Abs Immature Granulocytes: 0.02 10*3/uL (ref 0.00–0.07)
Basophils Absolute: 0 10*3/uL (ref 0.0–0.1)
Basophils Relative: 0 %
Eosinophils Absolute: 0.2 10*3/uL (ref 0.0–0.5)
Eosinophils Relative: 3 %
HCT: 28.7 % — ABNORMAL LOW (ref 39.0–52.0)
Hemoglobin: 8.3 g/dL — ABNORMAL LOW (ref 13.0–17.0)
Immature Granulocytes: 0 %
Lymphocytes Relative: 17 %
Lymphs Abs: 1.3 10*3/uL (ref 0.7–4.0)
MCH: 25 pg — ABNORMAL LOW (ref 26.0–34.0)
MCHC: 28.9 g/dL — ABNORMAL LOW (ref 30.0–36.0)
MCV: 86.4 fL (ref 80.0–100.0)
Monocytes Absolute: 0.9 10*3/uL (ref 0.1–1.0)
Monocytes Relative: 11 %
Neutro Abs: 5.5 10*3/uL (ref 1.7–7.7)
Neutrophils Relative %: 69 %
Platelets: 128 10*3/uL — ABNORMAL LOW (ref 150–400)
RBC: 3.32 MIL/uL — ABNORMAL LOW (ref 4.22–5.81)
RDW: 20.5 % — ABNORMAL HIGH (ref 11.5–15.5)
WBC: 7.9 10*3/uL (ref 4.0–10.5)
nRBC: 0 % (ref 0.0–0.2)

## 2021-11-17 LAB — BASIC METABOLIC PANEL
Anion gap: 8 (ref 5–15)
BUN: 24 mg/dL — ABNORMAL HIGH (ref 8–23)
CO2: 19 mmol/L — ABNORMAL LOW (ref 22–32)
Calcium: 8.5 mg/dL — ABNORMAL LOW (ref 8.9–10.3)
Chloride: 110 mmol/L (ref 98–111)
Creatinine, Ser: 2.01 mg/dL — ABNORMAL HIGH (ref 0.61–1.24)
GFR, Estimated: 34 mL/min — ABNORMAL LOW (ref >60)
Glucose, Bld: 83 mg/dL (ref 70–99)
Potassium: 4 mmol/L (ref 3.5–5.1)
Sodium: 137 mmol/L (ref 135–145)

## 2021-11-17 LAB — T4, FREE: Free T4: 1.35 ng/dL — ABNORMAL HIGH (ref 0.61–1.12)

## 2021-11-17 MED ORDER — FERROUS SULFATE 325 (65 FE) MG PO TABS: 325.0000 mg | ORAL_TABLET | ORAL | Status: DC

## 2021-11-17 MED ORDER — FUROSEMIDE 40 MG PO TABS
40.0000 mg | ORAL_TABLET | Freq: Once | ORAL | Status: AC
  Administered 2021-11-17: 40 mg via ORAL
  Filled 2021-11-17: qty 1

## 2021-11-17 NOTE — Care Management Important Message (Signed)
Important Message  Patient Details  Name: KAINOA SWOBODA MRN: [id number] Date of Birth: 1945/07/29   Medicare Important Message Given:  Yes     Shelda Altes 11/17/2021, 9:36 AM

## 2021-11-17 NOTE — Progress Notes (Signed)
Rounding Note    Patient Name: Jack Fuentes Date of Encounter: 11/17/2021  [place] Cardiologist: Freada Bergeron, MD   Subjective   Patient is sitting on the side of the bed this morning on my exam. He appears clinically much improved compared with yesterday. Patient reports that he has been able to ambulate without shortness of breath or chest pain.   Inpatient Medications    Scheduled Meds:  ferrous sulfate  325 mg Oral Daily   gabapentin  300 mg Oral BID   multivitamin with minerals  1 tablet Oral Daily   tamsulosin  0.4 mg Oral Daily   Continuous Infusions:  PRN Meds: acetaminophen **OR** acetaminophen, ondansetron **OR** ondansetron (ZOFRAN) IV, mouth rinse, senna-docusate   Vital Signs    Vitals:   11/16/21 1240 11/16/21 2020 11/17/21 0000 11/17/21 0445  BP: 121/72 112/73  115/72  Pulse: 88 73  73  Resp: 20 (!) 23  16  Temp:  97.9 F (36.6 C)  97.9 F (36.6 C)  TempSrc:  Oral  Oral  SpO2: 92% 94% 96% 95%  Weight:      Height:        Intake/Output Summary (Last 24 hours) at 11/17/2021 1025 Last data filed at 11/17/2021 0654 Gross per 24 hour  Intake 120 ml  Output 1900 ml  Net -1780 ml      11/12/2021    9:04 PM 08/08/2018    2:23 PM 08/06/2018   11:33 AM  Last 3 Weights  Weight (lbs) 176 lb 145 lb 14.4 oz 145 lb  Weight (kg) 79.833 kg 66.18 kg 65.772 kg      Telemetry    Sinus rhythm with nocturnal AV block type II with 2:1 conduction - Personally Reviewed  ECG    No ECG today  Physical Exam   GEN: No acute distress.   Neck: No JVD Cardiac: RRR, no murmurs, rubs, or gallops.  Respiratory: Clear to auscultation bilaterally. No rales appreciated. GI: Soft, nontender, non-distended  MS: No edema; No deformity. Neuro:  Nonfocal  Psych: Normal affect   Labs    High Sensitivity Troponin:   Recent Labs  Lab 11/12/21 1259 11/12/21 1623  TROPONINIHS 23* 21*     Chemistry Recent Labs  Lab 11/12/21 1259  11/13/21 0232 11/15/21 0323 11/15/21 2054 11/16/21 0422 11/17/21 0441  NA 141   < > 136  --  136 137  K 5.0   < > 4.5  --  4.2 4.0  CL 114*   < > 112*  --  110 110  CO2 19*   < > 18*  --  18* 19*  GLUCOSE 103*   < > 86  --  147* 83  BUN 34*   < > 31*  --  27* 24*  CREATININE 2.44*   < > 2.02*  --  2.21* 2.01*  CALCIUM 8.8*   < > 8.5*  --  8.7* 8.5*  MG  --   --  1.9  --   --   --   PROT 6.2*  --   --  6.1*  --   --   ALBUMIN 3.3*  --   --  3.1*  --   --   AST 22  --   --  20  --   --   ALT 27  --   --  21  --   --   ALKPHOS 50  --   --  53  --   --  BILITOT 0.4  --   --  0.7  --   --   GFRNONAA 27*   < > 34*  --  30* 34*  ANIONGAP 8   < > 6  --  8 8   < > = values in this interval not displayed.    Lipids No results for input(s): "CHOL", "TRIG", "HDL", "LABVLDL", "LDLCALC", "CHOLHDL" in the last 168 hours.  Hematology Recent Labs  Lab 11/15/21 0323 11/16/21 0422 11/17/21 0441  WBC 7.6 6.9 7.9  RBC 3.38* 3.38* 3.32*  HGB 8.4* 8.4* 8.3*  HCT 27.6* 28.2* 28.7*  MCV 81.7 83.4 86.4  MCH 24.9* 24.9* 25.0*  MCHC 30.4 29.8* 28.9*  RDW 19.0* 19.6* 20.5*  PLT 124* 123* 128*   Thyroid  Recent Labs  Lab 11/15/21 0323 11/17/21 0441  TSH 5.103*  --   FREET4  --  1.35*    BNPNo results for input(s): "BNP", "PROBNP" in the last 168 hours.  DDimer No results for input(s): "DDIMER" in the last 168 hours.   Radiology    CT CHEST WO CONTRAST  Result Date: 11/16/2021 CLINICAL DATA:  Dyspnea, chronic, in clear etiology. EXAM: CT CHEST WITHOUT CONTRAST TECHNIQUE: Multidetector CT imaging of the chest was performed following the standard protocol without IV contrast. RADIATION DOSE REDUCTION: This exam was performed according to the departmental dose-optimization program which includes automated exposure control, adjustment of the mA and/or kV according to patient size and/or use of iterative reconstruction technique. COMPARISON:  Chest radiography yesterday.  CT abdomen 11/13/2021  FINDINGS: Cardiovascular: Mild cardiac enlargement. Coronary artery calcification is noted. Aortic atherosclerotic calcification is noted. Mediastinum/Nodes: No mediastinal or hilar mass or lymphadenopathy. Lungs/Pleura: Moderate size pleural effusions layering dependently with dependent atelectasis, particularly affecting the lower lobes. Non dependent lungs are well aerated. Upper Abdomen: No acute finding. Musculoskeletal: Ordinary mild chronic degenerative changes. IMPRESSION: Cardiomegaly. Coronary artery calcification and aortic atherosclerotic calcification. Moderate size bilateral pleural effusions layering dependently with dependent pulmonary atelectasis, particularly affecting the lower lobes. Electronically Signed   By: Nelson Chimes M.D.   On: 11/16/2021 19:56   IR NEPHROSTOMY PLACEMENT RIGHT  Result Date: 11/16/2021 INDICATION: 76 year old male with solitary functioning right kidney and hydronephrosis in the setting of obstructing ureteral stones. Ureteral stent placement was unsuccessful. He presents for percutaneous nephrostomy tube placement for renal decompression. EXAM: IR NEPHROSTOMY PLACEMENT RIGHT COMPARISON:  None Available. MEDICATIONS: 2 g Rocephin; The antibiotic was administered in an appropriate time frame prior to skin puncture. ANESTHESIA/SEDATION: Fentanyl 50 mcg IV; 5 mg Valium p.o. Moderate Sedation Time:  13 minutes The patient was continuously monitored during the procedure by the interventional radiology nurse under my direct supervision. CONTRAST:  44m OMNIPAQUE IOHEXOL 300 MG/ML SOLN - administered into the collecting system(s) FLUOROSCOPY: Radiation exposure index: 3 mGy reference air kerma COMPLICATIONS: None immediate. TECHNIQUE: The procedure, risks, benefits, and alternatives were explained to the patient. Questions regarding the procedure were encouraged and answered. The patient understands and consents to the procedure. The right flank was prepped with chlorhexidine  in a sterile fashion, and a sterile drape was applied covering the operative field. A sterile gown and sterile gloves were used for the procedure. Local anesthesia was provided with 1% Lidocaine. The right flank was interrogated with ultrasound and the left kidney identified. The kidney is hydronephrotic. A suitable access site on the skin overlying the lower pole, posterior calix was identified. After local mg anesthesia was achieved, a small skin nick was made with an 11 blade  scalpel. A 21 gauge Accustick needle was then advanced under direct sonographic guidance into the lower pole of the right kidney. A 0.018 inch wire was advanced under fluoroscopic guidance into the left renal collecting system. The Accustick sheath was then advanced over the wire and a 0.018 system exchanged for a 0.035 system. Gentle hand injection of contrast material confirms placement of the sheath within the renal collecting system. There is significant hydronephrosis. The tract from the scan into the renal collecting system was then dilated serially to 10-French. A 10-French Cook all-purpose drain was then placed and positioned under fluoroscopic guidance. The locking loop is well formed within the left renal pelvis. The catheter was secured to the skin with 2-0 Prolene and a sterile bandage was placed. Catheter was left to gravity bag drainage. IMPRESSION: Successful placement of a right 10 French percutaneous nephrostomy tube. Electronically Signed   By: Jacqulynn Cadet M.D.   On: 11/16/2021 13:14   DG C-Arm 1-60 Min  Result Date: 11/15/2021 CLINICAL DATA:  Cystoscopy with retrograde pyelogram. EXAM: DG C-ARM 1-60 MIN FLUOROSCOPY: Radiation Exposure Index (if provided by the fluoroscopic device): 37.8 mGy COMPARISON:  None Available. FINDINGS: Three images demonstrate contrast in the right distal ureter. Two of the images show a wire in the right distal ureter. IMPRESSION: 1. Contrast injection of the right ureter and passage  of a wire in the right distal ureter. Electronically Signed   By: Van Clines M.D.   On: 11/15/2021 19:31   DG CHEST PORT 1 VIEW  Result Date: 11/15/2021 CLINICAL DATA:  Hypoxemia.  Non-Hodgkin's lymphoma. EXAM: PORTABLE CHEST 1 VIEW COMPARISON:  Overlapping portion abdomen CT from 11/13/2021 FINDINGS: Layering bilateral pleural effusions and associated passive atelectasis. Atherosclerotic calcification of the aortic arch. Heart size within normal limits. Difficult to completely exclude the possibility of superimposed airspace opacity beyond the pleural effusions and passive atelectasis, especially in the left lower lobe-cannot exclude left lower lobe pneumonia. IMPRESSION: 1. Layering pleural effusions with passive atelectasis. 2. Left lower lobe airspace opacity, cannot exclude pneumonia. 3.  Aortic Atherosclerosis (ICD10-I70.0). Electronically Signed   By: Van Clines M.D.   On: 11/15/2021 19:29    Cardiac Studies   11/13/21 TTE   IMPRESSIONS    1. Left ventricular ejection fraction, by estimation, is 55 to 60%. The  left ventricle has normal function. The left ventricle has no regional  wall motion abnormalities. Left ventricular diastolic parameters are  consistent with Grade II diastolic  dysfunction (pseudonormalization). Elevated left atrial pressure.   2. Right ventricular systolic function is normal. The right ventricular  size is normal. There is moderately elevated pulmonary artery systolic  pressure.   3. Left atrial size was mild to moderately dilated.   4. The mitral valve is abnormal. Moderate mitral valve regurgitation. No  evidence of mitral stenosis.   5. The tricuspid valve is abnormal. Tricuspid valve regurgitation is mild  to moderate.   6. The aortic valve was not well visualized. There is mild calcification  of the aortic valve. There is mild thickening of the aortic valve. Aortic  valve regurgitation is mild to moderate. No aortic stenosis is present.    7. The inferior vena cava is dilated in size with >50% respiratory  variability, suggesting right atrial pressure of 8 mmHg.   FINDINGS   Left Ventricle: Left ventricular ejection fraction, by estimation, is 55  to 60%. The left ventricle has normal function. The left ventricle has no  regional wall motion abnormalities. The  left ventricular internal cavity  size was normal in size. There is   no left ventricular hypertrophy. Left ventricular diastolic parameters  are consistent with Grade II diastolic dysfunction (pseudonormalization).  Elevated left atrial pressure.   Patient Profile     76 y.o. male with a hx of non-Hodgkins lymphoma s/p chemo and XRT, HLD, peripheral neuropathy, normocytic anemia, and chronic RBBB and LAFB who presented with profound anemia with suspected AKI on CKD with found to have intermittent Mobitz type II AVB on telemetry for which Cardiology was consulted.   Assessment & Plan    Second degree AV block type II   Patient not on any nodal talking agents was found with second-degree AV block type II on both telemetry and ECG on arrival.  Patient has a known history of right bundle branch block and left anterior fascicular block.  Review of overnight telemetry 9/10-9/11 showed intermittent episodes of same type II AV node block.  Patient denied history of syncope or presyncope both today and recent weeks. Patient underwent a cystoscopy and upon returning to the floor, was noted to be in sinus rhythm with 1:1 conduction which he maintained overnight. Overnight 9/12-9/13 patient again with recurrence of Mobitz II with 2:1 conduction. Yesterday patient tolerated ambulation with appropriate HR response. EP continues to follow and does not believe a PPM is warranted at this time.   Continue anemia and AKI management per primary team Continue to avoid all nodal blocking agents Continue close monitoring of electrolytes Plan for Zio (arranged by EP with EP follow up  scheduled)   Chronic HFpEF   Patient previously noted with a reduced ejection fraction of 45-50% on his 2020 transthoracic echocardiogram, now recovered to 55 to 60% on repeat echo this admission. Etiology of patient's cardiomyopathy is unknown.   Continue as needed diuresis to maintain euvolemia while receiving blood transfusions and other intravascular treatment. Please continue avoiding ALL AV nodal blocking agents. He has been unable to tolerate ACE/ARB/ARNI/MRA due to his current AKI (improving).  Recommend adding goal-directed medical therapy as able.  Will arrange outpatient general cardiology follow up   Normocytic anemia   Patient admitted due to shortness of breath and fatigue found with a hemoglobin of 6.2 on admission.  Patient reports that he saw his PCP in mid August of this year and was found with a hemoglobin of 5.6 with low ferritin.  Patient has been on Plavix due to his coronary artery disease, denies GI bleed.  Patient received 2 units of packed red blood cells on 9/9 and now has a hemoglobin of 8.4   Continue to hold Plavix and trend CBC Continue management with IV ferritin per primary team     AKI on suspected CKD Moderate/Severe right sided hydronephrosis   Creatinine 2.21 today, improved from 2.44 on admission. CT abd/pelvis with finding of severe right side hydronephrosis with right renal calculi. Patient underwent cystoscopy on 9/11 but stent unable to be placed. Patient now s/p nephrostomy tube placement 9/12.    Management per primary team with outpatient urology follow up.      CAD (presumed)   Patient reports previous stress testing with [facility] that was notable for an inferior scar. He has been on Plavix since. Patient without symptoms of chest pain. TTE shows improved LVEF and no wall motion abnormalities.    Recommend outpatient follow-up for medical management of CAD. No strong indication for plavix. ASA could be considered in future upon  stabilization of HGB.  For questions or updates, please contact [place] Please consult [email or web address] for contact info under        Signed, Jaivon Vanbeek, PA-C  11/17/2021, 10:25 AM

## 2021-11-17 NOTE — Discharge Instructions (Addendum)
Mr. Jack Fuentes, It was a pleasure taking care of you at [facility]. Now that you are doing better, we are going to send you home. Here are your instructions after getting discharged form the hospital.   1) Regarding our shortness of breath, we are thinking that was multifactorial with Anemia and with the fluid on your lungs. We have given you water pills during your hospital stay to help you get some of that fluid off and this has helped your shortness of breath. We will send you home on Lasix 40 mg daily.  2) Regarding your anemia, you needed 3 units of blood in the hospital. WE have also given you 4 iron infusions in the hospital. We will send you home on 325 mg Ferrous sulfate to take once a day. Please avoid tumeric as this can affect the absorption of iron. I have also set you up with an appointment with the stomach doctors here. Please go to your appointment with Alonza Bogus with [facility] on 12/20/2021 at 10:30am.   3) Regarding your second-degree AV block, please follow-up with Dr. Myles Gip on 12/21/2021 at 11:30 AM.  Please show up.    4) Regarding your questionable history of coronary artery disease, please follow-up with the cardiologists. You have an appoitment with Murray Hodgkins, NP on 12/15/2021.  Please show up.  5) Regarding your history of non-Hodgkin's lymphoma, we have set you up with the hematologist/oncologist here at [facility].  You have an appointment with Dr. Irene Limbo on 12/08/2021 at 2:30 PM.  Please show up.  6) Regarding the Nephrostomy tube placement and kidney stone management, please reach out to [facility] with Dr. Cain Sieve to schedule outpatient follow up.  7) Regarding the mildly abnormal Thyroid tests, please follow up with your PCP to be evaluated.   Take Care, Leigh Aurora, DO

## 2021-11-17 NOTE — Progress Notes (Addendum)
Subjective:   Hospital day: 5  Overnight event: Overnight, CT chest resulted showing bilateral pleural effusions with atelectasis to lower lobes bilaterally.  Of note, patient also needed oxygen overnight, with 2 L, but the patient was not short of breath at the time, patient was just sleeping.  Patient is resting in bed comfortably upon my exam.  He denies any shortness of breath.  He denies any shortness of breath on exertion, or at rest. Patient denies any chest pain.  He does report some bilateral hip pain and soreness.  He denies any abdominal pain.  He does report that he is eating and drinking well.  Objective:  Vital signs in last 24 hours: Vitals:   11/16/21 1240 11/16/21 2020 11/17/21 0000 11/17/21 0445  BP: 121/72 112/73  115/72  Pulse: 88 73  73  Resp: 20 (!) 23  16  Temp:  97.9 F (36.6 C)  97.9 F (36.6 C)  TempSrc:  Oral  Oral  SpO2: 92% 94% 96% 95%  Weight:      Height:        Filed Weights   11/12/21 2104  Weight: 79.8 kg     Intake/Output Summary (Last 24 hours) at 11/17/2021 1136 Last data filed at 11/17/2021 0654 Gross per 24 hour  Intake 120 ml  Output 1900 ml  Net -1780 ml    Net IO Since Admission: -2,155.5 mL [11/17/21 1136]  No results for input(s): "GLUCAP" in the last 72 hours.   Pertinent Labs:    Latest Ref Rng & Units 11/17/2021    4:41 AM 11/16/2021    4:22 AM 11/15/2021    3:23 AM  CBC  WBC 4.0 - 10.5 K/uL 7.9  6.9  7.6   Hemoglobin 13.0 - 17.0 g/dL 8.3  8.4  8.4   Hematocrit 39.0 - 52.0 % 28.7  28.2  27.6   Platelets 150 - 400 K/uL 128  123  124        Latest Ref Rng & Units 11/17/2021    4:41 AM 11/16/2021    4:22 AM 11/15/2021    8:54 PM  CMP  Glucose 70 - 99 mg/dL 83  147    BUN 8 - 23 mg/dL 24  27    Creatinine 0.61 - 1.24 mg/dL 2.01  2.21    Sodium 135 - 145 mmol/L 137  136    Potassium 3.5 - 5.1 mmol/L 4.0  4.2    Chloride 98 - 111 mmol/L 110  110    CO2 22 - 32 mmol/L 19  18    Calcium 8.9 - 10.3 mg/dL 8.5  8.7     Total Protein 6.5 - 8.1 g/dL   6.1   Total Bilirubin 0.3 - 1.2 mg/dL   0.7   Alkaline Phos 38 - 126 U/L   53   AST 15 - 41 U/L   20   ALT 0 - 44 U/L   21     Imaging: CT CHEST WO CONTRAST  Result Date: 11/16/2021 CLINICAL DATA:  Dyspnea, chronic, in clear etiology. EXAM: CT CHEST WITHOUT CONTRAST TECHNIQUE: Multidetector CT imaging of the chest was performed following the standard protocol without IV contrast. RADIATION DOSE REDUCTION: This exam was performed according to the departmental dose-optimization program which includes automated exposure control, adjustment of the mA and/or kV according to patient size and/or use of iterative reconstruction technique. COMPARISON:  Chest radiography yesterday.  CT abdomen 11/13/2021 FINDINGS: Cardiovascular: Mild cardiac enlargement. Coronary artery calcification  is noted. Aortic atherosclerotic calcification is noted. Mediastinum/Nodes: No mediastinal or hilar mass or lymphadenopathy. Lungs/Pleura: Moderate size pleural effusions layering dependently with dependent atelectasis, particularly affecting the lower lobes. Non dependent lungs are well aerated. Upper Abdomen: No acute finding. Musculoskeletal: Ordinary mild chronic degenerative changes. IMPRESSION: Cardiomegaly. Coronary artery calcification and aortic atherosclerotic calcification. Moderate size bilateral pleural effusions layering dependently with dependent pulmonary atelectasis, particularly affecting the lower lobes. Electronically Signed   By: Nelson Chimes M.D.   On: 11/16/2021 19:56   IR NEPHROSTOMY PLACEMENT RIGHT  Result Date: 11/16/2021 INDICATION: 76 year old male with solitary functioning right kidney and hydronephrosis in the setting of obstructing ureteral stones. Ureteral stent placement was unsuccessful. He presents for percutaneous nephrostomy tube placement for renal decompression. EXAM: IR NEPHROSTOMY PLACEMENT RIGHT COMPARISON:  None Available. MEDICATIONS: 2 g Rocephin; The  antibiotic was administered in an appropriate time frame prior to skin puncture. ANESTHESIA/SEDATION: Fentanyl 50 mcg IV; 5 mg Valium p.o. Moderate Sedation Time:  13 minutes The patient was continuously monitored during the procedure by the interventional radiology nurse under my direct supervision. CONTRAST:  4m OMNIPAQUE IOHEXOL 300 MG/ML SOLN - administered into the collecting system(s) FLUOROSCOPY: Radiation exposure index: 3 mGy reference air kerma COMPLICATIONS: None immediate. TECHNIQUE: The procedure, risks, benefits, and alternatives were explained to the patient. Questions regarding the procedure were encouraged and answered. The patient understands and consents to the procedure. The right flank was prepped with chlorhexidine in a sterile fashion, and a sterile drape was applied covering the operative field. A sterile gown and sterile gloves were used for the procedure. Local anesthesia was provided with 1% Lidocaine. The right flank was interrogated with ultrasound and the left kidney identified. The kidney is hydronephrotic. A suitable access site on the skin overlying the lower pole, posterior calix was identified. After local mg anesthesia was achieved, a small skin nick was made with an 11 blade scalpel. A 21 gauge Accustick needle was then advanced under direct sonographic guidance into the lower pole of the right kidney. A 0.018 inch wire was advanced under fluoroscopic guidance into the left renal collecting system. The Accustick sheath was then advanced over the wire and a 0.018 system exchanged for a 0.035 system. Gentle hand injection of contrast material confirms placement of the sheath within the renal collecting system. There is significant hydronephrosis. The tract from the scan into the renal collecting system was then dilated serially to 10-French. A 10-French Cook all-purpose drain was then placed and positioned under fluoroscopic guidance. The locking loop is well formed within the  left renal pelvis. The catheter was secured to the skin with 2-0 Prolene and a sterile bandage was placed. Catheter was left to gravity bag drainage. IMPRESSION: Successful placement of a right 10 French percutaneous nephrostomy tube. Electronically Signed   By: HJacqulynn CadetM.D.   On: 11/16/2021 13:14    Physical Exam  General: Patient comfortably resting in bed, in no acute distress with no oxygen CV: RRR. No m/r/g. No LE edema Pulmonary: Bilateral crackles to lung bases.  Normal work of breathing on room air. Abdominal: Soft, nontender, nondistended Extremities: 2+ pedal pulses.  Trace edema noted to bilateral lower extremities at the ankles Skin: Warm and dry. No obvious rash or lesions.  Assessment/Plan: Jack FINLAYis a 76y.o. 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  Repeat TTE on admission showing improvement in EF to 55 to 40%, grade 2 diastolic dysfunction.  With diuresing every other day, patient has been improving.  Patient is no longer requiring oxygen.  On exam today, patient is not short of breath.  There are some crackles noted to his bilateral lower lung bases.  Patient is satting well in the high 90s on room air.  Pleural effusions likely due to heart failure.  Plan to  continue to diurese patient as this is effective. -Plan diuresing today with 40 mg Lasix oral -Ambulate patient with oxygen monitoring -Strict I's and O's -Telemetry -Daily weights -Closely monitor for any new symptoms  #Iron deficiency anemia  Patient reports fatigue is improving since admission.  Patient denies any shortness of breath.  Patient has been status post four Ferrlecit infusions.  Hemoglobin steady at 8.3.  Patient denies any chest pain, leg swelling, lightheadedness, or dizziness.  This iron deficiency likely was due to poor resorption as patient has history of small bowel resection 2/2 NHL. Of note, after further research, turmeric can also contribute to decreased iron absorption.  This could be contributing to his anemia, as the patient takes turmeric daily. Will also need f/u for colonoscopy, no evidence of blood loss. -Transition to p.o. ferrous sulfate 325 mg every other day -Avoid turmeric -Continue to hold Plavix -Trend CBC -Outpatient GI follow-up for colonoscopy  #Suspected AKI, unclear baseline, resolving #Right kidney hydronephrosis 2/2 urolithiasis , status post placement of right nephrostomy tube CT abdomen pelvis on 11/13/2021 showing moderate/severe right-sided hydronephrosis, right urolithiasis, and severe left renal atrophy. Patient received right nephrostomy tube 9/12 and kidney function has improved with creatinine at 2.01 today, which has been at its lowest during admission. Unclear baseline although normal Cr in 2020. Concerned that AKI is secondary to obstruction from right ureteral stones with atrophic left kidney.  -Status-post right nephrostomy tube, large output reported by patient following procedure -Urology to follow-up outpatient to remove kidney stones and monitor nephrostomy tube -Trend BMP -Monitor urine output  #Second-degree AV block (Mobitz type I) EKG on admission showing second-degree AV block Mobitz type II.  Resolved after urologic  procedure 9/11 but overnight, EP notes that the patient had recurrence of second-degree AV block Mobitz type II.  Heart rate has improved to the 70s with improvement in anemia.  Patient denies any fatigue or dizziness at this time.  Continue to monitor. -Cardiology following, appreciate recs -Pending EP eval, who plan to discharge patient on MCOT -Continue telemetry -Monitor and replete electrolytes  History of CAD? Patient reports a history of abnormal stress test years ago and calcifications found on CT scan prompting his cardiologist to start him on Plavix.  Denies any history of ACS or CVA. Holding Plavix in the setting of symptomatic anemia.  Plan to hold Plavix until follow-up with cardiology outpatient. 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 -Continue Neurontin to 300 mg twice daily   Diet: Heart Healthy IVF: N/A VTE: SCDs Code: Full  Prior to Admission Living  Arrangement: Home Anticipated Discharge Location: Home Barriers to Discharge: Medical stability Dispo: Anticipated discharge in approximately 1-2 day(s).   SignedLeigh Aurora, DO 11/17/2021, 11:36 AM  Pager: [phone] Internal Medicine Teaching Service After 5pm on weekdays and 1pm on weekends: On Call pager: [phone]

## 2021-11-17 NOTE — Progress Notes (Signed)
Tele/chart check: SR mostly 1:1 conduction, he has nocturnal Mobitz II with 2:1 conduction Ambulated yesterday with mobility team, starting HR 86 >> 101 with exertion. BP stable S/p nephrostomy tube yesterday Creat lower today > 2.01  EP will continue to follow from afar as his clinical picture evolves. HR/conduction seems consistent w/vagal behavior. Will continue to plan for now, live monitor at time of discharge.  Tommye Standard, PA-C

## 2021-11-17 NOTE — Progress Notes (Addendum)
Referring Physician(s): Dr York Spaniel  Supervising Physician: Sandi Mariscal  Patient Status:  [facility] - In-pt  Chief Complaint:  Rt Hydronephrosis  Subjective:  Successful placement of a right 10 French percutaneous nephrostomy Tube in IR 11/16/21 Pt has done well overnight OP in bag 100 cc yellow urine Pt has been emptying his own bag--- not sure of total OP  Allergies: Levofloxacin and Midazolam  Medications: Prior to Admission medications   Medication Sig Start Date End Date Taking? Authorizing Provider  acetaminophen (TYLENOL) 325 MG tablet Take 2 tablets (650 mg total) by mouth every 6 (six) hours. Patient taking differently: Take 650 mg by mouth every 6 (six) hours as needed for moderate pain. 07/13/18  Yes Isabelle Course, MD  gabapentin (NEURONTIN) 600 MG tablet Take 600 mg by mouth 2 (two) times daily.  04/18/18  Yes [provider]  Multiple Vitamin (MULTIVITAMIN WITH MINERALS) TABS tablet Take 1 tablet by mouth daily. 07/14/18  Yes Isabelle Course, MD  Turmeric 500 MG CAPS Take 500 mg by mouth 2 (two) times daily.   Yes [provider]  valsartan (DIOVAN) 40 MG tablet Take 40 mg by mouth daily as needed (For blood pressure). 10/15/21  Yes [provider]  clopidogrel (PLAVIX) 75 MG tablet Take 75 mg by mouth daily. Patient not taking: Reported on 11/12/2021 04/18/18   [provider]  methocarbamol (ROBAXIN) 500 MG tablet Take 1 tablet (500 mg total) by mouth every 8 (eight) hours as needed for muscle spasms. Patient not taking: Reported on 11/12/2021 07/13/18   Isabelle Course, MD  rosuvastatin (CRESTOR) 10 MG tablet Take 10 mg by mouth daily. Patient not taking: Reported on 11/12/2021 04/18/18   [provider]  saccharomyces boulardii (DAILY PROBIOTIC SUPPLEMENT) 250 MG capsule Take 250 mg by mouth 2 (two) times daily. FOR SUPPLEMENT(DO NOT CRUSH) Patient not taking: Reported on 11/12/2021    [provider]     Vital Signs: BP  115/72 (BP Location: Left Arm)   Pulse 73   Temp 97.9 F (36.6 C) (Oral)   Resp 16   Ht '5\' 8"'$  (1.727 m)   Wt 176 lb (79.8 kg)   SpO2 95%   BMI 26.76 kg/m   Physical Exam Skin:    General: Skin is warm.     Comments: Site is clean and dry NT No bleeding OP 100 cc in bag ?? OP --- pt has emptied his own collection bag     Imaging: CT CHEST WO CONTRAST  Result Date: 11/16/2021 CLINICAL DATA:  Dyspnea, chronic, in clear etiology. EXAM: CT CHEST WITHOUT CONTRAST TECHNIQUE: Multidetector CT imaging of the chest was performed following the standard protocol without IV contrast. RADIATION DOSE REDUCTION: This exam was performed according to the departmental dose-optimization program which includes automated exposure control, adjustment of the mA and/or kV according to patient size and/or use of iterative reconstruction technique. COMPARISON:  Chest radiography yesterday.  CT abdomen 11/13/2021 FINDINGS: Cardiovascular: Mild cardiac enlargement. Coronary artery calcification is noted. Aortic atherosclerotic calcification is noted. Mediastinum/Nodes: No mediastinal or hilar mass or lymphadenopathy. Lungs/Pleura: Moderate size pleural effusions layering dependently with dependent atelectasis, particularly affecting the lower lobes. Non dependent lungs are well aerated. Upper Abdomen: No acute finding. Musculoskeletal: Ordinary mild chronic degenerative changes. IMPRESSION: Cardiomegaly. Coronary artery calcification and aortic atherosclerotic calcification. Moderate size bilateral pleural effusions layering dependently with dependent pulmonary atelectasis, particularly affecting the lower lobes. Electronically Signed   By: Jan Fireman.D.  On: 11/16/2021 19:56   IR NEPHROSTOMY PLACEMENT RIGHT  Result Date: 11/16/2021 INDICATION: 76 year old male with solitary functioning right kidney and hydronephrosis in the setting of obstructing ureteral stones. Ureteral stent placement was unsuccessful. He  presents for percutaneous nephrostomy tube placement for renal decompression. EXAM: IR NEPHROSTOMY PLACEMENT RIGHT COMPARISON:  None Available. MEDICATIONS: 2 g Rocephin; The antibiotic was administered in an appropriate time frame prior to skin puncture. ANESTHESIA/SEDATION: Fentanyl 50 mcg IV; 5 mg Valium p.o. Moderate Sedation Time:  13 minutes The patient was continuously monitored during the procedure by the interventional radiology nurse under my direct supervision. CONTRAST:  76m OMNIPAQUE IOHEXOL 300 MG/ML SOLN - administered into the collecting system(s) FLUOROSCOPY: Radiation exposure index: 3 mGy reference air kerma COMPLICATIONS: None immediate. TECHNIQUE: The procedure, risks, benefits, and alternatives were explained to the patient. Questions regarding the procedure were encouraged and answered. The patient understands and consents to the procedure. The right flank was prepped with chlorhexidine in a sterile fashion, and a sterile drape was applied covering the operative field. A sterile gown and sterile gloves were used for the procedure. Local anesthesia was provided with 1% Lidocaine. The right flank was interrogated with ultrasound and the left kidney identified. The kidney is hydronephrotic. A suitable access site on the skin overlying the lower pole, posterior calix was identified. After local mg anesthesia was achieved, a small skin nick was made with an 11 blade scalpel. A 21 gauge Accustick needle was then advanced under direct sonographic guidance into the lower pole of the right kidney. A 0.018 inch wire was advanced under fluoroscopic guidance into the left renal collecting system. The Accustick sheath was then advanced over the wire and a 0.018 system exchanged for a 0.035 system. Gentle hand injection of contrast material confirms placement of the sheath within the renal collecting system. There is significant hydronephrosis. The tract from the scan into the renal collecting system was  then dilated serially to 10-French. A 10-French Cook all-purpose drain was then placed and positioned under fluoroscopic guidance. The locking loop is well formed within the left renal pelvis. The catheter was secured to the skin with 2-0 Prolene and a sterile bandage was placed. Catheter was left to gravity bag drainage. IMPRESSION: Successful placement of a right 10 French percutaneous nephrostomy tube. Electronically Signed   By: HJacqulynn CadetM.D.   On: 11/16/2021 13:14   DG C-Arm 1-60 Min  Result Date: 11/15/2021 CLINICAL DATA:  Cystoscopy with retrograde pyelogram. EXAM: DG C-ARM 1-60 MIN FLUOROSCOPY: Radiation Exposure Index (if provided by the fluoroscopic device): 37.8 mGy COMPARISON:  None Available. FINDINGS: Three images demonstrate contrast in the right distal ureter. Two of the images show a wire in the right distal ureter. IMPRESSION: 1. Contrast injection of the right ureter and passage of a wire in the right distal ureter. Electronically Signed   By: WVan ClinesM.D.   On: 11/15/2021 19:31   DG CHEST PORT 1 VIEW  Result Date: 11/15/2021 CLINICAL DATA:  Hypoxemia.  Non-Hodgkin's lymphoma. EXAM: PORTABLE CHEST 1 VIEW COMPARISON:  Overlapping portion abdomen CT from 11/13/2021 FINDINGS: Layering bilateral pleural effusions and associated passive atelectasis. Atherosclerotic calcification of the aortic arch. Heart size within normal limits. Difficult to completely exclude the possibility of superimposed airspace opacity beyond the pleural effusions and passive atelectasis, especially in the left lower lobe-cannot exclude left lower lobe pneumonia. IMPRESSION: 1. Layering pleural effusions with passive atelectasis. 2. Left lower lobe airspace opacity, cannot exclude pneumonia. 3.  Aortic Atherosclerosis (ICD10-I70.0).  Electronically Signed   By: Van Clines M.D.   On: 11/15/2021 19:29   CT ABDOMEN PELVIS WO CONTRAST  Addendum Date: 11/13/2021   ADDENDUM REPORT: 11/13/2021  20:42 ADDENDUM: These results were called by telephone at the time of interpretation on 11/13/2021 at 8:38 pm to provider Dr. Jodell Cipro , who verbally acknowledged these results. Electronically Signed   By: Ronney Asters M.D.   On: 11/13/2021 20:42   Result Date: 11/13/2021 CLINICAL DATA:  Concern for recurrence of non-Hodgkin's lymphoma. Patient is status post small bowel resection. EXAM: CT ABDOMEN AND PELVIS WITHOUT CONTRAST TECHNIQUE: Multidetector CT imaging of the abdomen and pelvis was performed following the standard protocol without IV contrast. RADIATION DOSE REDUCTION: This exam was performed according to the departmental dose-optimization program which includes automated exposure control, adjustment of the mA and/or kV according to patient size and/or use of iterative reconstruction technique. COMPARISON:  CT abdomen and pelvis 06/29/2018 FINDINGS: Lower chest: There are moderate-sized bilateral pleural effusions with compressive atelectasis in the bilateral lower lobes. Hepatobiliary: There is diffuse gallbladder wall thickening with surrounding inflammation. No gallstones are seen. No biliary ductal dilatation. The liver is within normal limits. Pancreas: Unremarkable. No pancreatic ductal dilatation or surrounding inflammatory changes. Spleen: Normal in size without focal abnormality. Adrenals/Urinary Tract: Punctate calcifications in the right adrenal gland are unchanged likely related to prior infection or hemorrhage. Left adrenal gland is within normal limits. Severe left renal atrophy is unchanged. There are 2 calculi in the proximal left ureter. The more distal measures 9 mm in the more proximal measures 3 mm. There is resultant moderate/severe right-sided hydronephrosis. There are additional nonobstructing right renal calculi measuring up to 6 mm. There is a 3 mm calculus in the bladder. The bladder is otherwise within normal limits. Stomach/Bowel: Patient is status post small bowel resection.  Anastomosis is seen in the left mid abdomen. Small bowel loops just proximal to this level demonstrate wall thickening with mesenteric edema. There is no evidence for bowel obstruction or free air. No pneumatosis identified. The appendix is within normal limits. The stomach is within normal limits. Vascular/Lymphatic: Aortic atherosclerosis. No enlarged abdominal or pelvic lymph nodes. Reproductive: Prostate gland is enlarged, unchanged. Other: Fat containing inguinal hernias are again noted. There is a small amount of free fluid in the pelvis. There is mild body wall edema. Musculoskeletal: No acute or significant osseous findings. IMPRESSION: 1. Patient is status post small bowel resection. Small bowel loops proximal to the anastomosis demonstrate wall thickening and mesenteric edema worrisome for nonspecific enteritis including infectious, inflammatory and ischemic etiologies. 2. There are 2 calculi within the mid right ureter measuring up to 9 mm. There is moderate/severe right-sided hydronephrosis. 3. Gallbladder wall thickening with surrounding inflammation concerning for cholecystitis. 4. Small volume ascites. 5. Bladder calculi and right renal calculi. 6. Moderate-sized bilateral pleural effusions. 7.  Aortic Atherosclerosis (ICD10-I70.0). Electronically Signed: By: Ronney Asters M.D. On: 11/13/2021 20:17   ECHOCARDIOGRAM COMPLETE  Result Date: 11/13/2021    ECHOCARDIOGRAM REPORT   Patient Name:   [facility] Fuentes Date of Exam: 11/13/2021 Medical Rec #:  [id number]         Height:       68.0 in Accession #:    [id number]        Weight:       176.0 lb Date of Birth:  04/02/1945         BSA:          1.936 m Patient Age:  76 years          BP:           112/66 mmHg Patient Gender: M                 HR:           74 bpm. Exam Location:  Inpatient Procedure: 2D Echo, Color Doppler and Cardiac Doppler Indications:    Heart block, 2nd degree  History:        Patient has prior history of Echocardiogram  examinations, most                 recent 07/31/2018. Arrythmias:LBBB; Risk Factors:Dyslipidemia.  Sonographer:    Memory Argue Referring Phys: [place]  1. Left ventricular ejection fraction, by estimation, is 55 to 60%. The left ventricle has normal function. The left ventricle has no regional wall motion abnormalities. Left ventricular diastolic parameters are consistent with Grade II diastolic dysfunction (pseudonormalization). Elevated left atrial pressure.  2. Right ventricular systolic function is normal. The right ventricular size is normal. There is moderately elevated pulmonary artery systolic pressure.  3. Left atrial size was mild to moderately dilated.  4. The mitral valve is abnormal. Moderate mitral valve regurgitation. No evidence of mitral stenosis.  5. The tricuspid valve is abnormal. Tricuspid valve regurgitation is mild to moderate.  6. The aortic valve was not well visualized. There is mild calcification of the aortic valve. There is mild thickening of the aortic valve. Aortic valve regurgitation is mild to moderate. No aortic stenosis is present.  7. The inferior vena cava is dilated in size with >50% respiratory variability, suggesting right atrial pressure of 8 mmHg. FINDINGS  Left Ventricle: Left ventricular ejection fraction, by estimation, is 55 to 60%. The left ventricle has normal function. The left ventricle has no regional wall motion abnormalities. The left ventricular internal cavity size was normal in size. There is  no left ventricular hypertrophy. Left ventricular diastolic parameters are consistent with Grade II diastolic dysfunction (pseudonormalization). Elevated left atrial pressure. Right Ventricle: The right ventricular size is normal. Right vetricular wall thickness was not well visualized. Right ventricular systolic function is normal. There is moderately elevated pulmonary artery systolic pressure. The tricuspid regurgitant velocity is 3.27 m/s,  and with an assumed right atrial pressure of 8 mmHg, the estimated right ventricular systolic pressure is 73.5 mmHg. Left Atrium: Left atrial size was mild to moderately dilated. Right Atrium: Right atrial size was normal in size. Pericardium: There is no evidence of pericardial effusion. Mitral Valve: The mitral valve is abnormal. There is mild thickening of the mitral valve leaflet(s). There is mild calcification of the mitral valve leaflet(s). Mild mitral annular calcification. Moderate mitral valve regurgitation. No evidence of mitral  valve stenosis. MV peak gradient, 8.1 mmHg. The mean mitral valve gradient is 4.0 mmHg. Tricuspid Valve: The tricuspid valve is abnormal. Tricuspid valve regurgitation is mild to moderate. No evidence of tricuspid stenosis. Aortic Valve: The aortic valve was not well visualized. There is mild calcification of the aortic valve. There is mild thickening of the aortic valve. There is mild aortic valve annular calcification. Aortic valve regurgitation is mild to moderate. Aortic regurgitation PHT measures 423 msec. No aortic stenosis is present. Aortic valve mean gradient measures 5.0 mmHg. Aortic valve peak gradient measures 8.9 mmHg. Aortic valve area, by VTI measures 2.44 cm. Pulmonic Valve: The pulmonic valve was not well visualized. Pulmonic valve regurgitation is not visualized. No evidence of  pulmonic stenosis. Aorta: The aortic root is normal in size and structure. Venous: The inferior vena cava is dilated in size with greater than 50% respiratory variability, suggesting right atrial pressure of 8 mmHg. IAS/Shunts: No atrial level shunt detected by color flow Doppler.  LEFT VENTRICLE PLAX 2D LVIDd:         4.20 cm   Diastology LVIDs:         3.00 cm   LV e' medial:    5.55 cm/s LV PW:         0.90 cm   LV E/e' medial:  24.9 LV IVS:        0.90 cm   LV e' lateral:   9.68 cm/s LVOT diam:     2.20 cm   LV E/e' lateral: 14.3 LV SV:         73 LV SV Index:   38 LVOT Area:     3.80  cm  RIGHT VENTRICLE RV S prime:     11.10 cm/s TAPSE (M-mode): 1.9 cm LEFT ATRIUM             Index        RIGHT ATRIUM           Index LA diam:        3.50 cm 1.81 cm/m   RA Area:     14.40 cm LA Vol (A2C):   69.8 ml 36.06 ml/m  RA Volume:   31.80 ml  16.43 ml/m LA Vol (A4C):   91.9 ml 47.48 ml/m LA Biplane Vol: 80.8 ml 41.74 ml/m  AORTIC VALVE AV Area (Vmax):    2.47 cm AV Area (Vmean):   2.39 cm AV Area (VTI):     2.44 cm AV Vmax:           149.00 cm/s AV Vmean:          101.000 cm/s AV VTI:            0.301 m AV Peak Grad:      8.9 mmHg AV Mean Grad:      5.0 mmHg LVOT Vmax:         97.00 cm/s LVOT Vmean:        63.400 cm/s LVOT VTI:          0.193 m LVOT/AV VTI ratio: 0.64 AI PHT:            423 msec  AORTA Ao Root diam: 3.30 cm MITRAL VALVE                 TRICUSPID VALVE MV Area (PHT): 4.80 cm      TR Peak grad:   42.8 mmHg MV Area VTI:   2.08 cm      TR Vmax:        327.00 cm/s MV Peak grad:  8.1 mmHg MV Mean grad:  4.0 mmHg      SHUNTS MV Vmax:       1.42 m/s      Systemic VTI:  0.19 m MV Vmean:      93.0 cm/s     Systemic Diam: 2.20 cm MV Decel Time: 158 msec MR Peak grad:   71.0 mmHg MR Mean grad:   56.0 mmHg MR Vmax:        421.40 cm/s MR Vmean:       352.0 cm/s MR PISA:        0.57 cm MR PISA Radius: 0.30 cm MV E velocity: 138.00 cm/s MV  A velocity: 72.40 cm/s MV E/A ratio:  1.91 Carlyle Dolly MD Electronically signed by Carlyle Dolly MD Signature Date/Time: 11/13/2021/1:31:52 PM    Final     Labs:  CBC: Recent Labs    11/14/21 0408 11/15/21 0323 11/16/21 0422 11/17/21 0441  WBC 7.9 7.6 6.9 7.9  HGB 8.2* 8.4* 8.4* 8.3*  HCT 27.6* 27.6* 28.2* 28.7*  PLT 124* 124* 123* 128*    COAGS: Recent Labs    11/15/21 2054  INR 1.5*  APTT 40*    BMP: Recent Labs    11/14/21 0408 11/15/21 0323 11/16/21 0422 11/17/21 0441  NA 136 136 136 137  K 4.2 4.5 4.2 4.0  CL 110 112* 110 110  CO2 18* 18* 18* 19*  GLUCOSE 88 86 147* 83  BUN 35* 31* 27* 24*  CALCIUM 8.4* 8.5*  8.7* 8.5*  CREATININE 2.16* 2.02* 2.21* 2.01*  GFRNONAA 31* 34* 30* 34*    LIVER FUNCTION TESTS: Recent Labs    11/12/21 1259 11/15/21 2054  BILITOT 0.4 0.7  AST 22 20  ALT 27 21  ALKPHOS 50 53  PROT 6.2* 6.1*  ALBUMIN 3.3* 3.1*    Drain Location: Rt flank Size: Fr size: 10 Fr Date of placement: 11/16/21  Currently to: Drain collection device: gravity 24 hour output:  Output by Drain (mL) 11/15/21 0701 - 11/15/21 1900 11/15/21 1901 - 11/16/21 0700 11/16/21 0701 - 11/16/21 1900 11/16/21 1901 - 11/17/21 0700 11/17/21 0701 - 11/17/21 1133  Requested LDAs do not have output data documented.    Interval imaging/drain manipulation:  none  Current examination: Insertion site unremarkable. Suture and stat lock in place. Dressed appropriately.   Plan: Record output Q shift. Dressing changes QD or PRN if soiled.    Discharge planning: Plan per Dr Cain Sieve- Urology  IR will continue to follow - please call with questions or concerns.   Electronically Signed: Lavonia Drafts, PA-C 11/17/2021, 11:33 AM   I spent a total of 15 Minutes at the the patient's bedside AND on the patient's hospital floor or unit, greater than 50% of which was counseling/coordinating care for Rt PCN

## 2021-11-18 ENCOUNTER — Inpatient Hospital Stay (HOSPITAL_BASED_OUTPATIENT_CLINIC_OR_DEPARTMENT_OTHER)
Admit: 2021-11-18 | Discharge: 2021-11-18 | Disposition: A | Discharge status: Home / Self Care | Payer: Medicare Other | Attending: Physician Assistant | Admitting: Physician Assistant

## 2021-11-18 ENCOUNTER — Other Ambulatory Visit (HOSPITAL_COMMUNITY): Payer: Self-pay

## 2021-11-18 DIAGNOSIS — J90 Pleural effusion, not elsewhere classified: Secondary | ICD-10-CM

## 2021-11-18 DIAGNOSIS — R001 Bradycardia, unspecified: Secondary | ICD-10-CM

## 2021-11-18 DIAGNOSIS — D509 Iron deficiency anemia, unspecified: Secondary | ICD-10-CM | POA: Diagnosis not present

## 2021-11-18 DIAGNOSIS — I441 Atrioventricular block, second degree: Secondary | ICD-10-CM | POA: Diagnosis not present

## 2021-11-18 LAB — CBC WITH DIFFERENTIAL/PLATELET
Abs Immature Granulocytes: 0.04 10*3/uL (ref 0.00–0.07)
Basophils Absolute: 0 10*3/uL (ref 0.0–0.1)
Basophils Relative: 0 %
Eosinophils Absolute: 0.3 10*3/uL (ref 0.0–0.5)
Eosinophils Relative: 4 %
HCT: 31.5 % — ABNORMAL LOW (ref 39.0–52.0)
Hemoglobin: 9.3 g/dL — ABNORMAL LOW (ref 13.0–17.0)
Immature Granulocytes: 1 %
Lymphocytes Relative: 16 %
Lymphs Abs: 1.2 10*3/uL (ref 0.7–4.0)
MCH: 25.1 pg — ABNORMAL LOW (ref 26.0–34.0)
MCHC: 29.5 g/dL — ABNORMAL LOW (ref 30.0–36.0)
MCV: 84.9 fL (ref 80.0–100.0)
Monocytes Absolute: 1 10*3/uL (ref 0.1–1.0)
Monocytes Relative: 13 %
Neutro Abs: 5 10*3/uL (ref 1.7–7.7)
Neutrophils Relative %: 66 %
Platelets: 117 10*3/uL — ABNORMAL LOW (ref 150–400)
RBC: 3.71 MIL/uL — ABNORMAL LOW (ref 4.22–5.81)
RDW: 21 % — ABNORMAL HIGH (ref 11.5–15.5)
WBC: 7.5 10*3/uL (ref 4.0–10.5)
nRBC: 0 % (ref 0.0–0.2)

## 2021-11-18 LAB — MAGNESIUM: Magnesium: 1.7 mg/dL (ref 1.7–2.4)

## 2021-11-18 LAB — BASIC METABOLIC PANEL
Anion gap: 6 (ref 5–15)
BUN: 21 mg/dL (ref 8–23)
CO2: 21 mmol/L — ABNORMAL LOW (ref 22–32)
Calcium: 8.6 mg/dL — ABNORMAL LOW (ref 8.9–10.3)
Chloride: 110 mmol/L (ref 98–111)
Creatinine, Ser: 1.71 mg/dL — ABNORMAL HIGH (ref 0.61–1.24)
GFR, Estimated: 41 mL/min — ABNORMAL LOW (ref >60)
Glucose, Bld: 78 mg/dL (ref 70–99)
Potassium: 4 mmol/L (ref 3.5–5.1)
Sodium: 137 mmol/L (ref 135–145)

## 2021-11-18 MED ORDER — FUROSEMIDE 40 MG PO TABS
40.0000 mg | ORAL_TABLET | Freq: Every day | ORAL | 11 refills | Status: AC
  Filled 2021-11-18: qty 30, 30d supply, fill #0

## 2021-11-18 MED ORDER — GABAPENTIN 300 MG PO CAPS
300.0000 mg | ORAL_CAPSULE | Freq: Two times a day (BID) | ORAL | 5 refills | Status: AC
  Filled 2021-11-18: qty 60, 30d supply, fill #0

## 2021-11-18 MED ORDER — FERROUS SULFATE 325 (65 FE) MG PO TABS
325.0000 mg | ORAL_TABLET | ORAL | 3 refills | Status: AC
  Filled 2021-11-18: qty 30, 60d supply, fill #0

## 2021-11-18 NOTE — Plan of Care (Signed)

## 2021-11-18 NOTE — Discharge Summary (Addendum)
Name: Jack Fuentes MRN: [id number] DOB: 08/11/1945 76 y.o. PCP: [facility], [place]  Date of Admission: 11/12/2021 12:34 PM Date of Discharge: 11/18/21 Attending Physician: Dr. Charise Killian   Discharge Diagnosis: Principal Problem:   Symptomatic anemia Active Problems:   Acute renal failure superimposed on stage 4 chronic kidney disease (HCC)   AV block, Mobitz 2    Discharge Medications: Allergies as of 11/18/2021       Reactions   Levofloxacin Swelling   Midazolam Swelling        Medication List     STOP taking these medications    clopidogrel 75 MG tablet Commonly known as: PLAVIX   Daily Probiotic Supplement 250 MG capsule Generic drug: saccharomyces boulardii   gabapentin 600 MG tablet Commonly known as: NEURONTIN Replaced by: gabapentin 300 MG capsule   methocarbamol 500 MG tablet Commonly known as: ROBAXIN   rosuvastatin 10 MG tablet Commonly known as: CRESTOR   Turmeric 500 MG Caps   valsartan 40 MG tablet Commonly known as: DIOVAN       TAKE these medications    acetaminophen 325 MG tablet Commonly known as: TYLENOL Take 2 tablets (650 mg total) by mouth every 6 (six) hours. What changed:  when to take this reasons to take this   FeroSul 325 (65 FE) MG tablet Generic drug: ferrous sulfate Take 1 tablet (325 mg total) by mouth every other day. Start taking on: November 19, 2021   furosemide 40 MG tablet Commonly known as: Lasix Take 1 tablet (40 mg total) by mouth daily.   gabapentin 300 MG capsule Commonly known as: NEURONTIN Take 1 capsule (300 mg total) by mouth 2 (two) times daily. Replaces: gabapentin 600 MG tablet   multivitamin with minerals Tabs tablet Take 1 tablet by mouth daily.        Disposition and follow-up:   Mr.Jeramiah ZACKARY MCKEONE was discharged from [facility] in Good condition.  At the hospital follow up visit please address:  1.  Follow-up:  a.  Moderate  bilateral pleural effusions: Patient diuresed in the hospital with improvement in shortness of breath.  Patient discharged on Lasix 40 mg daily.  Please assess need for continued diuresis, ensure patient continues to improve, and shortness of breath does not recur.    b.  Iron deficiency anemia: Patient received 3 units of blood in the hospital and received 4 iron infusions in hospital and discharged on oral iron supplements every other day.  Patient to follow-up with gastroenterology for possible colonoscopy. Please repeat CBC to ensure hemoglobin continues to improve.  Ensure no active bleeding.   c.  Intermittent second-degree AV block Mobitz type II: Patient was found to have second-degree AV block Mobitz type II on initial admission.  Electrophysiology was following, and noted that this was only nocturnal.  Patient sent home on Zio patch.  Electrophysiology to follow-up outpatient.  Monitor for lightheadedness and dizziness.   d.  AKI likely secondary to obstruction from stones causing right hydronephrosis: Patient's obstruction was relieved with right nephrostomy tube placed.  Creatinine trended down during admission.  Patient urine output was good.  Continue to monitor for resolution of AKI with BMP. F/u with urology for definitive management of urolithiasis and nephrostomy tube.   e) Abnormal thyroid function test: Patient was noted to have mildly elevated TSH at 5 and mildly elevated T4 at 1.35.  Continue to monitor outpatient.   f) Questionable history of coronary artery disease: Patient was  initially on Plavix on admission, and given questionable GI bleed, Plavix was held.  Questionable history on if patient truly has coronary artery disease or not.  Plavix continued to be held on discharge and to follow-up outpatient with cardiology.  2.  Labs / imaging needed at time of follow-up: CBC and BMP   3.  Pending labs/ test needing follow-up: N/A  4.  Medication Changes  1) Start taking Lasix  40 mg daily  2) Start taking ferrous sulfate 325 mg every other day  3) Change Neurontin to 300 mg twice daily until kidney function improves  Follow-up Appointments:  Follow-up Information     Zehr, Laban Emperor, PA-C Follow up on 12/20/2021.   Specialty: Gastroenterology Why: Please go to this appointment with the Stomach doctors at 10:30am on 12/20/2021 Contact information: [place] [address] [phone]         Mealor, Yetta Barre, MD. Go on 12/21/2021.   Specialty: Cardiology Why: Please go to your appointment with the electrophysiologist on 12/21/2021 at 11:30. Contact information: [address] [place] [address] [phone]         Med[facility], [place]. Call today.   Why: To make hospital follow up Contact information: [place] [address] [phone]         Brunetta Genera, MD. Go on 12/08/2021.   Specialties: Hematology, Oncology Why: Please go to your Appointment with Dr. Irene Limbo at 2:30pm Contact information: [place] [address] [phone]         Theora Gianotti, NP. Go on 12/15/2021.   Specialties: Nurse Practitioner, Cardiology, Radiology Why: Please go to your appointment at 3:35pm on 12/15/2021 Contact information: [place] [address] [phone]         Vira Agar, MD. Call today.   Specialty: Urology Why: Please call to schedule follow-up appointment Contact information: [address] [place] [address] [phone]                 Hospital Course by problem list: UNKNOWN Jack Fuentes is a 76 y.o. 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  Repeat TTE on admission showing improvement in EF to 55 to 44%, grade 2 diastolic dysfunction.  With diuresis, patient has been improving.  Patient is no longer requiring oxygen.  Patient improved during hospitalization with diuresis.  Plan to discharge the patient on Lasix for symptomatic diuresis, continued need to be reassessed outpatient.  Patient will also need evaluation with heme oncology to ensure there is no active malignancy.   #Iron deficiency anemia  Patient initially admitted for symptomatic anemia, which improved during admission.  Patient had very low ferritin.  This could be multifactorial given patient had a small bowel resection back in 2020-2021 and also takes turmeric daily.  During admission, patient got 4 infusions of iron and transition to oral 325 mg ferrous sulfate.  Patient to follow-up outpatient to monitor hemoglobin.  Patient also follow-up with  gastroenterology outpatient for colonoscopy given no history of colonoscopy and iron deficiency anemia.  Continue outpatient ferrous sulfate.   #Suspected AKI, unclear baseline, resolving #Right kidney hydronephrosis 2/2 urolithiasis , status post placement of right nephrostomy tube CT abdomen pelvis on 11/13/2021 showing moderate/severe right-sided hydronephrosis, right urolithiasis, and severe left renal atrophy. Patient received right nephrostomy tube 9/12 and kidney function has improved with creatinine trending down during hospitalization.  Unclear baseline although normal Cr in 2020.  Plan for urologic follow-up for stones.   #Second-degree AV block (Mobitz type I) EKG on admission showing  second-degree AV block Mobitz type II.  Initially resolved, but then returned back overnight on 11/17/2021.  Electrophysiology did follow the patient inpatient who recommended the patient receive a Zio patch on discharge and follow-up outpatient.  Patient had no concerns of dizziness or lightheadedness during hospitalization.    #Abnormal Thyroid functions tests During the hospitalization patient had mild abnormalities in his thyroid function tests with TSH at 5 and T4 at 1.35. Plan to follow up outpatient.   History of CAD? Patient reports a history of abnormal stress test years ago and calcifications found on CT scan prompting his cardiologist to start him on Plavix.  Given concern initially for acute bleed, his Plavix was held.  Patient to be discharged without Plavix. Patient to follow-up with cardiology to see if aspirin or Plavix is appropriate for patient outpatient.   #Peripheral neuropathy There was no concern about this during the hospitalization. Patient initially was on 600 mg Neurontin at home but this was reduced to 300 mg twice daily because of acute kidney injury. Patient to be discharge on 300 mg Neurontin twice daily.      Discharge Subjective:  Patient is resting comfortably in bed upon my exam.  He states no concerns today.  He denies any chest pain or shortness of breath.  He denies any leg swelling.  He reports that he is able to ambulate without any shortness of breath.  He reports a good diet.  He states no complaints at this time and is ready go home.  Discharge Exam:   BP (!) 100/50 (BP Location: Right Arm)   Pulse 68   Temp 98.6 F (37 C) (Oral)   Resp 18   Ht '5\' 8"'$  (1.727 m)   Wt 79.8 kg   SpO2 91%   BMI 26.76 kg/m  Constitutional: Patient resting well in bed in no acute distress HENT: normocephalic atraumatic Cardiovascular: regular rate and rhythm, no m/r/g Pulmonary/Chest: Mild crackles heard to right lower lung base, otherwise clear to auscultation  bilaterally. Extremities: No lower extremity edema appreciated  Pertinent Labs, Studies, and Procedures:     Latest Ref Rng & Units 11/18/2021    2:31 AM 11/17/2021    4:41 AM 11/16/2021    4:22 AM  CBC  WBC 4.0 - 10.5 K/uL 7.5  7.9  6.9   Hemoglobin 13.0 - 17.0 g/dL 9.3  8.3  8.4   Hematocrit 39.0 - 52.0 % 31.5  28.7  28.2   Platelets 150 - 400 K/uL 117  128  123        Latest Ref Rng & Units 11/18/2021    4:30 AM 11/17/2021    4:41 AM 11/16/2021    4:22 AM  CMP  Glucose 70 - 99 mg/dL 78  83  147   BUN 8 - 23 mg/dL '21  24  27   '$ Creatinine 0.61 - 1.24 mg/dL 1.71  2.01  2.21  Sodium 135 - 145 mmol/L 137  137  136   Potassium 3.5 - 5.1 mmol/L 4.0  4.0  4.2   Chloride 98 - 111 mmol/L 110  110  110   CO2 22 - 32 mmol/L '21  19  18   '$ Calcium 8.9 - 10.3 mg/dL 8.6  8.5  8.7     No results found.   Discharge Instructions:  Mr. Alvan Culpepper, It was a pleasure taking care of you at [facility]. Now that you are doing better, we are going to send you home. Here are your instructions after getting discharged form the hospital.   1) Regarding our shortness of breath, we are thinking that was multifactorial with Anemia and with the fluid on your lungs. We have given you water pills during your hospital stay to help you get some of that fluid off and this has helped your shortness of breath. We will send you home on Lasix 40 mg daily.  2) Regarding your anemia, you needed 3 units of blood in the hospital. WE have also given you 4 iron infusions in the hospital. We will send you home on 325 mg Ferrous sulfate to take once a day. Please avoid tumeric as this can affect the absorption of iron. I have also set you up with an appointment with the stomach doctors here. Please go to your appointment with Alonza Bogus with [facility] on 12/20/2021 at 10:30am.   3) Regarding your second-degree AV block, please follow-up with Dr. Myles Gip on 12/21/2021 at 11:30 AM.  Please show up.     4) Regarding your questionable history of coronary artery disease, please follow-up with the cardiologists. You have an appointment with Murray Hodgkins, NP on 12/15/2021.  Please show up.  5) Regarding your history of non-Hodgkin's lymphoma, we have set you up with the hematologist/oncologist here at [facility] health.  You have an appointment with Dr. Irene Limbo on 12/08/2021 at 2:30 PM.  Please show up.  6) Regarding the Nephrostomy tube placement and kidney stone management, please reach out to [facility] with Dr. Cain Sieve to schedule outpatient follow up.  7) Regarding the mildly abnormal Thyroid tests, please follow up with your PCP to be evaluated.   Take Care, Leigh Aurora, DO Discharge Instructions     Call MD for:  extreme fatigue   Complete by: As directed    Call MD for:  persistant dizziness or light-headedness   Complete by: As directed    Diet - low sodium heart healthy   Complete by: As directed    Increase activity slowly   Complete by: As directed    No wound care   Complete by: As directed        Signed: Leigh Aurora, DO 11/18/2021, 1:49 PM   Pager: [phone]

## 2021-11-18 NOTE — Progress Notes (Signed)
Explained discharge instructions to the patient. Reviewed follow up appointments and next medication administration times. Patient verbalized having an understanding of the discharge instructions. Patient's IV and telemetry were removed by floor staff prior to my explaining the discharge instructions. Patient's wallet to include his money and his personal medication was returned and in his possession. Signed patient valuables  and home medication sheets are on the patient's chart.

## 2021-11-18 NOTE — Progress Notes (Cosign Needed Addendum)
Telemetry reviewed. No AV block, day or nighttime. Maintaining SR with 1:1 conduction 60's-80's VSS  No pacemaker at this time. I have ordered a Zio AT monitor for him. Please reach out to EP when discharge timing is known and will ask monitor to be placed prior to him going home   Avoid AV nodal blocking drugs  EP office follow up is in place.  Tommye Standard, PA-C

## 2021-11-18 NOTE — Progress Notes (Signed)
Jack Fuentes has successfully demonstrated how to empty and measure his nephrostomy tube output, signs and symptoms when to call MD as well.

## 2021-12-08 ENCOUNTER — Ambulatory Visit: Payer: Medicare Other | Admitting: Hematology

## 2021-12-15 ENCOUNTER — Ambulatory Visit: Payer: Medicare Other | Admitting: Nurse Practitioner

## 2021-12-17 NOTE — Addendum Note (Signed)
Encounter addended by: Carylon Perches, CMA on: 12/17/2021 4:00 PM  Actions taken: Imaging Exam ended

## 2021-12-20 ENCOUNTER — Ambulatory Visit (INDEPENDENT_AMBULATORY_CARE_PROVIDER_SITE_OTHER): Payer: Medicare Other | Admitting: Gastroenterology

## 2021-12-20 ENCOUNTER — Encounter: Payer: Self-pay | Admitting: Gastroenterology

## 2021-12-20 ENCOUNTER — Other Ambulatory Visit (HOSPITAL_COMMUNITY): Payer: Self-pay

## 2021-12-20 VITALS — BP 110/76 | HR 76 | Ht 68.0 in | Wt 149.4 lb

## 2021-12-20 DIAGNOSIS — D509 Iron deficiency anemia, unspecified: Secondary | ICD-10-CM | POA: Diagnosis not present

## 2021-12-20 MED ORDER — NA SULFATE-K SULFATE-MG SULF 17.5-3.13-1.6 GM/177ML PO SOLN: 1.0000 | Freq: Once | ORAL | 0 refills | Status: AC

## 2021-12-20 NOTE — Patient Instructions (Signed)
You have been scheduled for an endoscopy and colonoscopy. Please follow the written instructions given to you at your visit today. Please pick up your prep supplies at the pharmacy within the next 1-3 days. If you use inhalers (even only as needed), please bring them with you on the day of your procedure.  _______________________________________________________  If you are age 74 or older, your body mass index should be between 23-30. Your Body mass index is 22.72 kg/m. If this is out of the aforementioned range listed, please consider follow up with your Primary Care Provider.  If you are age 58 or younger, your body mass index should be between 19-25. Your Body mass index is 22.72 kg/m. If this is out of the aformentioned range listed, please consider follow up with your Primary Care Provider.   ________________________________________________________  [facility] providers would like to encourage you to use [facility] to communicate with providers for non-urgent requests or questions.  Due to long hold times on the telephone, sending your provider a message by [facility] may be a faster and more efficient way to get a response.  Please allow 48 business hours for a response.  Please remember that this is for non-urgent requests.  _______________________________________________________

## 2021-12-20 NOTE — Progress Notes (Signed)
Cardiology Office Note:    Date:  12/20/2021   ID:  Jack Fuentes, Jack Fuentes [birth year], MRN [id number]  PCP:  [facility], [facility]   [facility] Providers Cardiologist:  Meriam Sprague, MD Electrophysiologist:  Maurice Small, MD     Referring MD: [facility], [place]*   Chief complaint: here for follow-up  History of Present Illness:    Jack Fuentes is a 76 y.o. 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  He was admitted in Sept 2023 with profound normocytic anemia. His symptoms on admission were shortness of breath and fatigue During his admission, he was noted to have intermittent second degree heart block attributed to vagal episodes. On ambulation, his heart rate would increase to > 100 bpm with 1:1 conduction. ZioPatch was placed on discharge. First degree block was noted, but no second degree AV block.    Past Medical History:  Diagnosis Date   Anemia 10/19/2021   Cancer (HCC)    lymphoma-large B-cell   Chronic kidney disease (CKD) 2020   History of left bundle branch block (LBBB)    Hyperlipidemia    Kidney stone 11/2021   Peripheral neuropathy    SBO (small bowel obstruction) (HCC) 06/2018    Past Surgical History:  Procedure Laterality Date   CYSTOSCOPY W/ URETERAL STENT PLACEMENT Right 11/15/2021   Procedure: CYSTOSCOPY WITH RETROGRADE PYELOGRAM;  Surgeon: Despina Arias, MD;  Location: [facility] OR;  Service: Urology;  Laterality: Right;   EYE SURGERY     IR NEPHROSTOMY PLACEMENT RIGHT  11/16/2021   IR PATIENT EVAL TECH 0-60 MINS  08/01/2018   LAPAROTOMY N/A 07/03/2018   Procedure: EXPLORATORY LAPAROTOMY, LYSIS OF ADHESION, BOWEL RESECTION, RESECTION MESTASTATIC TUMOR;  Surgeon: Griselda Miner, MD;  Location: [facility] OR;  Service: General;  Laterality: N/A;   NASAL FRACTURE SURGERY     SMALL BOWEL EMBOLIZATION  06/2018    Current  Medications: No outpatient medications have been marked as taking for the 12/21/21 encounter (Appointment) with Zaryan Yakubov, Roberts Gaudy, MD.     Allergies:   Levofloxacin and Midazolam   Social History   Socioeconomic History   Marital status: Divorced    Spouse name: Not on file   Number of children: 0   Years of education: Not on file   Highest education level: Not on file  Occupational History   Occupation: Retired  Tobacco Use   Smoking status: Never   Smokeless tobacco: Never  Vaping Use   Vaping Use: Never used  Substance and Sexual Activity   Alcohol use: Not Currently   Drug use: Never   Sexual activity: Not on file  Other Topics Concern   Not on file  Social History Narrative   Not on file   Social Determinants of Health   Financial Resource Strain: Not on file  Food Insecurity: Not on file  Transportation Needs: Not on file  Physical Activity: Not on file  Stress: Not on file  Social Connections: Not on file     Family History: The patient's family history includes Heart attack in his father; Heart disease in his father; Kidney disease in his father; Multiple myeloma in his sister. There is no history of Esophageal cancer, Colon cancer, or Liver cancer.  ROS:   Please see the history of present illness.    All other systems reviewed and are negative.  EKGs/Labs/Other Studies  Reviewed:     ZioPatch 12/2021 Patch Wear Time:  14 days and 0 hours (2023-09-14T11:11:24-0400 to 2023-09-28T11:11:24-0400)   Patient had a min HR of 57 bpm, max HR of 194 bpm, and avg HR of 83 bpm. Predominant underlying rhythm was Sinus Rhythm. First Degree AV Block was present. Intermittent Bundle Branch Block was present. QRS morphology changes were present throughout  recording. 2 Ventricular Tachycardia runs occurred, the run with the fastest interval lasting 9 beats with a max rate of 171 bpm (avg 122 bpm); the run with the fastest interval was also the longest. 17  Supraventricular Tachycardia runs occurred, the run  with the fastest interval lasting 5 beats with a max rate of 194 bpm, the longest lasting 8.9 secs with an avg rate of 156 bpm. Isolated SVEs were rare (<1.0%), SVE Couplets were rare (<1.0%), and SVE Triplets were rare (<1.0%). Isolated VEs were rare  (<1.0%, 9342), VE Couplets were rare (<1.0%, 46), and VE Triplets were rare (<1.0%, 3). Ventricular Trigeminy was present.  TTE 11/13/2021 EF 55-60%, grade II DD, mild-mod dilated LA, moderate MR  EKG:  Last EKG results: today -- sinus rhythm 86 bpm, RBBB, LAFB, voltage criteria for LVH, septal infarct   Recent Labs: 11/15/2021: ALT 21; TSH 5.103 11/18/2021: BUN 21; Creatinine, Ser 1.71; Hemoglobin 9.3; Magnesium 1.7; Platelets 117; Potassium 4.0; Sodium 137    Physical Exam:    VS:  There were no vitals taken for this visit.    Wt Readings from Last 3 Encounters:  12/20/21 149 lb 6.4 oz (67.8 kg)  11/12/21 176 lb (79.8 kg)  08/08/18 145 lb 14.4 oz (66.2 kg)     GEN:  Well nourished, well developed in no acute distress CARDIAC: RRR, no murmurs, rubs, gallops RESPIRATORY:  Normal work of breathing MUSCULOSKELETAL: no edema    ASSESSMENT & PLAN:    History of second degree AV block during hospitalization for acute anemia: improved during admission with correction of underlying acute illness. Outpatient monitor did not show any second degree AV block. We discussed symptoms of slow rates -lightheadedness, fatigue, passing out. I instructed him to call us immediately or call EMS if he experiences these symptoms.        Medication Adjustments/Labs and Tests Ordered: Current medicines are reviewed at length with the patient today.  Concerns regarding medicines are outlined above.  No orders of the defined types were placed in this encounter.  No orders of the defined types were placed in this encounter.    Signed, Maurice Small, MD  12/20/2021 9:23 PM    [facility]

## 2021-12-20 NOTE — Progress Notes (Signed)
12/20/2021 Jack Fuentes [id number] [birth year]   HISTORY OF PRESENT ILLNESS: This is a 76 year old male who is new to our office.  He was recently hospitalized for iron deficiency anemia.  Was given 3 units of packed red blood cells and IV iron infusions.  Hemoglobin was in the 6 g range.  He was heme-negative there.  He denies any overt GI bleeding, but has never had a colonoscopy.  Denies any GI complaints.  Has history of large B-cell lymphoma.  Had laparotomy with lysis of adhesions and bowel resection as well as resection of large B-cell lymphoma tumor in April 2020.  Had a CT scan abdomen and pelvis without contrast on 11/13/2021 that showed status post small bowel resection with small bowel loops proximal to the anastomosis demonstrating wall thickening and mesenteric edema worrisome for nonspecific enteritis including infectious, inflammatory, ischemic etiologies.  He is not currently following with anybody for his history of lymphoma.  He is also having some issues with kidney stones and requiring some urologic procedures for that.  Past Medical History:  Diagnosis Date   Anemia 10/19/2021   Cancer ([place])    lymphoma-large B-cell   Chronic kidney disease (CKD) 2020   History of left bundle branch block (LBBB)    Hyperlipidemia    Kidney stone 11/2021   Peripheral neuropathy    SBO (small bowel obstruction) ([place]) 06/2018   Past Surgical History:  Procedure Laterality Date   CYSTOSCOPY W/ URETERAL STENT PLACEMENT Right 11/15/2021   Procedure: CYSTOSCOPY WITH RETROGRADE PYELOGRAM;  Surgeon: Vira Agar, MD;  Location: [facility];  Service: Urology;  Laterality: Right;   EYE SURGERY     IR NEPHROSTOMY PLACEMENT RIGHT  11/16/2021   IR PATIENT EVAL TECH 0-60 MINS  08/01/2018   LAPAROTOMY N/A 07/03/2018   Procedure: EXPLORATORY LAPAROTOMY, LYSIS OF ADHESION, BOWEL RESECTION, RESECTION MESTASTATIC TUMOR;  Surgeon: Jovita Kussmaul, MD;  Location: [facility];  Service: General;   Laterality: N/A;   NASAL FRACTURE SURGERY     SMALL BOWEL EMBOLIZATION  06/2018    reports that he has never smoked. He has never used smokeless tobacco. He reports that he does not currently use alcohol. He reports that he does not use drugs. family history includes Heart attack in his father; Heart disease in his father; Kidney disease in his father; Multiple myeloma in his sister. Allergies  Allergen Reactions   Levofloxacin Swelling   Midazolam Swelling      Outpatient Encounter Medications as of 12/20/2021  Medication Sig   acetaminophen (TYLENOL) 325 MG tablet Take 2 tablets (650 mg total) by mouth every 6 (six) hours. (Patient taking differently: Take 650 mg by mouth every 6 (six) hours as needed for moderate pain.)   doxycycline (VIBRA-TABS) 100 MG tablet    ferrous sulfate 325 (65 FE) MG tablet Take 1 tablet (325 mg total) by mouth every other day.   furosemide (LASIX) 40 MG tablet Take 1 tablet (40 mg total) by mouth daily.   gabapentin (NEURONTIN) 300 MG capsule Take 1 capsule (300 mg total) by mouth 2 (two) times daily.   Multiple Vitamin (MULTIVITAMIN WITH MINERALS) TABS tablet Take 1 tablet by mouth daily.   No facility-administered encounter medications on file as of 12/20/2021.     REVIEW OF SYSTEMS  : All other systems reviewed and negative except where noted in the History of Present Illness.   PHYSICAL EXAM: BP 110/76   Pulse 76   Ht '5\' 8"'  (1.727  m)   Wt 149 lb 6.4 oz (67.8 kg)   SpO2 97%   BMI 22.72 kg/m  General: Well developed white male in no acute distress Head: Normocephalic and atraumatic Eyes:  Sclerae anicteric, conjunctiva pink. Ears: Normal auditory acuity Lungs: Clear throughout to auscultation; no W/R/R. Heart: Regular rate and rhythm; no M/R/G. Abdomen: Soft, non-distended.  BS present.  Non-tender. Rectal: Will be done at the time of colonoscopy. Musculoskeletal: Symmetrical with no gross deformities  Skin: No lesions on visible  extremities Extremities: No edema  Neurological: Alert oriented x 4, grossly non-focal Psychological:  Alert and cooperative. Normal mood and affect  ASSESSMENT AND PLAN: *Iron deficiency anemia: Was given 3 units of packed red blood cells and IV iron while in the hospital fairly recently for symptomatic anemia with a hemoglobin in the 6 g range.  He denies any overt sign of GI bleeding was actually heme-negative in the hospital.  Has never had a colonoscopy in the past.  We will plan for with colonoscopy and EGD to rule out any possible source of GI bleeding including intermittently bleeding AVMs.  Anemia likely at least in part due to CKD.  We will schedule this with Dr. Loletha Carrow.  Patient elected to wait until after his urologic procedure in November to have these performed.  The risks, benefits, and alternatives to EGD and colonoscopy were discussed with the patient and he consents to proceed.   CC:  Medicine, [employer]*

## 2021-12-21 ENCOUNTER — Ambulatory Visit: Payer: Medicare Other | Attending: Cardiovascular Disease | Admitting: Cardiovascular Disease

## 2021-12-21 ENCOUNTER — Encounter: Payer: Self-pay | Admitting: Cardiovascular Disease

## 2021-12-21 VITALS — BP 118/72 | HR 85 | Ht 68.0 in | Wt 153.0 lb

## 2021-12-21 DIAGNOSIS — G620 Drug-induced polyneuropathy: Secondary | ICD-10-CM | POA: Insufficient documentation

## 2021-12-21 DIAGNOSIS — I441 Atrioventricular block, second degree: Secondary | ICD-10-CM | POA: Insufficient documentation

## 2021-12-21 DIAGNOSIS — L97521 Non-pressure chronic ulcer of other part of left foot limited to breakdown of skin: Secondary | ICD-10-CM | POA: Insufficient documentation

## 2021-12-21 NOTE — Progress Notes (Signed)
____________________________________________________________  Attending physician addendum:  Janett Billow,   Thank you for sending this case to me. I have reviewed the entire note and agree with the plan.  This patient also needs to be seen by Cardiology before the EGD/colonoscopy. 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  Last progress note from them indicates plans for Zio monitor and possibly EP evaluation.Pacemaker was not felt to be needed.  Wilfrid Lund, MD  ____________________________________________________________   [name],     I reviewed your last hospital progress note on this patient from Sept hospitalization.   I suspect our outpatient endoscopy anesthesia staff will want a completed Cardiology evaluation prior to his endoscopic procedures.   He does not seem to have an appointment with you - could you please help arrange?  - [facility]

## 2021-12-21 NOTE — Patient Instructions (Signed)
Medication Instructions:  Your physician recommends that you continue on your current medications as directed. Please refer to the Current Medication list given to you today.  *If you need a refill on your cardiac medications before your next appointment, please call your pharmacy*   Follow-Up: At [facility], you and your health needs are our priority.  As part of our continuing mission to provide you with exceptional heart care, we have created designated Provider Care Teams.  These Care Teams include your primary Cardiologist (physician) and Advanced Practice Providers (APPs -  Physician Assistants and Nurse Practitioners) who all work together to provide you with the care you need, when you need it.  Your next appointment:   1 year(s)  The format for your next appointment:   In Person  Provider:   You may see Augustus E Mealor, MD or one of the following Advanced Practice Providers on your designated Care Team:   Renee Ursuy, PA-C Michael "Andy" Tillery, PA-C    Important Information About Sugar       

## 2021-12-29 ENCOUNTER — Other Ambulatory Visit: Payer: Self-pay | Admitting: Urology

## 2021-12-29 NOTE — Progress Notes (Signed)
Sent message, via epic in basket, requesting orders in epic from surgeon.  

## 2022-01-04 NOTE — Patient Instructions (Signed)
SURGICAL WAITING ROOM VISITATION Patients having surgery or a procedure may have no more than 2 support people in the waiting area - these visitors may rotate.   Children under the age of 6 must have an adult with them who is not the patient. If the patient needs to stay at the hospital during part of their recovery, the visitor guidelines for inpatient rooms apply. Pre-op nurse will coordinate an appropriate time for 1 support person to accompany patient in pre-op.  This support person may not rotate.    Please refer to the [facility] website for the visitor guidelines for Inpatients (after your surgery is over and you are in a regular room).    Your procedure is scheduled on: 01/14/22   Report to [facility] Main Entrance    Report to admitting at 8:00 AM   Call this number if you have problems the morning of surgery [phone]   Do not eat food or liquids :After Midnight.          If you have questions, please contact your surgeon's office.   FOLLOW BOWEL PREP AND ANY ADDITIONAL PRE OP INSTRUCTIONS YOU RECEIVED FROM YOUR SURGEON'S OFFICE!!!     Oral Hygiene is also important to reduce your risk of infection.                                    Remember - BRUSH YOUR TEETH THE MORNING OF SURGERY WITH YOUR REGULAR TOOTHPASTE   Do NOT smoke after Midnight   Take these medicines the morning of surgery with A SIP OF WATER: Tylenol   DO NOT TAKE ANY ORAL DIABETIC MEDICATIONS DAY OF YOUR SURGERY  Bring CPAP mask and tubing day of surgery.                              You may not have any metal on your body including jewelry, and body piercing             Do not wear lotions, powders, cologne, or deodorant              Men may shave face and neck.   Do not bring valuables to the hospital. [place].   Contacts, dentures or bridgework may not be worn into surgery.  DO NOT [place]. PHARMACY WILL DISPENSE MEDICATIONS LISTED ON YOUR MEDICATION LIST TO YOU DURING YOUR ADMISSION [place]!    Patients discharged on the day of surgery will not be allowed to drive home.  Someone NEEDS to stay with you for the first 24 hours after anesthesia.   Special Instructions: Bring a copy of your healthcare power of attorney and living will documents the day of surgery if you haven't scanned them before.              Please read over the following fact sheets you were given: IF [place] [phone] Schneiders   If you received a COVID test during your pre-op visit  it is requested that you wear a mask when out in public, stay away from anyone that may not be feeling well and notify your surgeon if you develop symptoms. If you  test positive for Covid or have been in contact with anyone that has tested positive in the last 10 days please notify you surgeon.     [facility] - Preparing for Surgery Before surgery, you can play an important role.  Because skin is not sterile, your skin needs to be as free of germs as possible.  You can reduce the number of germs on your skin by washing with CHG (chlorahexidine gluconate) soap before surgery.  CHG is an antiseptic cleaner which kills germs and bonds with the skin to continue killing germs even after washing. Please DO NOT use if you have an allergy to CHG or antibacterial soaps.  If your skin becomes reddened/irritated stop using the CHG and inform your nurse when you arrive at Short Stay. Do not shave (including legs and underarms) for at least 48 hours prior to the first CHG shower.  You may shave your face/neck.  Please follow these instructions carefully:  1.  Shower with CHG Soap the night before surgery and the  morning of surgery.  2.  If you choose to wash your hair, wash your hair first as usual with your normal  shampoo.  3.  After you shampoo, rinse your hair and body  thoroughly to remove the shampoo.                             4.  Use CHG as you would any other liquid soap.  You can apply chg directly to the skin and wash.  Gently with a scrungie or clean washcloth.  5.  Apply the CHG Soap to your body ONLY FROM THE NECK DOWN.   Do   not use on face/ open                           Wound or open sores. Avoid contact with eyes, ears mouth and   genitals (private parts).                       Wash face,  Genitals (private parts) with your normal soap.             6.  Wash thoroughly, paying special attention to the area where your    surgery  will be performed.  7.  Thoroughly rinse your body with warm water from the neck down.  8.  DO NOT shower/wash with your normal soap after using and rinsing off the CHG Soap.                9.  Pat yourself dry with a clean towel.            10.  Wear clean pajamas.            11.  Place clean sheets on your bed the night of your first shower and do not  sleep with pets. Day of Surgery : Do not apply any lotions/deodorants the morning of surgery.  Please wear clean clothes to the hospital/surgery center.  FAILURE TO FOLLOW THESE INSTRUCTIONS MAY RESULT IN THE CANCELLATION OF YOUR SURGERY  PATIENT SIGNATURE_________________________________  NURSE SIGNATURE__________________________________  ________________________________________________________________________

## 2022-01-04 NOTE — Progress Notes (Signed)
[name],      I reviewed your last hospital progress note on this patient from Sept hospitalization.   I suspect our outpatient endoscopy anesthesia staff will want a completed Cardiology evaluation prior to his endoscopic procedures.   He does not seem to have an appointment with you - could you please help arrange?   - Herma Ard [facility]

## 2022-01-04 NOTE — Progress Notes (Signed)
Request has been faxed and sent to Dr Blenda Mounts in basket.

## 2022-01-04 NOTE — Progress Notes (Signed)
COVID Vaccine Completed: yes  Date of COVID positive in last 90 days:  PCP - [facility] Cardiologist - Gwyndolyn Kaufman, MD Electrophysiologist- Doralee Albino, MD  Chest x-ray - CT 11/16/21 Epic EKG - 12/21/21 Epic Stress Test -  ECHO - 11/13/21 Epic Cardiac Cath -  Pacemaker/ICD device last checked: Spinal Cord Stimulator:  Bowel Prep -   Sleep Study -  CPAP -   Fasting Blood Sugar -  Checks Blood Sugar _____ times a day  Blood Thinner Instructions: Aspirin Instructions: Last Dose:  Activity level:  Can go up a flight of stairs and perform activities of daily living without stopping and without symptoms of chest pain or shortness of breath.  Able to exercise without symptoms  Unable to go up a flight of stairs without symptoms of     Anesthesia review: AV block, LBBB, anemia, SOB and anemia in Sept  Patient denies shortness of breath, fever, cough and chest pain at PAT appointment  Patient verbalized understanding of instructions that were given to them at the PAT appointment. Patient was also instructed that they will need to review over the PAT instructions again at home before surgery.

## 2022-01-05 ENCOUNTER — Encounter (HOSPITAL_COMMUNITY)
Admission: RE | Admit: 2022-01-05 | Discharge: 2022-01-05 | Disposition: A | Discharge status: Home / Self Care | Payer: Medicare Other | Source: Ambulatory Visit | Attending: Anesthesiology | Admitting: Anesthesiology

## 2022-01-05 ENCOUNTER — Telehealth (HOSPITAL_BASED_OUTPATIENT_CLINIC_OR_DEPARTMENT_OTHER): Payer: Self-pay | Admitting: Family

## 2022-01-05 DIAGNOSIS — R768 Other specified abnormal immunological findings in serum: Secondary | ICD-10-CM

## 2022-01-05 DIAGNOSIS — I441 Atrioventricular block, second degree: Secondary | ICD-10-CM

## 2022-01-05 NOTE — Telephone Encounter (Signed)
   Name: Jack Fuentes  DOB: [birth year]  MRN: [id number]  Primary Cardiologist: Freada Bergeron, MD  Chart reviewed as part of pre-operative protocol coverage. Because of Creedence Kunesh [facility] past medical history and time since last visit, he will require a follow-up telephone visit in order to better assess preoperative cardiovascular risk.  Pre-op covering staff: - Please schedule appointment and call patient to inform them. If patient already had an upcoming appointment within acceptable timeframe, please add "pre-op clearance" to the appointment notes so provider is aware. - Please contact requesting surgeon's office via preferred method (i.e, phone, fax) to inform them of need for appointment prior to surgery.  Plavix/aspirin already discontinued during recent admission.  Elgie Collard, PA-C  01/05/2022, 4:06 PM

## 2022-01-05 NOTE — Progress Notes (Signed)
Patient did not show up for his 1100 PAT appointment. Called patient multiple times with no answer. Left a voicemail with instructions to call back. PAT scheduler and charge RN notified.

## 2022-01-05 NOTE — Telephone Encounter (Signed)
   Pre-operative Risk Assessment    Patient Name: Jack Fuentes  DOB: October 01, 1945 MRN: [id number]      Request for Surgical Clearance    Procedure:   EGD/colonoscopy  Date of Surgery:  Clearance TBD                                 Surgeon:  Dr. Loletha Carrow Surgeon's Group or Practice Name:  [facility] Phone number:  [phone] Fax number:  [phone]   Type of Clearance Requested:   - Medical  (Plavix/Aspirin already discontinued during recent admission)   Type of Anesthesia:  Not Indicated   Additional requests/questions:   Received fax from Dr. Loletha Carrow "[name] - I reviewed your last hospital progress note on this patient from September hospitalization. I suspect our outpatient endoscopy anesthesia staff will want a completed cardiology evaluation prior to endoscopyic procedures. He does not seem to have appointment with you - could you please help arrange". Noted that patient is patient of [name]. Mealor - not [name]. Will route to preop team for assistance.Please fax a copy of clearance to the surgeon's office.   Signed, [name]   01/05/2022, 9:19 AM

## 2022-01-06 ENCOUNTER — Telehealth: Payer: Self-pay

## 2022-01-06 NOTE — Telephone Encounter (Signed)
Pt scheduled for tele visit on 01/11/22 for preop clearance.

## 2022-01-06 NOTE — Telephone Encounter (Signed)
  Patient Consent for Virtual Visit         Jack Fuentes has provided verbal consent on 01/06/2022 for a virtual visit (video or telephone).   CONSENT FOR VIRTUAL VISIT FOR:  Jack Fuentes  By participating in this virtual visit I agree to the following:  I hereby voluntarily request, consent and authorize [facility] and its employed or contracted physicians, physician assistants, nurse practitioners or other licensed health care professionals (the Practitioner), to provide me with telemedicine health care services (the "Services") as deemed necessary by the treating Practitioner. I acknowledge and consent to receive the Services by the Practitioner via telemedicine. I understand that the telemedicine visit will involve communicating with the Practitioner through live audiovisual communication technology and the disclosure of certain medical information by electronic transmission. I acknowledge that I have been given the opportunity to request an in-person assessment or other available alternative prior to the telemedicine visit and am voluntarily participating in the telemedicine visit.  I understand that I have the right to withhold or withdraw my consent to the use of telemedicine in the course of my care at any time, without affecting my right to future care or treatment, and that the Practitioner or I may terminate the telemedicine visit at any time. I understand that I have the right to inspect all information obtained and/or recorded in the course of the telemedicine visit and may receive copies of available information for a reasonable fee.  I understand that some of the potential risks of receiving the Services via telemedicine include:  Delay or interruption in medical evaluation due to technological equipment failure or disruption; Information transmitted may not be sufficient (e.g. poor resolution of images) to allow for appropriate medical decision making by the  Practitioner; and/or  In rare instances, security protocols could fail, causing a breach of personal health information.  Furthermore, I acknowledge that it is my responsibility to provide information about my medical history, conditions and care that is complete and accurate to the best of my ability. I acknowledge that Practitioner's advice, recommendations, and/or decision may be based on factors not within their control, such as incomplete or inaccurate data provided by me or distortions of diagnostic images or specimens that may result from electronic transmissions. I understand that the practice of medicine is not an exact science and that Practitioner makes no warranties or guarantees regarding treatment outcomes. I acknowledge that a copy of this consent can be made available to me via my patient portal ([facility]), or I can request a printed copy by calling the office of [facility].    I understand that my insurance will be billed for this visit.   I have read or had this consent read to me. I understand the contents of this consent, which adequately explains the benefits and risks of the Services being provided via telemedicine.  I have been provided ample opportunity to ask questions regarding this consent and the Services and have had my questions answered to my satisfaction. I give my informed consent for the services to be provided through the use of telemedicine in my medical care

## 2022-01-07 ENCOUNTER — Other Ambulatory Visit: Payer: Self-pay

## 2022-01-07 ENCOUNTER — Encounter (HOSPITAL_COMMUNITY)
Admission: RE | Admit: 2022-01-07 | Discharge: 2022-01-07 | Disposition: A | Discharge status: Home / Self Care | Payer: Medicare Other | Source: Ambulatory Visit | Attending: Urology | Admitting: Urology

## 2022-01-07 ENCOUNTER — Encounter (HOSPITAL_COMMUNITY): Payer: Self-pay

## 2022-01-07 DIAGNOSIS — I441 Atrioventricular block, second degree: Secondary | ICD-10-CM | POA: Diagnosis not present

## 2022-01-07 DIAGNOSIS — R768 Other specified abnormal immunological findings in serum: Secondary | ICD-10-CM | POA: Insufficient documentation

## 2022-01-07 DIAGNOSIS — Z01818 Encounter for other preprocedural examination: Secondary | ICD-10-CM | POA: Diagnosis present

## 2022-01-07 HISTORY — DX: Sleep apnea, unspecified: G47.30

## 2022-01-07 HISTORY — DX: Personal history of urinary calculi: Z87.442

## 2022-01-07 LAB — COMPREHENSIVE METABOLIC PANEL
ALT: 29 U/L (ref 0–44)
AST: 31 U/L (ref 15–41)
Albumin: 3.2 g/dL — ABNORMAL LOW (ref 3.5–5.0)
Alkaline Phosphatase: 67 U/L (ref 38–126)
Anion gap: 9 (ref 5–15)
BUN: 23 mg/dL (ref 8–23)
CO2: 22 mmol/L (ref 22–32)
Calcium: 8.5 mg/dL — ABNORMAL LOW (ref 8.9–10.3)
Chloride: 104 mmol/L (ref 98–111)
Creatinine, Ser: 1.5 mg/dL — ABNORMAL HIGH (ref 0.61–1.24)
GFR, Estimated: 49 mL/min — ABNORMAL LOW (ref >60)
Glucose, Bld: 101 mg/dL — ABNORMAL HIGH (ref 70–99)
Potassium: 3.7 mmol/L (ref 3.5–5.1)
Sodium: 135 mmol/L (ref 135–145)
Total Bilirubin: 0.6 mg/dL (ref 0.3–1.2)
Total Protein: 7 g/dL (ref 6.5–8.1)

## 2022-01-07 LAB — CBC
HCT: 39.3 % (ref 39.0–52.0)
Hemoglobin: 12.6 g/dL — ABNORMAL LOW (ref 13.0–17.0)
MCH: 29.4 pg (ref 26.0–34.0)
MCHC: 32.1 g/dL (ref 30.0–36.0)
MCV: 91.8 fL (ref 80.0–100.0)
Platelets: 149 10*3/uL — ABNORMAL LOW (ref 150–400)
RBC: 4.28 MIL/uL (ref 4.22–5.81)
RDW: 21.9 % — ABNORMAL HIGH (ref 11.5–15.5)
WBC: 10.3 10*3/uL (ref 4.0–10.5)
nRBC: 0 % (ref 0.0–0.2)

## 2022-01-07 NOTE — Patient Instructions (Addendum)
SURGICAL WAITING ROOM VISITATION Patients having surgery or a procedure may have no more than 2 support people in the waiting area - these visitors may rotate.   Children under the age of 34 must have an adult with them who is not the patient. If the patient needs to stay at the hospital during part of their recovery, the visitor guidelines for inpatient rooms apply. Pre-op nurse will coordinate an appropriate time for 1 support person to accompany patient in pre-op.  This support person may not rotate.    Please refer to the [facility] website for the visitor guidelines for Inpatients (after your surgery is over and you are in a regular room).    Your procedure is scheduled on: 01/14/22   Report to [facility] Main Entrance    Report to admitting at     [place] AM   Call this number if you have problems the morning of surgery [phone]   Do not eat food or liquids :After Midnight.          If you have questions, please contact your surgeon's office.   FOLLOW  ANY ADDITIONAL PRE OP INSTRUCTIONS YOU RECEIVED FROM YOUR SURGEON'S OFFICE!!!     Oral Hygiene is also important to reduce your risk of infection.                                    Remember - BRUSH YOUR TEETH THE MORNING OF SURGERY WITH YOUR REGULAR TOOTHPASTE   Do NOT smoke after Midnight   Take these medicines the morning of surgery with A SIP OF WATER: Tylenol if needed  DO NOT TAKE ANY ORAL DIABETIC MEDICATIONS DAY OF YOUR SURGERY  Bring CPAP mask and tubing day of surgery.                              You may not have any metal on your body including jewelry, and body piercing             Do not wear lotions, powders, cologne, or deodorant              Men may shave face and neck.   Do not bring valuables to the hospital. [address].   Contacts, dentures or bridgework may not be worn into surgery.  DO NOT [place]. PHARMACY WILL DISPENSE MEDICATIONS LISTED ON YOUR MEDICATION LIST TO YOU DURING YOUR ADMISSION [place]!    Patients discharged on the day of surgery will not be allowed to drive home.  Someone NEEDS to stay with you for the first 24 hours after anesthesia.   Special Instructions: Bring a copy of your healthcare power of attorney and living will documents the day of surgery if you haven't scanned them before.              Please read over the following fact sheets you were given: IF [place] [phone] Schneiders   If you received a COVID test during your pre-op visit  it is requested that you wear a mask when out in public, stay away from anyone that may not be feeling well and notify your surgeon if you develop  symptoms. If you test positive for Covid or have been in contact with anyone that has tested positive in the last 10 days please notify you surgeon.     [facility] - Preparing for Surgery Before surgery, you can play an important role.  Because skin is not sterile, your skin needs to be as free of germs as possible.  You can reduce the number of germs on your skin by washing with CHG (chlorahexidine gluconate) soap before surgery.  CHG is an antiseptic cleaner which kills germs and bonds with the skin to continue killing germs even after washing. Please DO NOT use if you have an allergy to CHG or antibacterial soaps.  If your skin becomes reddened/irritated stop using the CHG and inform your nurse when you arrive at Short Stay. Do not shave (including legs and underarms) for at least 48 hours prior to the first CHG shower.  You may shave your face/neck.  Please follow these instructions carefully:  1.  Shower with CHG Soap the night before surgery and the  morning of surgery.  2.  If you choose to wash your hair, wash your hair first as usual with your normal  shampoo.  3.  After you shampoo, rinse your hair and body  thoroughly to remove the shampoo.                             4.  Use CHG as you would any other liquid soap.  You can apply chg directly to the skin and wash.  Gently with a scrungie or clean washcloth.  5.  Apply the CHG Soap to your body ONLY FROM THE NECK DOWN.   Do not use on face/ open                           Wound or open sores. Avoid contact with eyes, ears mouth and genitals (private parts).                       Wash face,  Genitals (private parts) with your normal soap.             6.  Wash thoroughly, paying special attention to the area where your  surgery  will be performed.  7.  Thoroughly rinse your body with warm water from the neck down.  8.  DO NOT shower/wash with your normal soap after using and rinsing off the CHG Soap.                9.  Pat yourself dry with a clean towel.            10.  Wear clean pajamas.            11.  Place clean sheets on your bed the night of your first shower and do not  sleep with pets. Day of Surgery : Do not apply any lotions/deodorants the morning of surgery.  Please wear clean clothes to the hospital/surgery center.  FAILURE TO FOLLOW THESE INSTRUCTIONS MAY RESULT IN THE CANCELLATION OF YOUR SURGERY  PATIENT SIGNATURE_________________________________  NURSE SIGNATURE__________________________________  ________________________________________________________________________

## 2022-01-07 NOTE — Progress Notes (Addendum)
COVID Vaccine Completed: yes  Date of COVID positive in last 90 days:  PCP - [place] FNP Cardiologist - Gwyndolyn Kaufman, MD  has tele visit 01-11-22 for preop clearance Electrophysiologist- Doralee Albino, MD  Chest x-ray - CT 11/16/21 Epic EKG - 12/21/21 Epic Stress Test -  ECHO - 11/13/21 Epic Cardiac Cath -  Pacemaker/ICD device last checked: Spinal Cord Stimulator:  Bowel Prep -   Sleep Study -  CPAP -   Fasting Blood Sugar -  Checks Blood Sugar _____ times a day  Blood Thinner Instructions: Aspirin Instructions: Last Dose:  Activity level:  Can go up a flight of stairs and perform activities of daily living without stopping and without symptoms of chest pain or shortness of breath.   Anesthesia review: AV block, LBBB, anemia, SOB and anemia in Sept  Patient denies shortness of breath, fever, cough and chest pain at PAT appointment  Patient verbalized understanding of instructions that were given to them at the PAT appointment. Patient was also instructed that they will need to review over the PAT instructions again at home before surgery.

## 2022-01-11 ENCOUNTER — Ambulatory Visit: Payer: Medicare Other | Attending: Internal Medicine | Admitting: Physician Assistant

## 2022-01-11 DIAGNOSIS — Z01810 Encounter for preprocedural cardiovascular examination: Secondary | ICD-10-CM

## 2022-01-11 NOTE — Progress Notes (Signed)
Virtual Visit via Telephone Note   Because of ESCHER HARR co-morbid illnesses, he is at least at moderate risk for complications without adequate follow up.  This format is felt to be most appropriate for this patient at this time.  The patient did not have access to video technology/had technical difficulties with video requiring transitioning to audio format only (telephone).  All issues noted in this document were discussed and addressed.  No physical exam could be performed with this format.  Please refer to the patient's chart for his consent to telehealth for [facility].  Evaluation Performed:  Preoperative cardiovascular risk assessment _____________   Date:  01/11/2022   Patient ID:  Jack Fuentes, Jack Fuentes November 13, 1945, MRN [id number] Patient Location:  Home Provider location:   Office  Primary Care Provider:  [facility] [place] Primary Cardiologist:  Freada Bergeron, MD  Chief Complaint / Patient Profile   76 y.o. y/o male with a h/o iron deficiency anemia, AKI on stage IV CKD, diffuse large B-cell lymphoma, Mobitz 2 AV block who is pending EGD/colonoscopy and presents today for telephonic preoperative cardiovascular risk assessment.  Past Medical History    Past Medical History:  Diagnosis Date   Anemia 10/19/2021   Cancer ([place])    lymphoma-large B-cell   Chronic kidney disease (CKD) 2020   History of kidney stones    History of left bundle branch block (LBBB)    Hyperlipidemia    Kidney stone 11/2021   Peripheral neuropathy    SBO (small bowel obstruction) ([place]) 06/2018   Sleep apnea    no longer uses cpap lost 50lbs   Past Surgical History:  Procedure Laterality Date   CYSTOSCOPY W/ URETERAL STENT PLACEMENT Right 11/15/2021   Procedure: CYSTOSCOPY WITH RETROGRADE PYELOGRAM;  Surgeon: Vira Agar, MD;  Location: [place];  Service: Urology;  Laterality: Right;   EYE SURGERY     muscle imbalance   IR NEPHROSTOMY  PLACEMENT RIGHT  11/16/2021   IR PATIENT EVAL TECH 0-60 MINS  08/01/2018   LAPAROTOMY N/A 07/03/2018   Procedure: EXPLORATORY LAPAROTOMY, LYSIS OF ADHESION, BOWEL RESECTION, RESECTION MESTASTATIC TUMOR;  Surgeon: Jovita Kussmaul, MD;  Location: [place];  Service: General;  Laterality: N/A;   NASAL FRACTURE SURGERY     SMALL BOWEL EMBOLIZATION  06/2018    Allergies  Allergies  Allergen Reactions   Levofloxacin Swelling   Midazolam Swelling    History of Present Illness    Jack Fuentes is a 76 y.o. male who presents via audio/video conferencing for a telehealth visit today.  Pt was last seen in cardiology clinic on 12/21/21 by Dr. Myles Gip.  At that time Jack Fuentes was doing well .  The patient is now pending procedure as outlined above. Since his last visit, he remains stable from a cardiac standpoint. He has not had any issues with his heart to his knowledge. He denies palpitations, shortness of breath, and chest pain. He has no issues walking around the big stores like [employer]. He can mow his own lawn and rides his bike about a mile or so. For this reason he has scored a 5.62 on the DASI. This exceed the 4 mets minimum requirement. He also shares with me that on Friday he is having some kidney stones removed and wonders if there is any hospital sponsored housing that we offer. I told him I could ask our social worker, Kennyth Lose to see if she knows of anything.  He is not on any medications that would require holding at this time.  Home Medications    Prior to Admission medications   Medication Sig Start Date End Date Taking? Authorizing Provider  acetaminophen (TYLENOL) 325 MG tablet Take 2 tablets (650 mg total) by mouth every 6 (six) hours. Patient taking differently: Take 650 mg by mouth every 6 (six) hours as needed for moderate pain. 07/13/18   Isabelle Course, MD  ferrous sulfate 325 (65 FE) MG tablet Take 1 tablet (325 mg total) by mouth every other day. Patient taking  differently: Take 325 mg by mouth every 3 (three) days. 11/19/21 07/17/22  Leigh Aurora, DO  furosemide (LASIX) 40 MG tablet Take 1 tablet (40 mg total) by mouth daily. 11/18/21 11/18/22  Leigh Aurora, DO  gabapentin (NEURONTIN) 300 MG capsule Take 1 capsule (300 mg total) by mouth 2 (two) times daily. Patient taking differently: Take 300 mg by mouth at bedtime. 11/18/21 05/17/22  Leigh Aurora, DO  Multiple Vitamin (MULTIVITAMIN WITH MINERALS) TABS tablet Take 1 tablet by mouth daily. 07/14/18   Isabelle Course, MD  Na Sulfate-K Sulfate-Mg Sulf 17.5-3.13-1.6 GM/177ML SOLN  12/20/21   [provider]    Physical Exam    Vital Signs:  Jack Fuentes does not have vital signs available for review today.  Given telephonic nature of communication, physical exam is limited. AAOx3. NAD. Normal affect.  Speech and respirations are unlabored.  Accessory Clinical Findings    None  Assessment & Plan    1.  Preoperative Cardiovascular Risk Assessment: EGD/colonoscopy, Dr. Loletha Carrow, [facility]  Jack Fuentes perioperative risk of a major cardiac event is 0.4% according to the Revised Cardiac Risk Index (RCRI).  Therefore, he is at low risk for perioperative complications.   His functional capacity is good at 5.62 METs according to the [facility] Activity Status Index (DASI). Recommendations: According to ACC/AHA guidelines, no further cardiovascular testing needed.  The patient may proceed to surgery at acceptable risk.    Primary Cardiologist: Freada Bergeron, MD  Chart reviewed as part of pre-operative protocol coverage. Given past medical history and time since last visit, based on ACC/AHA guidelines, Jack Fuentes would be at acceptable risk for the planned procedure without further cardiovascular testing.   Patient was advised that if he develops new symptoms prior to surgery to contact our office to arrange a follow-up appointment.  He verbalized understanding.  Plavix/aspirin already  discontinued during recent admission.   I will route this recommendation to the requesting party via Epic fax function and remove from pre-op pool.  Please call with questions.  Time:   Today, I have spent 10 minutes with the patient with telehealth technology discussing medical history, symptoms, and management plan.     Elgie Collard, PA-C   01/11/2022, 7:40 AM

## 2022-01-12 ENCOUNTER — Telehealth (HOSPITAL_COMMUNITY): Payer: Self-pay | Admitting: Licensed Clinical Social Worker

## 2022-01-12 NOTE — Telephone Encounter (Signed)
Patient asked about a hospital sponsored hotel where he could go post his kidney stone procedure. Patient states he is "all alone" and has no one to assist him with transportation. CSW explained that there is a possibility that he may need to have a responsible person with him prior to having the procedure and that it could be more complicated than just transportation home post procedure. Patient states he plans to call senior resources to possibly hire someone to bring him and stay with him at home post procedure. CSW encouraged patient to contact the office of the MD who is doing the kidney stone procedure to have a conversation on expectations for day of surgery. Patient verbalizes understanding and will follow up with MD office. CSW available as needed. Raquel Sarna, [place], [place]

## 2022-01-12 NOTE — Progress Notes (Signed)
Anesthesia Chart Review   Case: [id number] Date/Time: 01/14/22 0945   Procedure: CYSTOSCOPY/ RETROGRADE/URETEROSCOPY/HOLMIUM LASER/STENT PLACEMENT (Right)   Anesthesia type: General   Pre-op diagnosis: RIGHT URETERAL STONE   Location: [facility] PROCEDURE [facility] / [facility]   Surgeons: Jack Lima, MD       DISCUSSION:76 y.o. never smoker with h/o Stage IV CKD, sleep apnea, LBBB, Mobitz 2 AV block, diffuse large B-cell lymphoma, right ureteral stone scheduled for above procedure 01/14/2022 with Dr. Rexene Alberts.   Pt seen by cardiology 01/11/2022. Per OV note, "Mr. Jack Fuentes's perioperative risk of a major cardiac event is 0.4% according to the Revised Cardiac Risk Index (RCRI).  Therefore, he is at low risk for perioperative complications.   His functional capacity is good at 5.62 METs according to the Duke Activity Status Index (DASI). Recommendations: According to ACC/AHA guidelines, no further cardiovascular testing needed.  The patient may proceed to surgery at acceptable risk.     Primary Cardiologist: Jack Bergeron, MD   Chart reviewed as part of pre-operative protocol coverage. Given past medical history and time since last visit, based on ACC/AHA guidelines, DOMONIK Fuentes would be at acceptable risk for the planned procedure without further cardiovascular testing.    Patient was advised that if he develops new symptoms prior to surgery to contact our office to arrange a follow-up appointment.  He verbalized understanding.   Plavix/aspirin already discontinued during recent admission."  Anticipate pt can proceed with planned procedure barring acute status change.   VS: Ht '5\' 8"'$  (1.727 m)   Wt 67.1 kg   BMI 22.50 kg/m   PROVIDERS: [facility], [place]  Primary Cardiologist:  Jack Bergeron, MD  LABS: Labs reviewed: Acceptable for surgery. (all labs ordered are listed, but only abnormal results are displayed)  Labs Reviewed  CBC - Abnormal; Notable  for the following components:      Result Value   Hemoglobin 12.6 (*)    RDW 21.9 (*)    Platelets 149 (*)    All other components within normal limits  COMPREHENSIVE METABOLIC PANEL - Abnormal; Notable for the following components:   Glucose, Bld 101 (*)    Creatinine, Ser 1.50 (*)    Calcium 8.5 (*)    Albumin 3.2 (*)    GFR, Estimated 49 (*)    All other components within normal limits     IMAGES:   EKG:   CV: Echo 11/13/2021 1. Left ventricular ejection fraction, by estimation, is 55 to 60%. The  left ventricle has normal function. The left ventricle has no regional  wall motion abnormalities. Left ventricular diastolic parameters are  consistent with Grade II diastolic  dysfunction (pseudonormalization). Elevated left atrial pressure.   2. Right ventricular systolic function is normal. The right ventricular  size is normal. There is moderately elevated pulmonary artery systolic  pressure.   3. Left atrial size was mild to moderately dilated.   4. The mitral valve is abnormal. Moderate mitral valve regurgitation. No  evidence of mitral stenosis.   5. The tricuspid valve is abnormal. Tricuspid valve regurgitation is mild  to moderate.   6. The aortic valve was not well visualized. There is mild calcification  of the aortic valve. There is mild thickening of the aortic valve. Aortic  valve regurgitation is mild to moderate. No aortic stenosis is present.   7. The inferior vena cava is dilated in size with >50% respiratory  variability, suggesting right atrial pressure of 8 mmHg.  Past Medical History:  Diagnosis Date   Anemia 10/19/2021   Cancer ([place])    lymphoma-large B-cell   Chronic kidney disease (CKD) 2020   History of kidney stones    History of left bundle branch block (LBBB)    Hyperlipidemia    Kidney stone 11/2021   Peripheral neuropathy    SBO (small bowel obstruction) ([place]) 06/2018   Sleep apnea    no longer uses cpap lost 50lbs    Past Surgical  History:  Procedure Laterality Date   CYSTOSCOPY W/ URETERAL STENT PLACEMENT Right 11/15/2021   Procedure: CYSTOSCOPY WITH RETROGRADE PYELOGRAM;  Surgeon: Jack Agar, MD;  Location: [place];  Service: Urology;  Laterality: Right;   EYE SURGERY     muscle imbalance   IR NEPHROSTOMY PLACEMENT RIGHT  11/16/2021   IR PATIENT EVAL TECH 0-60 MINS  08/01/2018   LAPAROTOMY N/A 07/03/2018   Procedure: EXPLORATORY LAPAROTOMY, LYSIS OF ADHESION, BOWEL RESECTION, RESECTION MESTASTATIC TUMOR;  Surgeon: Jack Kussmaul, MD;  Location: [place];  Service: General;  Laterality: N/A;   NASAL FRACTURE SURGERY     SMALL BOWEL EMBOLIZATION  06/2018    MEDICATIONS:  acetaminophen (TYLENOL) 325 MG tablet   ferrous sulfate 325 (65 FE) MG tablet   furosemide (LASIX) 40 MG tablet   gabapentin (NEURONTIN) 300 MG capsule   Multiple Vitamin (MULTIVITAMIN WITH MINERALS) TABS tablet   Na Sulfate-K Sulfate-Mg Sulf 17.5-3.13-1.6 GM/177ML SOLN   No current facility-administered medications for this encounter.    Jack Felix Ward, PA-C [facility] [phone]

## 2022-01-12 NOTE — Anesthesia Preprocedure Evaluation (Addendum)
Anesthesia Evaluation  Patient identified by MRN, date of birth, ID band Patient awake    Reviewed: Allergy & Precautions, NPO status , Patient's Chart, lab work & pertinent test results  History of Anesthesia Complications Negative for: history of anesthetic complications  Airway Mallampati: II  TM Distance: >3 FB Neck ROM: Full    Dental no notable dental hx. (+) Implants, Dental Advisory Given   Pulmonary neg pulmonary ROS   Pulmonary exam normal        Cardiovascular Normal cardiovascular exam+ dysrhythmias (LBBB)      Neuro/Psych  Neuromuscular disease    GI/Hepatic negative GI ROS, Neg liver ROS,,,  Endo/Other  negative endocrine ROS    Renal/GU Renal InsufficiencyRenal disease (AKI)     Musculoskeletal negative musculoskeletal ROS (+)    Abdominal   Peds  Hematology  (+) Blood dyscrasia (Plavix), anemia Lymphoma    Anesthesia Other Findings Day of surgery medications reviewed with the patient.  Reproductive/Obstetrics                             Anesthesia Physical Anesthesia Plan  ASA: 2  Anesthesia Plan: General   Post-op Pain Management: Tylenol PO (pre-op)*   Induction: Intravenous, Rapid sequence and Cricoid pressure planned  PONV Risk Score and Plan: 2 and Dexamethasone and Ondansetron  Airway Management Planned: LMA  Additional Equipment:   Intra-op Plan:   Post-operative Plan: Extubation in OR  Informed Consent: I have reviewed the patients History and Physical, chart, labs and discussed the procedure including the risks, benefits and alternatives for the proposed anesthesia with the patient or authorized representative who has indicated his/her understanding and acceptance.     Dental advisory given  Plan Discussed with: Anesthesiologist and CRNA  Anesthesia Plan Comments:         Anesthesia Quick Evaluation

## 2022-01-14 ENCOUNTER — Ambulatory Visit (HOSPITAL_COMMUNITY): Payer: Medicare Other | Admitting: Physician Assistant

## 2022-01-14 ENCOUNTER — Ambulatory Visit (HOSPITAL_COMMUNITY): Payer: Medicare Other

## 2022-01-14 ENCOUNTER — Encounter (HOSPITAL_COMMUNITY)
Admission: RE | Disposition: A | Discharge status: Home / Self Care | Payer: Self-pay | Source: Home / Self Care | Attending: Urology

## 2022-01-14 ENCOUNTER — Ambulatory Visit (HOSPITAL_COMMUNITY)
Admission: RE | Admit: 2022-01-14 | Discharge: 2022-01-14 | Disposition: A | Discharge status: Home / Self Care | Payer: Medicare Other | Attending: Urology | Admitting: Urology

## 2022-01-14 ENCOUNTER — Ambulatory Visit (HOSPITAL_BASED_OUTPATIENT_CLINIC_OR_DEPARTMENT_OTHER): Payer: Medicare Other | Admitting: Certified Registered Nurse Anesthetist

## 2022-01-14 ENCOUNTER — Encounter (HOSPITAL_COMMUNITY): Payer: Self-pay | Admitting: Urology

## 2022-01-14 DIAGNOSIS — Z8572 Personal history of non-Hodgkin lymphomas: Secondary | ICD-10-CM | POA: Diagnosis not present

## 2022-01-14 DIAGNOSIS — N132 Hydronephrosis with renal and ureteral calculous obstruction: Secondary | ICD-10-CM | POA: Insufficient documentation

## 2022-01-14 DIAGNOSIS — Z7902 Long term (current) use of antithrombotics/antiplatelets: Secondary | ICD-10-CM | POA: Diagnosis not present

## 2022-01-14 DIAGNOSIS — I447 Left bundle-branch block, unspecified: Secondary | ICD-10-CM | POA: Diagnosis not present

## 2022-01-14 DIAGNOSIS — N201 Calculus of ureter: Secondary | ICD-10-CM

## 2022-01-14 HISTORY — PX: CYSTOSCOPY/URETEROSCOPY/HOLMIUM LASER/STENT PLACEMENT: SHX6546

## 2022-01-14 SURGERY — CYSTOSCOPY/URETEROSCOPY/HOLMIUM LASER/STENT PLACEMENT
Anesthesia: General | Laterality: Right

## 2022-01-14 MED ORDER — ONDANSETRON HCL 4 MG/2ML IJ SOLN
INTRAMUSCULAR | Status: DC | PRN
  Administered 2022-01-14: 4 mg via INTRAVENOUS

## 2022-01-14 MED ORDER — EPHEDRINE SULFATE-NACL 50-0.9 MG/10ML-% IV SOSY
PREFILLED_SYRINGE | INTRAVENOUS | Status: DC | PRN
  Administered 2022-01-14 (×4): 7.5 mg via INTRAVENOUS

## 2022-01-14 MED ORDER — LIDOCAINE 2% (20 MG/ML) 5 ML SYRINGE
INTRAMUSCULAR | Status: DC | PRN
  Administered 2022-01-14: 100 mg via INTRAVENOUS

## 2022-01-14 MED ORDER — PROMETHAZINE HCL 25 MG/ML IJ SOLN: 6.2500 mg | INTRAMUSCULAR | Status: DC | PRN

## 2022-01-14 MED ORDER — 0.9 % SODIUM CHLORIDE (POUR BTL) OPTIME
TOPICAL | Status: DC | PRN
  Administered 2022-01-14: 1000 mL

## 2022-01-14 MED ORDER — CHLORHEXIDINE GLUCONATE 0.12 % MT SOLN
15.0000 mL | Freq: Once | OROMUCOSAL | Status: AC
  Administered 2022-01-14: 15 mL via OROMUCOSAL

## 2022-01-14 MED ORDER — OXYCODONE-ACETAMINOPHEN 5-325 MG PO TABS: 1.0000 | ORAL_TABLET | ORAL | 0 refills | Status: AC | PRN

## 2022-01-14 MED ORDER — DEXAMETHASONE SODIUM PHOSPHATE 10 MG/ML IJ SOLN
INTRAMUSCULAR | Status: AC
  Filled 2022-01-14: qty 1

## 2022-01-14 MED ORDER — AMISULPRIDE (ANTIEMETIC) 5 MG/2ML IV SOLN: 10.0000 mg | Freq: Once | INTRAVENOUS | Status: DC | PRN

## 2022-01-14 MED ORDER — FENTANYL CITRATE (PF) 100 MCG/2ML IJ SOLN
INTRAMUSCULAR | Status: AC
  Filled 2022-01-14: qty 2

## 2022-01-14 MED ORDER — PHENYLEPHRINE 80 MCG/ML (10ML) SYRINGE FOR IV PUSH (FOR BLOOD PRESSURE SUPPORT)
PREFILLED_SYRINGE | INTRAVENOUS | Status: AC
  Filled 2022-01-14: qty 10

## 2022-01-14 MED ORDER — ONDANSETRON HCL 4 MG/2ML IJ SOLN
INTRAMUSCULAR | Status: AC
  Filled 2022-01-14: qty 2

## 2022-01-14 MED ORDER — GENTAMICIN SULFATE 40 MG/ML IJ SOLN
5.0000 mg/kg | INTRAVENOUS | Status: AC
  Administered 2022-01-14: 340 mg via INTRAVENOUS
  Filled 2022-01-14: qty 8.5

## 2022-01-14 MED ORDER — SODIUM CHLORIDE 0.9 % IR SOLN
Status: DC | PRN
  Administered 2022-01-14 (×2): 3000 mL via INTRAVESICAL

## 2022-01-14 MED ORDER — ACETAMINOPHEN 500 MG PO TABS
1000.0000 mg | ORAL_TABLET | Freq: Once | ORAL | Status: AC
  Administered 2022-01-14: 1000 mg via ORAL
  Filled 2022-01-14: qty 2

## 2022-01-14 MED ORDER — EPHEDRINE 5 MG/ML INJ
INTRAVENOUS | Status: AC
  Filled 2022-01-14: qty 5

## 2022-01-14 MED ORDER — DEXAMETHASONE SODIUM PHOSPHATE 4 MG/ML IJ SOLN
INTRAMUSCULAR | Status: DC | PRN
  Administered 2022-01-14: 5 mg via INTRAVENOUS

## 2022-01-14 MED ORDER — LACTATED RINGERS IV SOLN: INTRAVENOUS | Status: DC

## 2022-01-14 MED ORDER — PHENYLEPHRINE 80 MCG/ML (10ML) SYRINGE FOR IV PUSH (FOR BLOOD PRESSURE SUPPORT)
PREFILLED_SYRINGE | INTRAVENOUS | Status: DC | PRN
  Administered 2022-01-14 (×4): 120 ug via INTRAVENOUS

## 2022-01-14 MED ORDER — DOCUSATE SODIUM 100 MG PO CAPS: 100.0000 mg | ORAL_CAPSULE | Freq: Every day | ORAL | 0 refills | Status: AC | PRN

## 2022-01-14 MED ORDER — IOHEXOL 300 MG/ML  SOLN
INTRAMUSCULAR | Status: DC | PRN
  Administered 2022-01-14: 19 mL

## 2022-01-14 MED ORDER — FENTANYL CITRATE (PF) 100 MCG/2ML IJ SOLN
INTRAMUSCULAR | Status: DC | PRN
  Administered 2022-01-14: 50 ug via INTRAVENOUS
  Administered 2022-01-14: 75 ug via INTRAVENOUS
  Administered 2022-01-14: 50 ug via INTRAVENOUS
  Administered 2022-01-14: 25 ug via INTRAVENOUS

## 2022-01-14 MED ORDER — PROPOFOL 10 MG/ML IV BOLUS
INTRAVENOUS | Status: AC
  Filled 2022-01-14: qty 20

## 2022-01-14 MED ORDER — PROPOFOL 10 MG/ML IV BOLUS
INTRAVENOUS | Status: DC | PRN
  Administered 2022-01-14: 80 mg via INTRAVENOUS
  Administered 2022-01-14: 120 mg via INTRAVENOUS

## 2022-01-14 MED ORDER — LIDOCAINE HCL (PF) 2 % IJ SOLN
INTRAMUSCULAR | Status: AC
  Filled 2022-01-14: qty 5

## 2022-01-14 MED ORDER — ORAL CARE MOUTH RINSE: 15.0000 mL | Freq: Once | OROMUCOSAL | Status: AC

## 2022-01-14 MED ORDER — FENTANYL CITRATE PF 50 MCG/ML IJ SOSY: 25.0000 ug | PREFILLED_SYRINGE | INTRAMUSCULAR | Status: DC | PRN

## 2022-01-14 SURGICAL SUPPLY — 25 items
BAG URO CATCHER STRL LF (MISCELLANEOUS) ×1 IMPLANT
BASKET ZERO TIP NITINOL 2.4FR (BASKET) IMPLANT
BENZOIN TINCTURE PRP APPL 2/3 (GAUZE/BANDAGES/DRESSINGS) IMPLANT
CATH FOLEY 2WAY SLVR  5CC 16FR (CATHETERS) ×1
CATH FOLEY 2WAY SLVR 5CC 16FR (CATHETERS) IMPLANT
CATH URETERAL DUAL LUMEN 10F (MISCELLANEOUS) IMPLANT
CATH URETL OPEN 5X70 (CATHETERS) ×1 IMPLANT
CLOTH BEACON ORANGE TIMEOUT ST (SAFETY) ×1 IMPLANT
DRSG TEGADERM 2-3/8X2-3/4 SM (GAUZE/BANDAGES/DRESSINGS) IMPLANT
FIBER LASER MOSES 200 DFL (Laser) IMPLANT
GLOVE BIOGEL M 7.0 STRL (GLOVE) ×1 IMPLANT
GOWN STRL REUS W/ TWL XL LVL3 (GOWN DISPOSABLE) ×1 IMPLANT
GOWN STRL REUS W/TWL XL LVL3 (GOWN DISPOSABLE) ×1
GUIDEWIRE STR DUAL SENSOR (WIRE) ×2 IMPLANT
GUIDEWIRE ZIPWRE .038 STRAIGHT (WIRE) IMPLANT
KIT TURNOVER KIT A (KITS) IMPLANT
LASER FIB FLEXIVA PULSE ID 365 (Laser) IMPLANT
MANIFOLD NEPTUNE II (INSTRUMENTS) ×1 IMPLANT
PACK CYSTO (CUSTOM PROCEDURE TRAY) ×1 IMPLANT
SHEATH NAVIGATOR HD 12/14X46 (SHEATH) IMPLANT
STENT URET 6FRX26 CONTOUR (STENTS) IMPLANT
TRACTIP FLEXIVA PULS ID 200XHI (Laser) IMPLANT
TRACTIP FLEXIVA PULSE ID 200 (Laser)
TUBING CONNECTING 10 (TUBING) ×1 IMPLANT
TUBING UROLOGY SET (TUBING) ×1 IMPLANT

## 2022-01-14 NOTE — Anesthesia Postprocedure Evaluation (Signed)
Anesthesia Post Note  Patient: Jack Fuentes  Procedure(s) Performed: CYSTOSCOPY/ RETROGRADE/URETEROSCOPY/HOLMIUM LASER/STENT PLACEMENT (Right)     Patient location during evaluation: PACU Anesthesia Type: General Level of consciousness: sedated Pain management: pain level controlled Vital Signs Assessment: post-procedure vital signs reviewed and stable Respiratory status: spontaneous breathing and respiratory function stable Cardiovascular status: stable Postop Assessment: no apparent nausea or vomiting Anesthetic complications: no  No notable events documented.  Last Vitals:  Vitals:   01/14/22 1200 01/14/22 1225  BP: 108/70 (!) 121/98  Pulse: 81 72  Resp: (!) 23   Temp:    SpO2: 94% 100%    Last Pain:  Vitals:   01/14/22 1200  TempSrc:   PainSc: 0-No pain                 [name] DANIEL

## 2022-01-14 NOTE — Op Note (Signed)
Operative Note  Preoperative diagnosis:  1.  Right proximal ureteral stone  Postoperative diagnosis: 1.  Right proximal ureteral stone and renal stones  Procedure(s): 1.  Cystoscopy 2. Right ureteroscopy with laser lithotripsy and basket extraction of stones 3. Right retrograde pyelogram 4. Right ureteral stent placement 5. Fluoroscopy with intraoperative interpretation 6. Right nephrostomy tube removal under fluoroscopy  Surgeon: Rexene Alberts, MD  Assistants:  None  Anesthesia:  General  Complications:  None  EBL:  Minimal  Specimens: 1. Stones for stone analysis (to be done at [facility])  Drains/Catheters: 1.  Right 6Fr x 26cm ureteral stent without a tether string  Intraoperative findings:   Cystoscopy demonstrated no suspicious bladder lesions Right ureteroscopy demonstrated an impacted proximal right ureteral stone measuring about 2 cm.  I initially tried to pass a wire alongside the stone however it would not bypass.  I also used semirigid ureteroscopy and stone was unable to bypass the stone with a safety wire.  Thus I elected to start treating the stone until I was able to safely pass a wire into the true lumen into the kidney.  I then completed treating his stone and all fragments were removed. Evidence of edema, inflammation and scarring at the proximal right ureter at the site of the impacted stone after stone fragments were all removed. Right retrograde pyelogram demonstrates severe right hydronephrosis, no extravasation of contrast. Successful right ureteral stent placement.  Indication:  Jack Fuentes is a 76 y.o. male with history of a proximal right ureteral stone that was impacted.  He ultimately underwent right nephrostomy tube placement as right ureteral stent was unable to be placed.  Back to the operating today for definitive treatment of right proximal ureteral stone and renal stones.  Description of procedure: After informed consent was  obtained from the patient, the patient was identified and taken to the operating room and placed in the supine position.  General anesthesia was administered as well as perioperative IV antibiotics.  At the beginning of the case, a time-out was performed to properly identify the patient, the surgery to be performed, and the surgical site.  Sequential compression devices were applied to the lower extremities at the beginning of the case for DVT prophylaxis.  The patient was then placed in the dorsal lithotomy supine position, prepped and draped in sterile fashion.  Preliminary scout fluoroscopy revealed that there was a 2 cm calcification area at the proximal right ureter, which corresponds to the stone found on the preoperative CT scan. We then passed the 21-French rigid cystoscope through the urethra and into the bladder under vision without any difficulty.  A systematic evaluation of the bladder revealed no evidence of any suspicious bladder lesions.  Ureteral orifices were in normal position.    I passed a sensor wire and 5 [place] open-ended cath into the distal right ureter.  A performed distal right retrograde pyelogram with contrast filling his distal right ureter.  It stopped abruptly at the level of the right proximal ureteral stone.  No contrast was able to bypass this area.  A Glidewire was also unable to be passed by the stone.  I then inserted a semirigid ureteroscope up to the level of the stone.  I attempted to bypass the stone with a wire however this was unsuccessful.  I then introduced a 200 m holmium laser fiber and proceeded to fragment the stone until I could identify his true lumen on the proximal side.  The stent as this was safely  accomplished, I then passed a 0.038 sensor wire into the right renal pelvis inserted and carried this to the drape as a safety wire.  I then reinserted the semirigid scope and proceeded to completely fragment his impacted right proximal ureteral stone.  All  fragments were removed.  Of note, he had significant inflammation and scarring in this area.  A portion of the stone was embedded in the ball of the ureter and this stone was removed.  I repeated right retrograde pyelogram with no evidence of extravasation contrast.  I then inserted a flexible ureteroscope and surveyed his kidney.  Identified a couple small stones in his renal pelvis that were dusted.  I then surveyed the kidney x3 and noted no other large stone fragments.  Under fluoroscopic guidance, his right nephrostomy tube was then removed in its entirety with the tape and suture intact.  I then withdrew the scope down the ureter noting no evidence of injury in the ureter and no remaining stone fragments.  I then passed a 6 [place] by 26 cm stent over the wire into the kidney with a curl noted within the renal pelvis and bladder respectively.  Urine was seen draining from the sideholes in the case.  I then drained his bladder and removed all stone fragments from his bladder.  The patient tolerated the procedure well and there was no complication. Patient was awoken from anesthesia and taken to the recovery room in stable condition. I was present and scrubbed for the entirety of the case.  Plan:  Patient will be discharged home.  Follow up with me in 7 to 10 days for stent removal in the office.   Matt R. [facility]  Pager: [phone]

## 2022-01-14 NOTE — Discharge Instructions (Signed)
[facility] [phone] Post Ureteroscopy With or Without Stent Instructions  Definitions:  Ureter: The duct that transports urine from the kidney to the bladder. Stent:   A plastic hollow tube that is placed into the ureter, from the kidney to the bladder to prevent the ureter from swelling shut.  GENERAL INSTRUCTIONS:  Despite the fact that no skin incisions were used, the area around the ureter and bladder is raw and irritated. The stent is a foreign body which will further irritate the bladder wall. This irritation is manifested by increased frequency of urination, both day and night, and by an increase in the urge to urinate. In some, the urge to urinate is present almost always. Sometimes the urge is strong enough that you may not be able to stop yourself from urinating. The only real cure is to remove the stent and then give time for the bladder wall to heal which can't be done until the danger of the ureter swelling shut has passed, which varies.  You may see some blood in your urine while the stent is in place and a few days afterwards. Do not be alarmed, even if the urine was clear for a while. Get off your feet and drink lots of fluids until clearing occurs. If you start to pass clots or don't improve, call us.  DIET: You may return to your normal diet immediately. Because of the raw surface of your bladder, alcohol, spicy foods, acid type foods and drinks with caffeine may cause irritation or frequency and should be used in moderation. To keep your urine flowing freely and to avoid constipation, drink plenty of fluids during the day ( 8-10 glasses ). Tip: Avoid cranberry juice because it is very acidic.  ACTIVITY: Your physical activity doesn't need to be restricted. However, if you are very active, you may see some blood in your urine. We suggest that you reduce your activity under these circumstances until the bleeding has stopped.  BOWELS: It is important to  keep your bowels regular during the postoperative period. Straining with bowel movements can cause bleeding. A bowel movement every other day is reasonable. Use a mild laxative if needed, such as Milk of Magnesia 2-3 tablespoons, or 2 Dulcolax tablets. Call if you continue to have problems. If you have been taking narcotics for pain, before, during or after your surgery, you may be constipated. Take a laxative if necessary.   MEDICATION: You should resume your pre-surgery medications unless told not to. In addition you will often be given an antibiotic to prevent infection. These should be taken as prescribed until the bottles are finished unless you are having an unusual reaction to one of the drugs.  PROBLEMS YOU SHOULD REPORT TO [place]: Fevers over 100.5 Fahrenheit. Heavy bleeding, or clots ( See above notes about blood in urine ). Inability to urinate. Drug reactions ( hives, rash, nausea, vomiting, diarrhea ). Severe burning or pain with urination that is not improving.  FOLLOW-UP: You will need a follow-up appointment to monitor your progress. Call for this appointment at the number listed above. Usually the first appointment will be about three to fourteen days after your surgery.  You have a right ureteral stent in place.  This will be removed in the office in 1 week and follow-up.  You have a dressing on your right flank corresponding to side the previously removed nephrostomy tube.  You may experience some leaking from the site over the next several days which is not unusual.

## 2022-01-14 NOTE — Anesthesia Procedure Notes (Signed)
Procedure Name: LMA Insertion Date/Time: 01/14/2022 9:25 AM  Performed by: Claudia Desanctis, CRNAPre-anesthesia Checklist: Emergency Drugs available, Patient identified, Suction available and Patient being monitored Patient Re-evaluated:Patient Re-evaluated prior to induction Oxygen Delivery Method: Circle system utilized Preoxygenation: Pre-oxygenation with 100% oxygen Induction Type: IV induction Ventilation: Mask ventilation without difficulty LMA: LMA inserted LMA Size: 4.0 Number of attempts: 1 Placement Confirmation: positive ETCO2 and breath sounds checked- equal and bilateral Tube secured with: Tape Dental Injury: Teeth and Oropharynx as per pre-operative assessment

## 2022-01-14 NOTE — H&P (Signed)
Office Visit Report     11/25/2021   --------------------------------------------------------------------------------   Jack Fuentes  MRN: [id number]  DOB: Oct 20, 1945, 76 year old Male  SSN:    PRIMARY CARE:    REFERRING:    PROVIDER:  Olene Craven. Cain Sieve, M.D.  TREATING:  Rexene Alberts, M.D.  LOCATION:  [facility] [phone]     --------------------------------------------------------------------------------   CC/HPI: Jack Fuentes is a 76 year old male who is seen in consultation today for right proximal ureteral stone and a solitary kidney.   #1. Right ureteral stone:  -He presented to ED on 11/15/2021 with shortness of breath, found to have anemia and CT demonstrated newly developed right hydronephrosis to the level of 2 proximal ureteral stones largest measuring around 7 mm. He also had small punctate stones in his right lower pole. His left kidney was also atrophic and and nonfunctioning.  -Creatinine was 2.4 from baseline of 1.7.  -Dr. Cain Sieve took him to the operating room for attempted right ureteral stent placement however this was unsuccessful. He underwent right nephrostomy tube placement.  -He denies prior history of urolithiasis. He has no prior medical expulsive therapy. No prior ureteroscopy or shockwave lithotripsy.  -He states his right PCN is draining clear to amber urine.  -He denies abdominal pain or flank pain.   #2. Solitary kidney:  -He has a history of non-Hodgkin's lymphoma and result developed left hydronephrosis resulting in atrophic left kidney. CT A/P 11/13/2021 demonstrated severe left renal atrophy.   Patient currently denies fever, chills, sweats, nausea, vomiting, abdominal or flank pain, gross hematuria or dysuria.   He has a history of non-Hodgkin's lymphoma, hypertension, iron deficiency anemia, chronic systolic heart failure, second-degree AV block, solitary kidney with CKD.     ALLERGIES: Levaquin Versed    MEDICATIONS:  Ferrous Sulfate  Gabapentin 300 mg capsule  Lasix     GU PSH: Cystoscopy Retrogrades - 11/15/2021     NON-GU PSH: No Non-GU PSH    GU PMH: No GU PMH    NON-GU PMH: No Non-GU PMH    FAMILY HISTORY: No Family History    SOCIAL HISTORY: No Social History    REVIEW OF SYSTEMS:    GU Review Male:   Patient denies hard to postpone urination, erection problems, burning/ pain with urination, penile pain, frequent urination, stream starts and stops, leakage of urine, have to strain to urinate , trouble starting your stream, and get up at night to urinate.  Gastrointestinal (Upper):   Patient denies nausea, vomiting, and indigestion/ heartburn.  Gastrointestinal (Lower):   Patient denies diarrhea and constipation.  Constitutional:   Patient denies fever, night sweats, weight loss, and fatigue.  Skin:   Patient denies skin rash/ lesion and itching.  Eyes:   Patient denies blurred vision and double vision.  Ears/ Nose/ Throat:   Patient denies sore throat and sinus problems.  Hematologic/Lymphatic:   Patient denies swollen glands and easy bruising.  Cardiovascular:   Patient denies leg swelling and chest pains.  Respiratory:   Patient denies cough and shortness of breath.  Endocrine:   Patient denies excessive thirst.  Musculoskeletal:   Patient denies back pain and joint pain.  Neurological:   Patient denies headaches and dizziness.  Psychologic:   Patient denies depression and anxiety.   VITAL SIGNS:      11/25/2021 11:17 AM  Weight 160 lb / 72.57 kg  Height 68 in / 172.72 cm  BP 115/72 mmHg  Pulse 90 /min  Temperature 97.5  F / 36.3 C  BMI 24.3 kg/m   MULTI-SYSTEM PHYSICAL EXAMINATION:    Constitutional: Well-nourished. No physical deformities. Normally developed. Good grooming.  Respiratory: No labored breathing, no use of accessory muscles.   Cardiovascular: Normal temperature, normal extremity pulses, no swelling, no varicosities.  Gastrointestinal: No mass, no tenderness, no  rigidity, non obese abdomen.     Complexity of Data:  Source Of History:  Patient, Medical Record Summary  Records Review:   Previous Doctor Records  Urine Test Review:   Urinalysis  X-Ray Review: C.T. Abdomen/Pelvis: Reviewed Films. Reviewed Report. Discussed With Patient.    Notes:                     ADDENDUM REPORT: 11/13/2021 20:42     ADDENDUM:  These results were called by telephone at the time of interpretation  on 11/13/2021 at 8:38 pm to provider Dr. Jodell Cipro , who verbally  acknowledged these results.        Electronically Signed  By: Jack Fuentes M.D.  On: 11/13/2021 20:42     Addended by Jack Asters, MD on 11/13/2021  8:44 PM   Study Result   Narrative & Impression  CLINICAL DATA: Concern for recurrence of non-Hodgkin's lymphoma.  Patient is status post small bowel resection.     EXAM:  CT ABDOMEN AND PELVIS WITHOUT CONTRAST     TECHNIQUE:  Multidetector CT imaging of the abdomen and pelvis was performed  following the standard protocol without IV contrast.     RADIATION DOSE REDUCTION: This exam was performed according to the  departmental dose-optimization program which includes automated  exposure control, adjustment of the mA and/or kV according to  patient size and/or use of iterative reconstruction technique.     COMPARISON: CT abdomen and pelvis 06/29/2018     FINDINGS:  Lower chest: There are moderate-sized bilateral pleural effusions  with compressive atelectasis in the bilateral lower lobes.     Hepatobiliary: There is diffuse gallbladder wall thickening with  surrounding inflammation. No gallstones are seen. No biliary ductal  dilatation. The liver is within normal limits.     Pancreas: Unremarkable. No pancreatic ductal dilatation or  surrounding inflammatory changes.     Spleen: Normal in size without focal abnormality.     Adrenals/Urinary Tract: Punctate calcifications in the right adrenal  gland are unchanged likely related to prior  infection or hemorrhage.  Left adrenal gland is within normal limits.     Severe left renal atrophy is unchanged. There are 2 calculi in the  proximal left ureter. The more distal measures 9 mm in the more  proximal measures 3 mm. There is resultant moderate/severe  right-sided hydronephrosis. There are additional nonobstructing  right renal calculi measuring up to 6 mm. There is a 3 mm calculus  in the bladder. The bladder is otherwise within normal limits.     Stomach/Bowel: Patient is status post small bowel resection.  Anastomosis is seen in the left mid abdomen. Small bowel loops just  proximal to this level demonstrate wall thickening with mesenteric  edema. There is no evidence for bowel obstruction or free air. No  pneumatosis identified. The appendix is within normal limits. The  stomach is within normal limits.     Vascular/Lymphatic: Aortic atherosclerosis. No enlarged abdominal or  pelvic lymph nodes.     Reproductive: Prostate gland is enlarged, unchanged.     Other: Fat containing inguinal hernias are again noted. There is a  small amount of free  fluid in the pelvis. There is mild body wall  edema.     Musculoskeletal: No acute or significant osseous findings.     IMPRESSION:  1. Patient is status post small bowel resection. Small bowel loops  proximal to the anastomosis demonstrate wall thickening and  mesenteric edema worrisome for nonspecific enteritis including  infectious, inflammatory and ischemic etiologies.  2. There are 2 calculi within the mid right ureter measuring up to 9  mm. There is moderate/severe right-sided hydronephrosis.  3. Gallbladder wall thickening with surrounding inflammation  concerning for cholecystitis.  4. Small volume ascites.  5. Bladder calculi and right renal calculi.  6. Moderate-sized bilateral pleural effusions.  7. Aortic Atherosclerosis (ICD10-I70.0).     Electronically Signed:  By: Jack Fuentes M.D.      PROCEDURES:  None   ASSESSMENT:      ICD-10 Details  1 GU:   Ureteral calculus - N20.1   2   History of urolithiasis - Z87.442   3   Atrophy of kidney - N26.1    PLAN:           Document Letter(s):  Created for Patient: Clinical Summary         Notes:    1. Right ureteral stone and solitary kidney:  -I reviewed imaging with CT A/P in 11/2021 with evidence of 2 separate obstructing proximal right ureteral stones with hydronephrosis. He does have right PCN in place.  -We discussed options including right ureteroscopy versus right PCNL to treat his proximal right ureteral stones. On imaging, he only has punctate right lower pole stones and no large renal stone.  -I offered ureteroscopy despite prior attempted unsuccessful stent placement. Imaging does not appear this would be favorable to laser and treat his stone retrograde which would save him inherent trauma associated PCNL. Given that he has a solitary kidney, I would favor this approach prior.  -We discussed risk that we would be unable to treat this urine with a retrograde approach and thus we would have to schedule him for PCNL.  -Discussed risk and benefits.  -Surgery letter has been sent.   Urology Preoperative H&P   Chief Complaint: Right ureteral stone  History of Present Illness: Jack Fuentes is a 76 y.o. male with right ureteral stone here for cystoscopy, R URS/LL, R stent, possible right PCN removal. Denies fevers, chills, dysuria. Received preoperative antibiotics.    Past Medical History:  Diagnosis Date   Anemia 10/19/2021   Cancer ([place])    lymphoma-large B-cell   Chronic kidney disease (CKD) 2020   History of kidney stones    History of left bundle branch block (LBBB)    Hyperlipidemia    Kidney stone 11/2021   Peripheral neuropathy    SBO (small bowel obstruction) ([place]) 06/2018   Sleep apnea    no longer uses cpap lost 50lbs    Past Surgical History:  Procedure Laterality Date   CYSTOSCOPY W/ URETERAL STENT  PLACEMENT Right 11/15/2021   Procedure: CYSTOSCOPY WITH RETROGRADE PYELOGRAM;  Surgeon: Vira Agar, MD;  Location: [place];  Service: Urology;  Laterality: Right;   EYE SURGERY     muscle imbalance   IR NEPHROSTOMY PLACEMENT RIGHT  11/16/2021   IR PATIENT EVAL TECH 0-60 MINS  08/01/2018   LAPAROTOMY N/A 07/03/2018   Procedure: EXPLORATORY LAPAROTOMY, LYSIS OF ADHESION, BOWEL RESECTION, RESECTION MESTASTATIC TUMOR;  Surgeon: Jovita Kussmaul, MD;  Location: [facility];  Service: General;  Laterality: N/A;   NASAL FRACTURE  SURGERY     SMALL BOWEL EMBOLIZATION  06/2018    Allergies:  Allergies  Allergen Reactions   Levofloxacin Swelling   Midazolam Swelling    Family History  Problem Relation Age of Onset   Heart disease Father    Kidney disease Father    Heart attack Father    Multiple myeloma Sister    Esophageal cancer Neg Hx    Colon cancer Neg Hx    Liver cancer Neg Hx     Social History:  reports that he has never smoked. He has never used smokeless tobacco. He reports current alcohol use. He reports that he does not use drugs.  ROS: A complete review of systems was performed.  All systems are negative except for pertinent findings as noted.  Physical Exam:  Vital signs in last 24 hours: Temp:  [97.7 F (36.5 C)] 97.7 F (36.5 C) (11/10 0707) Pulse Rate:  [79] 79 (11/10 0707) Resp:  [18] 18 (11/10 0707) BP: (132)/(87) 132/87 (11/10 0707) SpO2:  [99 %] 99 % (11/10 0707) Weight:  [71.7 kg] 71.7 kg (11/10 0711) Constitutional:  Alert and oriented, No acute distress Cardiovascular: Regular rate and rhythm Respiratory: Normal respiratory effort, Lungs clear bilaterally GI: Abdomen is soft, nontender, nondistended, no abdominal masses GU: No CVA tenderness Lymphatic: No lymphadenopathy Neurologic: Grossly intact, no focal deficits Psychiatric: Normal mood and affect  Laboratory Data:  No results for input(s): "WBC", "HGB", "HCT", "PLT" in the last 72 hours.  No  results for input(s): "NA", "K", "CL", "GLUCOSE", "BUN", "CALCIUM", "CREATININE" in the last 72 hours.  Invalid input(s): "CO3"   No results found for this or any previous visit (from the past 24 hour(s)). No results found for this or any previous visit (from the past 240 hour(s)).  Renal Function: Recent Labs    01/07/22 1144  CREATININE 1.50*   Estimated Creatinine Clearance: 41.8 mL/min (A) (by C-G formula based on SCr of 1.5 mg/dL (H)).  Radiologic Imaging: No results found.  I independently reviewed the above imaging studies.  Assessment and Plan ANGELINO RUMERY is a 76 y.o. male with right ureteral stone that is impacted here for cystoscopy, R URS/LL, R stent, possible right PCN removal   -Discussed risk of injury, infection, bleeding, potential need to stage procedure.  -The risks, benefits and alternatives of cystoscopy with R URS/LL, R JJ stent placement was discussed with the patient.  Risks include, but are not limited to: bleeding, urinary tract infection, ureteral injury, ureteral stricture disease, chronic pain, urinary symptoms, bladder injury, stent migration, the need for nephrostomy tube placement, MI, CVA, DVT, PE and the inherent risks with general anesthesia.  The patient voices understanding and wishes to proceed.    Matt R. Shailen Thielen MD 01/14/2022, 9:13 AM  [facility] Pager: [phone]): [phone]

## 2022-01-14 NOTE — Transfer of Care (Signed)
Immediate Anesthesia Transfer of Care Note  Patient: Jack Fuentes  Procedure(s) Performed: CYSTOSCOPY/ RETROGRADE/URETEROSCOPY/HOLMIUM LASER/STENT PLACEMENT (Right)  Patient Location: PACU  Anesthesia Type:General  Level of Consciousness: drowsy  Airway & Oxygen Therapy: Patient Spontanous Breathing and Patient connected to face mask  Post-op Assessment: Report given to RN and Post -op Vital signs reviewed and stable  Post vital signs: Reviewed and stable  Last Vitals:  Vitals Value Taken Time  BP 101/55 01/14/22 1130  Temp    Pulse 80 01/14/22 1132  Resp 17 01/14/22 1132  SpO2 100 % 01/14/22 1132  Vitals shown include unvalidated device data.  Last Pain:  Vitals:   01/14/22 0711  TempSrc:   PainSc: 0-No pain         Complications: No notable events documented.

## 2022-01-15 ENCOUNTER — Encounter (HOSPITAL_COMMUNITY): Payer: Self-pay | Admitting: Urology

## 2022-01-18 ENCOUNTER — Emergency Department (HOSPITAL_COMMUNITY)
Admission: EM | Admit: 2022-01-18 | Discharge: 2022-01-18 | Disposition: A | Discharge status: Home / Self Care | Payer: Medicare Other | Attending: Emergency Medicine | Admitting: Emergency Medicine

## 2022-01-18 ENCOUNTER — Encounter (HOSPITAL_COMMUNITY): Payer: Self-pay

## 2022-01-18 DIAGNOSIS — R000 Tachycardia, unspecified: Secondary | ICD-10-CM | POA: Insufficient documentation

## 2022-01-18 DIAGNOSIS — N390 Urinary tract infection, site not specified: Secondary | ICD-10-CM | POA: Insufficient documentation

## 2022-01-18 DIAGNOSIS — R339 Retention of urine, unspecified: Secondary | ICD-10-CM | POA: Diagnosis present

## 2022-01-18 LAB — URINALYSIS, ROUTINE W REFLEX MICROSCOPIC
Bacteria, UA: NONE SEEN
Bilirubin Urine: NEGATIVE
Glucose, UA: NEGATIVE mg/dL
Ketones, ur: NEGATIVE mg/dL
Nitrite: NEGATIVE
Protein, ur: 100 mg/dL — AB
RBC / HPF: >50 RBC/hpf — ABNORMAL HIGH (ref 0–5)
Specific Gravity, Urine: 1.011 (ref 1.005–1.030)
WBC, UA: >50 WBC/hpf — ABNORMAL HIGH (ref 0–5)
pH: 6 (ref 5.0–8.0)

## 2022-01-18 LAB — CBC WITH DIFFERENTIAL/PLATELET
Abs Immature Granulocytes: 0.06 10*3/uL (ref 0.00–0.07)
Basophils Absolute: 0 10*3/uL (ref 0.0–0.1)
Basophils Relative: 0 %
Eosinophils Absolute: 0.1 10*3/uL (ref 0.0–0.5)
Eosinophils Relative: 0 %
HCT: 43 % (ref 39.0–52.0)
Hemoglobin: 13.4 g/dL (ref 13.0–17.0)
Immature Granulocytes: 0 %
Lymphocytes Relative: 8 %
Lymphs Abs: 1.2 10*3/uL (ref 0.7–4.0)
MCH: 29.6 pg (ref 26.0–34.0)
MCHC: 31.2 g/dL (ref 30.0–36.0)
MCV: 94.9 fL (ref 80.0–100.0)
Monocytes Absolute: 1 10*3/uL (ref 0.1–1.0)
Monocytes Relative: 7 %
Neutro Abs: 12 10*3/uL — ABNORMAL HIGH (ref 1.7–7.7)
Neutrophils Relative %: 85 %
Platelets: 183 10*3/uL (ref 150–400)
RBC: 4.53 MIL/uL (ref 4.22–5.81)
RDW: 21.2 % — ABNORMAL HIGH (ref 11.5–15.5)
WBC: 14.4 10*3/uL — ABNORMAL HIGH (ref 4.0–10.5)
nRBC: 0 % (ref 0.0–0.2)

## 2022-01-18 LAB — COMPREHENSIVE METABOLIC PANEL
ALT: 17 U/L (ref 0–44)
AST: 18 U/L (ref 15–41)
Albumin: 3.2 g/dL — ABNORMAL LOW (ref 3.5–5.0)
Alkaline Phosphatase: 61 U/L (ref 38–126)
Anion gap: 7 (ref 5–15)
BUN: 23 mg/dL (ref 8–23)
CO2: 25 mmol/L (ref 22–32)
Calcium: 8.8 mg/dL — ABNORMAL LOW (ref 8.9–10.3)
Chloride: 100 mmol/L (ref 98–111)
Creatinine, Ser: 1.85 mg/dL — ABNORMAL HIGH (ref 0.61–1.24)
GFR, Estimated: 38 mL/min — ABNORMAL LOW (ref >60)
Glucose, Bld: 88 mg/dL (ref 70–99)
Potassium: 4.8 mmol/L (ref 3.5–5.1)
Sodium: 132 mmol/L — ABNORMAL LOW (ref 135–145)
Total Bilirubin: 0.7 mg/dL (ref 0.3–1.2)
Total Protein: 7.1 g/dL (ref 6.5–8.1)

## 2022-01-18 MED ORDER — SODIUM CHLORIDE 0.9 % IV SOLN
1.0000 g | Freq: Once | INTRAVENOUS | Status: AC
  Administered 2022-01-18: 1 g via INTRAVENOUS
  Filled 2022-01-18: qty 10

## 2022-01-18 MED ORDER — SODIUM CHLORIDE 0.9 % IV BOLUS
500.0000 mL | Freq: Once | INTRAVENOUS | Status: AC
  Administered 2022-01-18: 500 mL via INTRAVENOUS

## 2022-01-18 MED ORDER — CEPHALEXIN 500 MG PO CAPS: 500.0000 mg | ORAL_CAPSULE | Freq: Two times a day (BID) | ORAL | 0 refills | Status: AC

## 2022-01-18 NOTE — ED Provider Triage Note (Signed)
Emergency Medicine Provider Triage Evaluation Note  Jack Fuentes , a 76 y.o. male  was evaluated in triage.  Pt complains of suprapubic pain and difficulties urinating.  Patient had a cystoscopy, lithotripsy, retrograde pyelogram, right ureteral stent and removal of right nephrostomy tube on 11/10. Patient states directly after the procedure he was able to urinate okay however, on Saturday began having difficulties urinating.  Patient states he is only able to urinate small amounts. He endorses suprapubic pain.  Review of Systems  Positive: Difficulties urinating Negative: fever  Physical Exam  BP 131/84   Pulse (!) 113   Temp 98.3 F (36.8 C) (Oral)   Resp 18   Ht '5\' 8"'$  (1.727 m)   Wt 71.2 kg   SpO2 98%   BMI 23.87 kg/m  Gen:   Awake, no distress   Resp:  Normal effort  MSK:   Moves extremities without difficulty  Other:    Medical Decision Making  Medically screening exam initiated at 5:16 PM.  Appropriate orders placed.  PLACIDO HANGARTNER was informed that the remainder of the evaluation will be completed by another provider, this initial triage assessment does not replace that evaluation, and the importance of remaining in the ED until their evaluation is complete.  Labs UA  Bladder scan   Karie Kirks 01/18/22 1719

## 2022-01-18 NOTE — ED Triage Notes (Signed)
Pt arrived via POV, c/o urinary retention. States had procedure on Friday, has been okay until today. Today has started with urinary retention and pain.

## 2022-01-18 NOTE — ED Notes (Signed)
Unable to successfully obtain labs, provider aware and will place ultrasound IV

## 2022-01-18 NOTE — Discharge Instructions (Addendum)
Call the urologist tomorrow, 11/15 to let them know about you needing a Foley catheter.  They will help set up outpatient follow-up.  Otherwise, leave the catheter in and we are prescribing antibiotics to treat an acute bladder infection.  If you develop fever, vomiting, new or worsening pain, or any other new/concerning symptoms then return to the ER for evaluation.

## 2022-01-18 NOTE — ED Notes (Signed)
Bladder scan >450. MD S. Regenia Skeeter made aware , verbal for foley insertion

## 2022-01-18 NOTE — ED Provider Notes (Signed)
[facility] DEPT Provider Note   CSN: [id number] Arrival date & time: 01/18/22  1646     History  Chief Complaint  Patient presents with   Urinary Retention    Jack Fuentes is a 76 y.o. male.  HPI 76 year old male presents with urinary retention.  For the last couple days he has been having dysuria, hematuria and difficulty emptying his bladder.  He will try different positions and rocking back and forth.  He has been using his prescribed pain medicine for pain control.  Has been having tightness and distention on his lower abdomen/bladder.  No fevers or back pain.  4 days ago he had a right proximal ureteral stone treated.  He had his right nephrostomy tube removed as well as a right ureteral stent placement with laser lithotripsy and extraction of stones.  Prior to me seeing him, the nurse of [facility] him and he had over 450 cc of urine in the bladder and so she had placed a Foley catheter and now he has complete relief of his abdominal discomfort and urinary discomfort.  Home Medications Prior to Admission medications   Medication Sig Start Date End Date Taking? Authorizing Provider  cephALEXin (KEFLEX) 500 MG capsule Take 1 capsule (500 mg total) by mouth 2 (two) times daily for 7 days. 01/18/22 01/25/22 Yes Sherwood Gambler, MD  docusate sodium (COLACE) 100 MG capsule Take 1 capsule (100 mg total) by mouth daily as needed for up to 30 doses. 01/14/22   Janith Lima, MD  ferrous sulfate 325 (65 FE) MG tablet Take 1 tablet (325 mg total) by mouth every other day. Patient taking differently: Take 325 mg by mouth every 3 (three) days. 11/19/21 07/17/22  Leigh Aurora, DO  furosemide (LASIX) 40 MG tablet Take 1 tablet (40 mg total) by mouth daily. 11/18/21 11/18/22  Leigh Aurora, DO  gabapentin (NEURONTIN) 300 MG capsule Take 1 capsule (300 mg total) by mouth 2 (two) times daily. Patient taking differently: Take 300 mg by mouth at bedtime. 11/18/21 05/17/22   Leigh Aurora, DO  Multiple Vitamin (MULTIVITAMIN WITH MINERALS) TABS tablet Take 1 tablet by mouth daily. 07/14/18   Isabelle Course, MD  oxyCODONE-acetaminophen (PERCOCET) 5-325 MG tablet Take 1 tablet by mouth every 4 (four) hours as needed for up to 18 doses for severe pain. 01/14/22   Janith Lima, MD      Allergies    Levofloxacin and Midazolam    Review of Systems   Review of Systems  Constitutional:  Negative for fever.  Gastrointestinal:  Positive for abdominal pain. Negative for vomiting.  Genitourinary:  Positive for difficulty urinating, dysuria and hematuria.  Musculoskeletal:  Negative for back pain.    Physical Exam Updated Vital Signs BP 120/68 (BP Location: Left Arm)   Pulse 94   Temp 98.4 F (36.9 C) (Oral)   Resp 16   Ht '5\' 8"'$  (1.727 m)   Wt 71.2 kg   SpO2 95%   BMI 23.87 kg/m  Physical Exam Vitals and nursing note reviewed.  Constitutional:      Appearance: He is well-developed. He is not diaphoretic.  HENT:     Head: Normocephalic and atraumatic.  Cardiovascular:     Rate and Rhythm: Tachycardia present.  Pulmonary:     Effort: Pulmonary effort is normal.  Abdominal:     General: There is no distension.     Palpations: Abdomen is soft.     Tenderness: There is no abdominal tenderness.  There is no right CVA tenderness or left CVA tenderness.  Genitourinary:    Penis: Normal.      Comments: No penile tenderness. Foley catheter in place Skin:    General: Skin is warm and dry.  Neurological:     Mental Status: He is alert.    ED Results / Procedures / Treatments   Labs (all labs ordered are listed, but only abnormal results are displayed) Labs Reviewed  CBC WITH DIFFERENTIAL/PLATELET - Abnormal; Notable for the following components:      Result Value   WBC 14.4 (*)    RDW 21.2 (*)    Neutro Abs 12.0 (*)    All other components within normal limits  COMPREHENSIVE METABOLIC PANEL - Abnormal; Notable for the following components:   Sodium 132  (*)    Creatinine, Ser 1.85 (*)    Calcium 8.8 (*)    Albumin 3.2 (*)    GFR, Estimated 38 (*)    All other components within normal limits  URINALYSIS, ROUTINE W REFLEX MICROSCOPIC - Abnormal; Notable for the following components:   APPearance TURBID (*)    Hgb urine dipstick LARGE (*)    Protein, ur 100 (*)    Leukocytes,Ua LARGE (*)    RBC / HPF >50 (*)    WBC, UA >50 (*)    All other components within normal limits  URINE CULTURE    EKG None  Radiology No results found.  Procedures Ultrasound ED Peripheral IV (Provider)  Date/Time: 01/18/2022 8:26 PM  Performed by: Sherwood Gambler, MD Authorized by: Sherwood Gambler, MD   Procedure details:    Indications: multiple failed IV attempts and poor IV access     Skin Prep: chlorhexidine gluconate     Location:  Right AC   Angiocath:  20 G   Bedside Ultrasound Guided: Yes     Images: archived     Patient tolerated procedure without complications: Yes     Dressing applied: Yes       Medications Ordered in ED Medications  cefTRIAXone (ROCEPHIN) 1 g in sodium chloride 0.9 % 100 mL IVPB (0 g Intravenous Stopped 01/18/22 2233)  sodium chloride 0.9 % bolus 500 mL (0 mLs Intravenous Stopped 01/18/22 2233)    ED Course/ Medical Decision Making/ A&P                           Medical Decision Making Amount and/or Complexity of Data Reviewed External Data Reviewed: notes.    Details: Recent op note Labs: ordered.    Details: Leukocytosis.  He has a minimal bump in his creatinine compared to baseline though his baseline is not normal.  UA consistent with pyuria though no bacteria  Risk Prescription drug management.   Patient presents with acute urinary retention and difficulty urinating for couple days.  Fortunately his creatinine is minimally worse than baseline and he was given IV fluids and Foley catheter was placed.  He has pyuria which may be from the recent stent but in the setting of dysuria and hematuria will  also cover for a urinary tract infection.  Given IV Rocephin.  Given a small bolus of fluids.  He is doing well and otherwise hemodynamically stable besides some transient tachycardia that resolved.  Appears stable for discharge home with leg bag and follow-up with urology.        Final Clinical Impression(s) / ED Diagnoses Final diagnoses:  Urinary retention  Acute urinary tract infection  Rx / DC Orders ED Discharge Orders          Ordered    cephALEXin (KEFLEX) 500 MG capsule  2 times daily        01/18/22 2230              Sherwood Gambler, MD 01/18/22 2323

## 2022-01-18 NOTE — ED Notes (Signed)
Provider at bedside

## 2022-01-18 NOTE — ED Notes (Signed)
Pt educated on use of foley leg bag.

## 2022-01-21 LAB — URINE CULTURE: Culture: 50000 — AB

## 2022-01-22 ENCOUNTER — Telehealth (HOSPITAL_BASED_OUTPATIENT_CLINIC_OR_DEPARTMENT_OTHER): Payer: Self-pay | Admitting: Emergency Medicine

## 2022-01-22 NOTE — Progress Notes (Signed)
ED Antimicrobial Stewardship Positive Culture Follow Up   Jack Fuentes is an 76 y.o. male who presented to [facility] on 01/18/2022 with a chief complaint of  Chief Complaint  Patient presents with   Urinary Retention    Recent Results (from the past 720 hour(s))  Urine Culture     Status: Abnormal   Collection Time: 01/18/22  5:17 PM   Specimen: Urine, Catheterized  Result Value Ref Range Status   Specimen Description   Final    URINE, CATHETERIZED Performed at [facility] [address] [place], [place] [address]    Special Requests   Final    NONE Performed at [facility], [facility] [address] [place], [place] [address]    Culture (A)  Final    50,000 COLONIES/mL PROVIDENCIA STUARTII 40,000 COLONIES/mL ENTEROCOCCUS FAECALIS    Report Status 01/21/2022 FINAL  Final   Organism ID, Bacteria PROVIDENCIA STUARTII (A)  Final   Organism ID, Bacteria ENTEROCOCCUS FAECALIS (A)  Final      Susceptibility   Enterococcus faecalis - MIC*    AMPICILLIN <=2 SENSITIVE Sensitive     NITROFURANTOIN <=16 SENSITIVE Sensitive     VANCOMYCIN 1 SENSITIVE Sensitive     * 40,000 COLONIES/mL ENTEROCOCCUS FAECALIS   Providencia stuartii - MIC*    AMPICILLIN RESISTANT Resistant     CEFAZOLIN >=64 RESISTANT Resistant     CEFEPIME <=0.12 SENSITIVE Sensitive     CEFTRIAXONE <=0.25 SENSITIVE Sensitive     CIPROFLOXACIN <=0.25 SENSITIVE Sensitive     GENTAMICIN RESISTANT Resistant     IMIPENEM 2 SENSITIVE Sensitive     NITROFURANTOIN 128 RESISTANT Resistant     TRIMETH/SULFA <=20 SENSITIVE Sensitive     AMPICILLIN/SULBACTAM 16 INTERMEDIATE Intermediate     PIP/TAZO <=4 SENSITIVE Sensitive     * 50,000 COLONIES/mL PROVIDENCIA STUARTII    '[x]'$  Treated with cephalexin, organism resistant to prescribed antimicrobial '[]'$  Patient discharged originally without antimicrobial agent and treatment is now indicated  New antibiotic prescription:  - amoxicillin 500  mg PO q8h x 7 days  - Bactrim DS 1 tab PO BID x 7 days  ED Provider: Sherrell Puller, [facility]     Royetta Asal, PharmD, BCPS 01/22/2022 11:55 AM

## 2022-01-22 NOTE — Telephone Encounter (Signed)
Post ED Visit - Positive Culture Follow-up: Successful Patient Follow-Up  Culture assessed and recommendations reviewed by:  '[]'$  Elenor Quinones, Pharm.D. '[]'$  Heide Guile, Pharm.D., BCPS AQ-ID '[]'$  Parks Neptune, Pharm.D., BCPS '[]'$  Alycia Rossetti, Pharm.D., BCPS '[]'$  Collyer, Pharm.D., BCPS, AAHIVP '[]'$  Legrand Como, Pharm.D., BCPS, AAHIVP '[]'$  Salome Arnt, PharmD, BCPS '[]'$  Johnnette Gourd, PharmD, BCPS '[]'$  Hughes Better, PharmD, BCPS '[]'$  Leeroy Cha, PharmD  Positive urine culture  '[]'$  Patient discharged without antimicrobial prescription and treatment is now indicated '[x]'$  Organism is resistant to prescribed ED discharge antimicrobial '[]'$  Patient with positive blood cultures  Changes discussed with ED provider: Sherrell Puller PA New antibiotic prescription stop cephalexin, start amoxicillin '500mg'$  po q 8 hours x 7 days and start bactrim DS 1 tablet bid x 7 days Called to [address] [place]  Contacted patient, 01/22/2022 [place] 01/22/2022, 2:50 PM

## 2022-01-30 ENCOUNTER — Encounter: Payer: Self-pay | Admitting: Certified Registered Nurse Anesthetist

## 2022-01-31 ENCOUNTER — Telehealth: Payer: Self-pay | Admitting: Gastroenterology

## 2022-01-31 NOTE — Telephone Encounter (Signed)
Good Morning Dr. Loletha Carrow,  Patient called stating that he needed to cancel his procedure that he had scheduled for 11/29 for his colonoscopy and endoscopy. Patient stated that he needed to cancel due to having a surgery on 11/10 and feeling like he is not completely recovered from that procedure.   Patient stated he will call back at a later time to reschedule.

## 2022-02-01 NOTE — Telephone Encounter (Signed)
Understood, thank you for the update.  Please call and/or portal message this patient to let him know that I hope to hear from him within the next several weeks to reschedule the procedures for his iron deficiency anemia.  HD

## 2022-02-02 ENCOUNTER — Encounter: Payer: Medicare Other | Admitting: Gastroenterology

## 2022-02-06 ENCOUNTER — Other Ambulatory Visit: Payer: Self-pay

## 2022-02-06 ENCOUNTER — Encounter (HOSPITAL_COMMUNITY): Payer: Self-pay

## 2022-02-06 ENCOUNTER — Emergency Department (HOSPITAL_COMMUNITY)
Admission: EM | Admit: 2022-02-06 | Discharge: 2022-02-07 | Disposition: A | Discharge status: Home / Self Care | Payer: Medicare Other | Attending: Emergency Medicine | Admitting: Emergency Medicine

## 2022-02-06 DIAGNOSIS — N189 Chronic kidney disease, unspecified: Secondary | ICD-10-CM | POA: Insufficient documentation

## 2022-02-06 DIAGNOSIS — Z8572 Personal history of non-Hodgkin lymphomas: Secondary | ICD-10-CM | POA: Diagnosis not present

## 2022-02-06 DIAGNOSIS — N3001 Acute cystitis with hematuria: Secondary | ICD-10-CM | POA: Insufficient documentation

## 2022-02-06 DIAGNOSIS — R300 Dysuria: Secondary | ICD-10-CM | POA: Diagnosis present

## 2022-02-06 LAB — URINALYSIS, ROUTINE W REFLEX MICROSCOPIC
Bilirubin Urine: NEGATIVE
Glucose, UA: NEGATIVE mg/dL
Ketones, ur: NEGATIVE mg/dL
Nitrite: NEGATIVE
Protein, ur: 30 mg/dL — AB
Specific Gravity, Urine: 1.01 (ref 1.005–1.030)
WBC, UA: >50 WBC/hpf — ABNORMAL HIGH (ref 0–5)
pH: 6 (ref 5.0–8.0)

## 2022-02-06 LAB — BASIC METABOLIC PANEL
Anion gap: 6 (ref 5–15)
BUN: 14 mg/dL (ref 8–23)
CO2: 24 mmol/L (ref 22–32)
Calcium: 9 mg/dL (ref 8.9–10.3)
Chloride: 108 mmol/L (ref 98–111)
Creatinine, Ser: 1.6 mg/dL — ABNORMAL HIGH (ref 0.61–1.24)
GFR, Estimated: 45 mL/min — ABNORMAL LOW (ref >60)
Glucose, Bld: 94 mg/dL (ref 70–99)
Potassium: 4.4 mmol/L (ref 3.5–5.1)
Sodium: 138 mmol/L (ref 135–145)

## 2022-02-06 LAB — CBC
HCT: 36.2 % — ABNORMAL LOW (ref 39.0–52.0)
Hemoglobin: 11.7 g/dL — ABNORMAL LOW (ref 13.0–17.0)
MCH: 32.1 pg (ref 26.0–34.0)
MCHC: 32.3 g/dL (ref 30.0–36.0)
MCV: 99.5 fL (ref 80.0–100.0)
Platelets: 169 10*3/uL (ref 150–400)
RBC: 3.64 MIL/uL — ABNORMAL LOW (ref 4.22–5.81)
RDW: 18.1 % — ABNORMAL HIGH (ref 11.5–15.5)
WBC: 8.8 10*3/uL (ref 4.0–10.5)
nRBC: 0 % (ref 0.0–0.2)

## 2022-02-06 MED ORDER — SODIUM CHLORIDE 0.9 % IV SOLN
1.0000 g | Freq: Once | INTRAVENOUS | Status: AC
  Administered 2022-02-06: 1 g via INTRAVENOUS
  Filled 2022-02-06: qty 10

## 2022-02-06 NOTE — ED Provider Triage Note (Signed)
Emergency Medicine Provider Triage Evaluation Note  Jack Fuentes , a 76 y.o. male  was evaluated in triage.  Pt complains of dysuria. States that he was initially seen for same on 11/14 and was diagnosed with a UTI and given a prescription for Keflex.  States that on 11/17 he was called and told that his culture grew bacteria that was resistant to Keflex.  States he was changed to amoxicillin and Bactrim which he took in its entirety with relief of his symptoms.  States that when he stopped the medication a few days ago his symptoms returned.  Denies fevers or chills.  No abdominal pain, nausea, or vomiting.  Review of Systems  Positive:  Negative:   Physical Exam  BP 131/80 (BP Location: Left Arm)   Pulse 88   Temp 98.5 F (36.9 C) (Oral)   Resp 20   Ht '5\' 8"'$  (1.727 m)   Wt 72.6 kg   SpO2 99%   BMI 24.33 kg/m  Gen:   Awake, no distress   Resp:  Normal effort  MSK:   Moves extremities without difficulty  Other:    Medical Decision Making  Medically screening exam initiated at 7:01 PM.  Appropriate orders placed.  Jack Fuentes was informed that the remainder of the evaluation will be completed by another provider, this initial triage assessment does not replace that evaluation, and the importance of remaining in the ED until their evaluation is complete.     Nestor Lewandowsky 02/06/22 1903

## 2022-02-06 NOTE — ED Provider Notes (Signed)
[facility] DEPT Provider Note   CSN: [id number] Arrival date & time: 02/06/22  1758     History  Chief Complaint  Patient presents with   Fever   Dysuria    Jack Fuentes is a 76 y.o. male.  76 year old male presents today for evaluation of dysuria.  He states he was here 2 weeks ago with dysuria, hematuria, urinary retention.  He had a Foley catheter placed and started on antibiotics for UTI.  Initial antibiotic was changed due to resistance.  His symptoms resolved.  Until recently when he developed dysuria around Thursday or Friday.  Denies fever.  He reached out to [facility] urology yesterday was started on doxycycline.  He states his symptoms are persistent if not slightly worse.  Denies flank pain, abdominal pain, nausea, or vomiting.  The history is provided by the patient. No language interpreter was used.  Dysuria Presenting symptoms: dysuria   Associated symptoms: no abdominal pain, no fever, no flank pain, no nausea and no urinary frequency        Home Medications Prior to Admission medications   Medication Sig Start Date End Date Taking? Authorizing Provider  docusate sodium (COLACE) 100 MG capsule Take 1 capsule (100 mg total) by mouth daily as needed for up to 30 doses. 01/14/22   Janith Lima, MD  ferrous sulfate 325 (65 FE) MG tablet Take 1 tablet (325 mg total) by mouth every other day. Patient taking differently: Take 325 mg by mouth every 3 (three) days. 11/19/21 07/17/22  Leigh Aurora, DO  furosemide (LASIX) 40 MG tablet Take 1 tablet (40 mg total) by mouth daily. 11/18/21 11/18/22  Leigh Aurora, DO  gabapentin (NEURONTIN) 300 MG capsule Take 1 capsule (300 mg total) by mouth 2 (two) times daily. Patient taking differently: Take 300 mg by mouth at bedtime. 11/18/21 05/17/22  Leigh Aurora, DO  Multiple Vitamin (MULTIVITAMIN WITH MINERALS) TABS tablet Take 1 tablet by mouth daily. 07/14/18   Isabelle Course, MD  oxyCODONE-acetaminophen  (PERCOCET) 5-325 MG tablet Take 1 tablet by mouth every 4 (four) hours as needed for up to 18 doses for severe pain. 01/14/22   Janith Lima, MD      Allergies    Levofloxacin and Midazolam    Review of Systems   Review of Systems  Constitutional:  Negative for chills and fever.  Gastrointestinal:  Negative for abdominal pain and nausea.  Genitourinary:  Positive for dysuria. Negative for difficulty urinating, flank pain and frequency.  Neurological:  Negative for light-headedness.  All other systems reviewed and are negative.   Physical Exam Updated Vital Signs BP 134/84 (BP Location: Right Arm)   Pulse 70   Temp 98.5 F (36.9 C) (Oral)   Resp 16   Ht '5\' 8"'$  (1.727 m)   Wt 72.6 kg   SpO2 98%   BMI 24.33 kg/m  Physical Exam Vitals and nursing note reviewed.  Constitutional:      General: He is not in acute distress.    Appearance: Normal appearance. He is not ill-appearing.  HENT:     Head: Normocephalic and atraumatic.     Nose: Nose normal.  Eyes:     General: No scleral icterus.    Extraocular Movements: Extraocular movements intact.     Conjunctiva/sclera: Conjunctivae normal.  Cardiovascular:     Rate and Rhythm: Normal rate and regular rhythm.     Pulses: Normal pulses.  Pulmonary:     Effort: Pulmonary effort is  normal. No respiratory distress.     Breath sounds: Normal breath sounds. No wheezing or rales.  Abdominal:     General: There is no distension.     Palpations: Abdomen is soft.     Tenderness: There is no abdominal tenderness. There is no guarding.  Musculoskeletal:        General: Normal range of motion.     Cervical back: Normal range of motion.  Skin:    General: Skin is warm and dry.  Neurological:     General: No focal deficit present.     Mental Status: He is alert and oriented to person, place, and time. Mental status is at baseline.     ED Results / Procedures / Treatments   Labs (all labs ordered are listed, but only abnormal  results are displayed) Labs Reviewed  CBC - Abnormal; Notable for the following components:      Result Value   RBC 3.64 (*)    Hemoglobin 11.7 (*)    HCT 36.2 (*)    RDW 18.1 (*)    All other components within normal limits  BASIC METABOLIC PANEL - Abnormal; Notable for the following components:   Creatinine, Ser 1.60 (*)    GFR, Estimated 45 (*)    All other components within normal limits  URINALYSIS, ROUTINE W REFLEX MICROSCOPIC - Abnormal; Notable for the following components:   APPearance HAZY (*)    Hgb urine dipstick SMALL (*)    Protein, ur 30 (*)    Leukocytes,Ua LARGE (*)    WBC, UA >50 (*)    Bacteria, UA RARE (*)    All other components within normal limits  URINE CULTURE    EKG None  Radiology No results found.  Procedures Procedures    Medications Ordered in ED Medications  cefTRIAXone (ROCEPHIN) 1 g in sodium chloride 0.9 % 100 mL IVPB (has no administration in time range)    ED Course/ Medical Decision Making/ A&P                            Medical Decision Making Amount and/or Complexity of Data Reviewed Labs:  Decision-making details documented in ED Course.   Medical Decision Making / ED Course   This patient presents to the ED for concern of dysuria, this involves an extensive number of treatment options, and is a complaint that carries with it a high risk of complications and morbidity.  The differential diagnosis includes UTI, pyelonephritis, nephrolithiasis  MDM: 76 year old male presents today for evaluation of dysuria.  Started 3 days ago.  Recently treated for UTI with resolution of his symptoms.  UA shows evidence of UTI with leukocytes, and greater than 50 WBCs.  Without leukocytosis.  He was started on doxycycline yesterday by his urologist office.  He states since then his symptoms worsened.  Previous culture shows he is sensitive to ciprofloxacin, Bactrim.  He would like to switch his prescription.  He states he cannot tolerate  ciprofloxacin due to valvular issue.  We will switch to Bactrim.  Will give dose of Rocephin in the ED prior to discharge.  Patient is in agreement with plan.  Advised patient to call his urologist tomorrow.  Patient discussed with attending who agrees with plan.  Creatinine 1.6.  Mildly improved from recent visit.  Around baseline.  CBC without leukocytosis.  Mild anemia at baseline.   Additional history obtained: -Additional history obtained from recent ED visit -External  records from outside source obtained and reviewed including: Chart review including previous notes, labs, imaging, consultation notes   Lab Tests: -I ordered, reviewed, and interpreted labs.   The pertinent results include:   Labs Reviewed  CBC - Abnormal; Notable for the following components:      Result Value   RBC 3.64 (*)    Hemoglobin 11.7 (*)    HCT 36.2 (*)    RDW 18.1 (*)    All other components within normal limits  BASIC METABOLIC PANEL - Abnormal; Notable for the following components:   Creatinine, Ser 1.60 (*)    GFR, Estimated 45 (*)    All other components within normal limits  URINALYSIS, ROUTINE W REFLEX MICROSCOPIC - Abnormal; Notable for the following components:   APPearance HAZY (*)    Hgb urine dipstick SMALL (*)    Protein, ur 30 (*)    Leukocytes,Ua LARGE (*)    WBC, UA >50 (*)    Bacteria, UA RARE (*)    All other components within normal limits  URINE CULTURE      EKG  EKG Interpretation  Date/Time:    Ventricular Rate:    PR Interval:    QRS Duration:   QT Interval:    QTC Calculation:   R Axis:     Text Interpretation:          Medicines ordered and prescription drug management: Meds ordered this encounter  Medications   cefTRIAXone (ROCEPHIN) 1 g in sodium chloride 0.9 % 100 mL IVPB    Order Specific Question:   Antibiotic Indication:    Answer:   UTI    -I have reviewed the patients home medicines and have made adjustments as needed   Co morbidities that  complicate the patient evaluation  Past Medical History:  Diagnosis Date   Anemia 10/19/2021   Cancer (HCC)    lymphoma-large B-cell   Chronic kidney disease (CKD) 2020   History of kidney stones    History of left bundle branch block (LBBB)    Hyperlipidemia    Kidney stone 11/2021   Peripheral neuropathy    SBO (small bowel obstruction) ([place]) 06/2018   Sleep apnea    no longer uses cpap lost 50lbs      Dispostion: Patient is appropriate for discharge.  Discharged in stable condition.  Return precautions discussed.  Patient voices understanding and is in agreement with plan.  Final Clinical Impression(s) / ED Diagnoses Final diagnoses:  Acute cystitis with hematuria    Rx / DC Orders ED Discharge Orders          Ordered    sulfamethoxazole-trimethoprim (BACTRIM DS) 800-160 MG tablet  2 times daily        02/07/22 0002              Evlyn Courier, PA-C 02/07/22 0003    Wyvonnia Dusky, MD 02/09/22 [phone]

## 2022-02-06 NOTE — ED Triage Notes (Addendum)
Patient reports that he has been treated for a UTI twice. Patient states that he began having dysuria again 2 days ago. Patient states that he looked up his urine culture results and Read that he needed IV antibiotics. Patient states his urine is cloudy.

## 2022-02-07 MED ORDER — SULFAMETHOXAZOLE-TRIMETHOPRIM 800-160 MG PO TABS: 1.0000 | ORAL_TABLET | Freq: Two times a day (BID) | ORAL | 0 refills | Status: AC

## 2022-02-07 NOTE — Discharge Instructions (Signed)
Your exam and work-up today was overall reassuring.  Based off of your recent urine culture and sensitivities we we will switch her antibiotic to Bactrim.  You received a dose of Rocephin here in the emergency room.  Please call your urologist to let them know of your symptoms.  For any worsening symptoms please return to the emergency room.

## 2022-02-08 ENCOUNTER — Other Ambulatory Visit: Payer: Self-pay

## 2022-02-09 LAB — URINE CULTURE: Culture: 30000 — AB

## 2022-02-10 ENCOUNTER — Telehealth (HOSPITAL_BASED_OUTPATIENT_CLINIC_OR_DEPARTMENT_OTHER): Payer: Self-pay

## 2022-02-10 NOTE — Telephone Encounter (Addendum)
Called and spoke with patient per patient he did not request a refill, will not be needing a appointment with [place].

## 2022-02-10 NOTE — Telephone Encounter (Signed)
Post ED Visit - Positive Culture Follow-up  Culture report reviewed by antimicrobial stewardship pharmacist: [place] Team '[]'$  Elenor Quinones, Pharm.D. '[x]'$  Heide Guile, Pharm.D., BCPS AQ-ID '[]'$  Parks Neptune, Pharm.D., BCPS '[]'$  Alycia Rossetti, Pharm.D., BCPS '[]'$  Elizabeth, Pharm.D., BCPS, AAHIVP '[]'$  Legrand Como, Pharm.D., BCPS, AAHIVP '[]'$  Salome Arnt, PharmD, BCPS '[]'$  Johnnette Gourd, PharmD, BCPS '[]'$  Hughes Better, PharmD, BCPS '[]'$  Leeroy Cha, PharmD '[]'$  Laqueta Linden, PharmD, BCPS '[]'$  Albertina Parr, PharmD  [place] Team '[]'$  Leodis Sias, PharmD '[]'$  Lindell Spar, PharmD '[]'$  Royetta Asal, PharmD '[]'$  Graylin Shiver, Rph '[]'$  Rema Fendt) Glennon Mac, PharmD '[]'$  Arlyn Dunning, PharmD '[]'$  Netta Cedars, PharmD '[]'$  Dia Sitter, PharmD '[]'$  Leone Haven, PharmD '[]'$  Gretta Arab, PharmD '[]'$  Theodis Shove, PharmD '[]'$  Peggyann Juba, PharmD '[]'$  Reuel Boom, PharmD   Positive urine culture Treated with Sulfamethoxazole-Trimethoprim, organism sensitive to the same and no further patient follow-up is required at this time.  Glennon Hamilton 02/10/2022, 9:50 AM

## 2022-03-09 ENCOUNTER — Encounter (HOSPITAL_COMMUNITY)
Admission: RE | Disposition: A | Discharge status: Home / Self Care | Payer: Self-pay | Source: Ambulatory Visit | Attending: Urology

## 2022-03-09 ENCOUNTER — Other Ambulatory Visit: Payer: Self-pay | Admitting: Urology

## 2022-03-09 ENCOUNTER — Inpatient Hospital Stay (HOSPITAL_BASED_OUTPATIENT_CLINIC_OR_DEPARTMENT_OTHER): Payer: Medicare Other | Admitting: Anesthesiology

## 2022-03-09 ENCOUNTER — Other Ambulatory Visit: Payer: Self-pay

## 2022-03-09 ENCOUNTER — Encounter (HOSPITAL_COMMUNITY): Payer: Self-pay | Admitting: Urology

## 2022-03-09 ENCOUNTER — Observation Stay (HOSPITAL_COMMUNITY)
Admission: RE | Admit: 2022-03-09 | Discharge: 2022-03-10 | Disposition: A | Discharge status: Home / Self Care | Payer: Medicare Other | Source: Ambulatory Visit | Attending: Urology | Admitting: Urology

## 2022-03-09 ENCOUNTER — Inpatient Hospital Stay (HOSPITAL_COMMUNITY): Payer: Medicare Other | Admitting: Anesthesiology

## 2022-03-09 ENCOUNTER — Inpatient Hospital Stay (HOSPITAL_COMMUNITY): Payer: Medicare Other

## 2022-03-09 DIAGNOSIS — N189 Chronic kidney disease, unspecified: Secondary | ICD-10-CM | POA: Diagnosis not present

## 2022-03-09 DIAGNOSIS — N1330 Unspecified hydronephrosis: Secondary | ICD-10-CM

## 2022-03-09 DIAGNOSIS — Z79899 Other long term (current) drug therapy: Secondary | ICD-10-CM | POA: Insufficient documentation

## 2022-03-09 DIAGNOSIS — N210 Calculus in bladder: Secondary | ICD-10-CM | POA: Diagnosis not present

## 2022-03-09 DIAGNOSIS — Z905 Acquired absence of kidney: Secondary | ICD-10-CM

## 2022-03-09 DIAGNOSIS — Z85828 Personal history of other malignant neoplasm of skin: Secondary | ICD-10-CM | POA: Insufficient documentation

## 2022-03-09 DIAGNOSIS — N201 Calculus of ureter: Principal | ICD-10-CM | POA: Diagnosis present

## 2022-03-09 DIAGNOSIS — N281 Cyst of kidney, acquired: Secondary | ICD-10-CM | POA: Diagnosis not present

## 2022-03-09 DIAGNOSIS — Z960 Presence of urogenital implants: Secondary | ICD-10-CM | POA: Insufficient documentation

## 2022-03-09 DIAGNOSIS — Q600 Renal agenesis, unilateral: Secondary | ICD-10-CM | POA: Diagnosis not present

## 2022-03-09 HISTORY — PX: CYSTOSCOPY WITH RETROGRADE PYELOGRAM, URETEROSCOPY AND STENT PLACEMENT: SHX5789

## 2022-03-09 LAB — POCT I-STAT, CHEM 8
BUN: 20 mg/dL (ref 8–23)
Calcium, Ion: 1.26 mmol/L (ref 1.15–1.40)
Chloride: 102 mmol/L (ref 98–111)
Creatinine, Ser: 2 mg/dL — ABNORMAL HIGH (ref 0.61–1.24)
Glucose, Bld: 85 mg/dL (ref 70–99)
HCT: 41 % (ref 39.0–52.0)
Hemoglobin: 13.9 g/dL (ref 13.0–17.0)
Potassium: 4.6 mmol/L (ref 3.5–5.1)
Sodium: 137 mmol/L (ref 135–145)
TCO2: 23 mmol/L (ref 22–32)

## 2022-03-09 SURGERY — CYSTOURETEROSCOPY, WITH RETROGRADE PYELOGRAM AND STENT INSERTION
Anesthesia: General | Site: Urethra | Laterality: Right

## 2022-03-09 MED ORDER — SODIUM CHLORIDE 0.9 % IR SOLN
Status: DC | PRN
  Administered 2022-03-09: 3000 mL

## 2022-03-09 MED ORDER — GABAPENTIN 300 MG PO CAPS
300.0000 mg | ORAL_CAPSULE | Freq: Every day | ORAL | Status: DC
  Administered 2022-03-10: 300 mg via ORAL
  Filled 2022-03-09: qty 1

## 2022-03-09 MED ORDER — FENTANYL CITRATE (PF) 100 MCG/2ML IJ SOLN
INTRAMUSCULAR | Status: AC
  Filled 2022-03-09: qty 2

## 2022-03-09 MED ORDER — PROPOFOL 10 MG/ML IV BOLUS
INTRAVENOUS | Status: DC | PRN
  Administered 2022-03-09: 150 mg via INTRAVENOUS

## 2022-03-09 MED ORDER — LIDOCAINE 2% (20 MG/ML) 5 ML SYRINGE
INTRAMUSCULAR | Status: DC | PRN
  Administered 2022-03-09: 70 mg via INTRAVENOUS

## 2022-03-09 MED ORDER — ONDANSETRON HCL 4 MG/2ML IJ SOLN
INTRAMUSCULAR | Status: DC | PRN
  Administered 2022-03-09: 4 mg via INTRAVENOUS

## 2022-03-09 MED ORDER — SENNOSIDES-DOCUSATE SODIUM 8.6-50 MG PO TABS: 1.0000 | ORAL_TABLET | Freq: Every evening | ORAL | Status: DC | PRN

## 2022-03-09 MED ORDER — ACETAMINOPHEN 10 MG/ML IV SOLN
INTRAVENOUS | Status: DC | PRN
  Administered 2022-03-09: 1000 mg via INTRAVENOUS

## 2022-03-09 MED ORDER — FENTANYL CITRATE (PF) 250 MCG/5ML IJ SOLN
INTRAMUSCULAR | Status: DC | PRN
  Administered 2022-03-09 (×4): 25 ug via INTRAVENOUS

## 2022-03-09 MED ORDER — PHENYLEPHRINE HCL-NACL 20-0.9 MG/250ML-% IV SOLN
INTRAVENOUS | Status: DC | PRN
  Administered 2022-03-09: 70 ug/min via INTRAVENOUS

## 2022-03-09 MED ORDER — OXYCODONE HCL 5 MG PO TABS: 5.0000 mg | ORAL_TABLET | Freq: Once | ORAL | Status: DC | PRN

## 2022-03-09 MED ORDER — HYDROCODONE-ACETAMINOPHEN 5-325 MG PO TABS
1.0000 | ORAL_TABLET | ORAL | Status: DC | PRN
  Administered 2022-03-10: 1 via ORAL
  Filled 2022-03-09 (×2): qty 1

## 2022-03-09 MED ORDER — ONDANSETRON HCL 4 MG/2ML IJ SOLN
INTRAMUSCULAR | Status: AC
  Filled 2022-03-09: qty 2

## 2022-03-09 MED ORDER — GLYCOPYRROLATE 0.2 MG/ML IJ SOLN
INTRAMUSCULAR | Status: DC | PRN
  Administered 2022-03-09: .2 mg via INTRAVENOUS

## 2022-03-09 MED ORDER — PHENYLEPHRINE HCL (PRESSORS) 10 MG/ML IV SOLN
INTRAVENOUS | Status: AC
  Filled 2022-03-09: qty 1

## 2022-03-09 MED ORDER — OXYCODONE HCL 5 MG/5ML PO SOLN: 5.0000 mg | Freq: Once | ORAL | Status: DC | PRN

## 2022-03-09 MED ORDER — PIPERACILLIN-TAZOBACTAM 3.375 G IVPB 30 MIN
3.3750 g | Freq: Once | INTRAVENOUS | Status: AC
  Administered 2022-03-09: 3.375 g via INTRAVENOUS
  Filled 2022-03-09: qty 50

## 2022-03-09 MED ORDER — PIPERACILLIN-TAZOBACTAM 3.375 G IVPB
3.3750 g | Freq: Three times a day (TID) | INTRAVENOUS | Status: DC
  Administered 2022-03-10 (×2): 3.375 g via INTRAVENOUS
  Filled 2022-03-09 (×2): qty 50

## 2022-03-09 MED ORDER — CHLORHEXIDINE GLUCONATE 0.12 % MT SOLN
15.0000 mL | Freq: Once | OROMUCOSAL | Status: AC
  Administered 2022-03-09: 15 mL via OROMUCOSAL

## 2022-03-09 MED ORDER — PROPOFOL 10 MG/ML IV BOLUS
INTRAVENOUS | Status: AC
  Filled 2022-03-09: qty 20

## 2022-03-09 MED ORDER — ACETAMINOPHEN 325 MG PO TABS: 650.0000 mg | ORAL_TABLET | ORAL | Status: DC | PRN

## 2022-03-09 MED ORDER — PROMETHAZINE HCL 25 MG/ML IJ SOLN: 6.2500 mg | INTRAMUSCULAR | Status: DC | PRN

## 2022-03-09 MED ORDER — DEXTROSE-NACL 5-0.45 % IV SOLN
INTRAVENOUS | Status: DC
  Filled 2022-03-09: qty 1000

## 2022-03-09 MED ORDER — ACETAMINOPHEN 10 MG/ML IV SOLN
INTRAVENOUS | Status: AC
  Filled 2022-03-09: qty 100

## 2022-03-09 MED ORDER — CLOPIDOGREL BISULFATE 75 MG PO TABS
75.0000 mg | ORAL_TABLET | Freq: Every day | ORAL | Status: DC
  Administered 2022-03-10: 75 mg via ORAL
  Filled 2022-03-09: qty 1

## 2022-03-09 MED ORDER — DEXAMETHASONE SODIUM PHOSPHATE 10 MG/ML IJ SOLN
INTRAMUSCULAR | Status: AC
  Filled 2022-03-09: qty 1

## 2022-03-09 MED ORDER — DEXAMETHASONE SODIUM PHOSPHATE 10 MG/ML IJ SOLN
INTRAMUSCULAR | Status: DC | PRN
  Administered 2022-03-09: 8 mg via INTRAVENOUS

## 2022-03-09 MED ORDER — LACTATED RINGERS IV SOLN: INTRAVENOUS | Status: DC

## 2022-03-09 MED ORDER — ONDANSETRON HCL 4 MG/2ML IJ SOLN: 4.0000 mg | INTRAMUSCULAR | Status: DC | PRN

## 2022-03-09 MED ORDER — FENTANYL CITRATE PF 50 MCG/ML IJ SOSY: 25.0000 ug | PREFILLED_SYRINGE | INTRAMUSCULAR | Status: DC | PRN

## 2022-03-09 MED ORDER — LIDOCAINE HCL URETHRAL/MUCOSAL 2 % EX GEL
CUTANEOUS | Status: AC
  Filled 2022-03-09: qty 30

## 2022-03-09 MED ORDER — MEPERIDINE HCL 50 MG/ML IJ SOLN: 6.2500 mg | INTRAMUSCULAR | Status: DC | PRN

## 2022-03-09 SURGICAL SUPPLY — 22 items
BAG COUNTER SPONGE SURGICOUNT (BAG) IMPLANT
BAG URO CATCHER STRL LF (MISCELLANEOUS) ×1 IMPLANT
BASKET ZERO TIP NITINOL 2.4FR (BASKET) IMPLANT
CATH URETL OPEN END 6FR 70 (CATHETERS) IMPLANT
CLOTH BEACON ORANGE TIMEOUT ST (SAFETY) ×1 IMPLANT
GLOVE SURG LX STRL 7.5 STRW (GLOVE) ×1 IMPLANT
GOWN STRL REUS W/ TWL XL LVL3 (GOWN DISPOSABLE) ×1 IMPLANT
GOWN STRL REUS W/TWL XL LVL3 (GOWN DISPOSABLE) ×1
GUIDEWIRE STR DUAL SENSOR (WIRE) ×1 IMPLANT
GUIDEWIRE ZIPWRE .038 STRAIGHT (WIRE) IMPLANT
IV NS 1000ML (IV SOLUTION) ×1
IV NS 1000ML BAXH (IV SOLUTION) ×1 IMPLANT
KIT TURNOVER KIT A (KITS) IMPLANT
LASER FIB FLEXIVA PULSE ID 365 (Laser) IMPLANT
MANIFOLD NEPTUNE II (INSTRUMENTS) ×1 IMPLANT
PACK CYSTO (CUSTOM PROCEDURE TRAY) ×1 IMPLANT
SHEATH NAVIGATOR HD 12/14X36 (SHEATH) IMPLANT
STENT URET 6FRX26 CONTOUR (STENTS) IMPLANT
TRACTIP FLEXIVA PULS ID 200XHI (Laser) IMPLANT
TRACTIP FLEXIVA PULSE ID 200 (Laser)
TUBING CONNECTING 10 (TUBING) ×1 IMPLANT
TUBING UROLOGY SET (TUBING) ×1 IMPLANT

## 2022-03-09 NOTE — Anesthesia Procedure Notes (Signed)
Procedure Name: LMA Insertion Date/Time: 03/09/2022 6:14 PM  Performed by: Cleda Daub, CRNAPre-anesthesia Checklist: Patient identified, Emergency Drugs available, Suction available and Patient being monitored Patient Re-evaluated:Patient Re-evaluated prior to induction Oxygen Delivery Method: Circle system utilized Preoxygenation: Pre-oxygenation with 100% oxygen Induction Type: IV induction LMA: LMA inserted LMA Size: 4.0 Number of attempts: 1 Placement Confirmation: positive ETCO2 and breath sounds checked- equal and bilateral Tube secured with: Tape Dental Injury: Teeth and Oropharynx as per pre-operative assessment

## 2022-03-09 NOTE — Anesthesia Preprocedure Evaluation (Addendum)
Anesthesia Evaluation  Patient identified by MRN, date of birth, ID band Patient awake    Reviewed: Allergy & Precautions, NPO status , Patient's Chart, lab work & pertinent test results  History of Anesthesia Complications Negative for: history of anesthetic complications  Airway Mallampati: II  TM Distance: >3 FB Neck ROM: Full    Dental  (+) Chipped, Implants, Dental Advisory Given   Pulmonary sleep apnea (no longer uses cpap)    breath sounds clear to auscultation       Cardiovascular hypertension, Pt. on medications (-) angina  Rhythm:Regular Rate:Normal + Systolic murmurs LBBB  04/9560 ECHO:   1. EF 55-60%. The LV has normal function, no regional wall motion abnormalities.  Grade II diastolic dysfunction (pseudonormalization). Elevated left atrial pressure.   2. RVF is normal. The right ventricular size is normal. There is moderately elevated pulmonary artery systolic pressure.   3. Left atrial size was mild to moderately dilated.   4. The mitral valve is abnormal. Moderate MR. No MS.   5. The tricuspid valve is abnormal. TR is mild-moderate.   6. The aortic valve was not well visualized. There is mild calcification of the aortic valve. There is mild thickening of the aortic valve. AI is mild to moderate. No AS.     Neuro/Psych negative neurological ROS     GI/Hepatic negative GI ROS, Neg liver ROS,,,  Endo/Other  negative endocrine ROS    Renal/GU Renal InsufficiencyRenal disease     Musculoskeletal   Abdominal   Peds  Hematology Lymphoma: remission x20 years   Anesthesia Other Findings   Reproductive/Obstetrics                             Anesthesia Physical Anesthesia Plan  ASA: 2  Anesthesia Plan: General   Post-op Pain Management: Minimal or no pain anticipated   Induction: Intravenous  PONV Risk Score and Plan: 2 and Ondansetron and Dexamethasone  Airway Management  Planned: LMA  Additional Equipment: None  Intra-op Plan:   Post-operative Plan:   Informed Consent: I have reviewed the patients History and Physical, chart, labs and discussed the procedure including the risks, benefits and alternatives for the proposed anesthesia with the patient or authorized representative who has indicated his/her understanding and acceptance.     Dental advisory given  Plan Discussed with: CRNA and Surgeon  Anesthesia Plan Comments:         Anesthesia Quick Evaluation

## 2022-03-09 NOTE — H&P (Addendum)
HPI: 77 year old man whose past medical history includesCHF, non-Hodgkins lymphoma, anemia, atrophied left kidney and nephrolithiasis presented to the office with concerns of severe dysuria, overall fatigue, frequency and urgency of urination and suprapubic pain associated with one episode of gross hematuria. He underwent right sided ureteroscopy on 01/24/22 and subsequent right stent removal on 02/06/22. Since stent removal, he has been having bothersome and significant lower urinary tract symptoms which have been evaluated by our office three times over the past month, and once by the emergency department. His urine cultures have been positive for serratia which has been resistant to oral options with the exception of bactrim. He has been on two rounds of bactrim and is even taking nightly trimethoprim with no relief of his lower urinary complaints.   Today, he has been feeling poorly and had an episode of gross hematuria and difficulty voiding. He underwent CT imaging which showed continued right sided hydroureternephrosis and a 4-26m calculus near the right UVJ.

## 2022-03-09 NOTE — H&P (Signed)
Urology H&P    History of Present Illness: Jack Fuentes is a 77 y.o. male with right distal ureteral calculus.  He is s/p right USM with laser lithotripsy in November 2023.  He has had ongoing urinary symptoms including dysuria.  He has been treated for UTI's.  Recent culture grew serratia. CT today shows a 5 mm calculus and the right UVJ with associated obstruction and atrophic left kidney.  No fever or chills.  No gross hematuria.   Past Medical History:  Diagnosis Date   Anemia 10/19/2021   Cancer ([place])    lymphoma-large B-cell   Chronic kidney disease (CKD) 2020   History of kidney stones    History of left bundle branch block (LBBB)    Hyperlipidemia    Kidney stone 11/2021   Peripheral neuropathy    SBO (small bowel obstruction) ([place]) 06/2018   Sleep apnea    no longer uses cpap lost 50lbs    Past Surgical History:  Procedure Laterality Date   CYSTOSCOPY W/ URETERAL STENT PLACEMENT Right 11/15/2021   Procedure: CYSTOSCOPY WITH RETROGRADE PYELOGRAM;  Surgeon: Vira Agar, MD;  Location: [facility];  Service: Urology;  Laterality: Right;   CYSTOSCOPY/URETEROSCOPY/HOLMIUM LASER/STENT PLACEMENT Right 01/14/2022   Procedure: CYSTOSCOPY/ RETROGRADE/URETEROSCOPY/HOLMIUM LASER/STENT PLACEMENT;  Surgeon: Janith Lima, MD;  Location: [facility];  Service: Urology;  Laterality: Right;   EYE SURGERY     muscle imbalance   IR NEPHROSTOMY PLACEMENT RIGHT  11/16/2021   IR PATIENT EVAL TECH 0-60 MINS  08/01/2018   LAPAROTOMY N/A 07/03/2018   Procedure: EXPLORATORY LAPAROTOMY, LYSIS OF ADHESION, BOWEL RESECTION, RESECTION MESTASTATIC TUMOR;  Surgeon: Jovita Kussmaul, MD;  Location: [facility];  Service: General;  Laterality: N/A;   NASAL FRACTURE SURGERY     SMALL BOWEL EMBOLIZATION  06/2018    Medications:  Scheduled Meds: Continuous Infusions:  acetaminophen     lactated ringers 10 mL/hr at 03/09/22 1809   piperacillin-tazobactam     PRN Meds:.acetaminophen, sodium chloride  irrigation  Allergies:  Allergies  Allergen Reactions   Levofloxacin Swelling   Midazolam Swelling    Family History  Problem Relation Age of Onset   Heart disease Father    Kidney disease Father    Heart attack Father    Multiple myeloma Sister    Esophageal cancer Neg Hx    Colon cancer Neg Hx    Liver cancer Neg Hx     Social History:  reports that he has never smoked. He has never used smokeless tobacco. He reports current alcohol use. He reports that he does not use drugs.  ROS: A complete review of systems was performed.  All systems are negative except for pertinent findings as noted.  Physical Exam:  Vital signs in last 24 hours: Temp:  [97.8 F (36.6 C)] 97.8 F (36.6 C) (01/03 1748) Pulse Rate:  [71] 71 (01/03 1748) Resp:  [14] 14 (01/03 1748) BP: (127)/(64) 127/64 (01/03 1748) SpO2:  [99 %] 99 % (01/03 1748) Weight:  [73.2 kg] 73.2 kg (01/03 1751) GENERAL APPEARANCE:  Well appearing, well developed, well nourished, NAD HEENT:  Atraumatic, normocephalic, oropharynx clear NECK:  Supple without lymphadenopathy or thyromegaly ABDOMEN:  Soft, non-tender, no masses EXTREMITIES:  Moves all extremities well, without clubbing, cyanosis, or edema NEUROLOGIC:  Alert and oriented x 3, normal gait, CN II-XII grossly intact MENTAL STATUS:  appropriate BACK:  Non-tender to palpation, No CVAT SKIN:  Warm, dry, and intact  Laboratory Data:  Recent Labs  03/09/22 1802  HGB 13.9  HCT 41.0    Recent Labs    03/09/22 1802  NA 137  K 4.6  CL 102  GLUCOSE 85  BUN 20  CREATININE 2.00*     Results for orders placed or performed during the hospital encounter of 03/09/22 (from the past 24 hour(s))  I-STAT, chem 8     Status: Abnormal   Collection Time: 03/09/22  6:02 PM  Result Value Ref Range   Sodium 137 135 - 145 mmol/L   Potassium 4.6 3.5 - 5.1 mmol/L   Chloride 102 98 - 111 mmol/L   BUN 20 8 - 23 mg/dL   Creatinine, Ser 2.00 (H) 0.61 - 1.24 mg/dL    Glucose, Bld 85 70 - 99 mg/dL   Calcium, Ion 1.26 1.15 - 1.40 mmol/L   TCO2 23 22 - 32 mmol/L   Hemoglobin 13.9 13.0 - 17.0 g/dL   HCT 41.0 39.0 - 52.0 %   No results found for this or any previous visit (from the past 240 hour(s)).  Renal Function: Recent Labs    03/09/22 1802  CREATININE 2.00*   Estimated Creatinine Clearance: 31.4 mL/min (A) (by C-G formula based on SCr of 2 mg/dL (H)).   I independently reviewed the above imaging studies.  Impression/Recommendation Right ureteral calculus with obstruction Atrophic left kidney History of UTI's  I reviewed the patient's CT study from today showing a distal right ureteral calculus with obstruction.  Given solitary kidney, recommend urgent surgical management with cystoscopy, retrograde pyelogram, possible ureteroscopic stone manipulation, and insertion of ureteral stent.  Risks, benefits, alternatives were discussed with the patient in detail.  Potential risks including, but not limited to, infection; bleeding;  injury to urethra, bladder, or ureter; possible need of other treatments; possible failure to remove the calculus; ureteral  stricture formation; cardiac, pulmonary, cerebrovascular events; and anesthetic complications were discussed.  The patient understands and wishes to proceed.  Procedure: The patient will be scheduled for cystoscopy, right retrograde pyelogram, possible right ureteroscopy, possible laser lithotripsy, and insertion of right ureteral stent at [facility].  Surgical request is placed with the surgery schedulers and will be scheduled at the patient's/family request. Informed consent is given as documented below. Anesthesia: General  The patient does not have sleep apnea, history of MRSA, history of VRE, history of cardiac device requiring special anesthetic needs. Patient is stable and considered clear for surgical in an outpatient ambulatory surgery setting as well as patient hospital setting.  Consent  for Operation or Procedure: Provider Certification I hereby certify that the nature, purpose, benefits, usual and most frequent risks of, and alternatives to, the operation or procedure have been explained to the patient (or person authorized to sign for the patient) either by me as responsible physician or by the provider who is to perform the operation or procedure. Time spent such that the patient/family has had an opportunity to ask questions, and that those questions have been answered. The patient or the patient's representative has been advised that selected tasks may be performed by assistants to the primary health care provider(s). I believe that the patient (or person authorized to sign for the patient) understands what has been explained, and has consented to the operation or procedure. No guarantees were implied or made.   Michaelle Birks 03/09/2022, 6:11 PM

## 2022-03-09 NOTE — Op Note (Signed)
OPERATIVE NOTE   Patient Name: Jack Fuentes  MRN: [id number]   Date of Procedure: 03/09/22   Preoperative diagnosis:  Right ureteral calculus Right hydronephrosis Solitary right kidney  Postoperative diagnosis:  Right ureteral calculus (spontaneous passage into bladder) Right hydronephrosis Solitary right kidney  Procedure:  Cystoscopy  Right retrograde pyelogram Right ureteroscopy Insertion of right ureteral stent (69F x 26 cm, with tether) Intraoperative fluoroscopy in interpretation  Attending: Primus Bravo, MD  Anesthesia: General  Estimated blood loss: 5 ml  Fluids: Per anesthesia record  Drains: 69F x 26 cm right ureteral stent with tether  Specimens: stone fragments given to patient  Antibiotics: Zosyn IV  Findings:  scattered areas of mucosal erythema in bladder; 2 stones in bladder; no stones in right ureter; dilation of right ureter  Indications:  77 year old male presents for surgical management of a right distal ureteral calculus with associated obstruction and a solitary kidney.  He previously underwent placement of a right percutaneous nephrostomy tube due to complete obstruction of the right kidney due to proximal ureteral calculi.  He subsequently underwent right ureteroscopy with laser lithotripsy and insertion of a right ureteral stent in November 2023.  His stent was removed postoperatively.  He reports having severe dysuria, frequency, urgency, and suprapubic pain for several weeks.  He has had urine cultures positive for Serratia and has been treated with antibiotics.  Despite this therapy, he has continued to have symptoms.  He presented to the office today and underwent evaluation with a CT scan which showed right-sided hydroureteronephrosis and a 4-5 mm calculus near the right UVJ and severe left renal atrophy.  Creatinine has increased to 2.0.  He presents now for surgical management of the right ureteral calculus with obstruction and a  solitary kidney with cystoscopy, right retrograde pyelogram, possible ureteroscopy, possible stone manipulation, and insertion of right ureteral stent.  Risk and benefits of the procedure were discussed in detail.  He understands and wishes to proceed as described.  Description of Procedure:  Patient received IV Zosyn preoperatively.  After successful induction of a general anesthetic, the patient was placed in the dorsolithotomy position.  The patient's genital area was prepped and draped in sterile fashion.  Under direct visualization, a 70 French rigid cystoscope was passed through the urethra and into the bladder.  Inspection of the bladder demonstrated scattered areas of mucosal edema consistent with possible cystitis.  2 stone fragments were noted at the bladder base consistent with spontaneous passage of ureteral calculi.  No papillary lesions were seen throughout the bladder.  A right retrograde pyelogram was performed for evaluation of the patient's right collecting system.  Scout film showed a nonspecific bowel gas pattern and no obvious bony abnormalities.  Using a 5 [place] open-ended catheter, contrast was injected into the right ureter.  No obvious filling defect was noted in the distal ureter.  There was dilation of the ureter up to the renal pelvis which was also dilated.  No other filling defects were appreciated.  A sensor guidewire was placed under fluoroscopic guidance.  Ureteroscopy was performed alongside the guidewire.  The distal ureter was inspected and noted to be free of any stones.  Given the patient's hydronephrosis and solitary kidney, I elected to place a ureteral stent.  A 6 French by 26 cm double-J stent was passed over the guidewire and into the right renal pelvis.  Position was confirmed with fluoroscopy.  Following stent placement, the stone fragments were removed from the bladder using stent graspers.  The bladder was drained and the cystoscope was removed.  The tether was  left attached and brought through the meatus.  Intraurethral lidocaine jelly was placed.  The patient was then extubated and taken to the postanesthesia care unit in stable condition.  Complications: None  Condition: Stable, extubated, transferred to PACU  Plan:  Admit for observation Continue right ureteral stent Continue IV antibiotics (Zosyn) Labs in AM

## 2022-03-09 NOTE — Anesthesia Postprocedure Evaluation (Signed)
Anesthesia Post Note  Patient: Jack Fuentes  Procedure(s) Performed: CYSTOSCOPY WITH RETROGRADE PYELOGRAM, URETEROSCOPY AND STENT PLACEMENT (Right: Urethra)     Patient location during evaluation: PACU Anesthesia Type: General Level of consciousness: awake and alert, patient cooperative and oriented Pain management: pain level controlled Vital Signs Assessment: post-procedure vital signs reviewed and stable Respiratory status: spontaneous breathing, nonlabored ventilation and respiratory function stable Cardiovascular status: blood pressure returned to baseline and stable Postop Assessment: no apparent nausea or vomiting Anesthetic complications: no   No notable events documented.  Last Vitals:  Vitals:   03/09/22 1915 03/09/22 1930  BP: (!) 92/56 100/61  Pulse: 75 71  Resp: 19 10  Temp:    SpO2: 96% 98%    Last Pain:  Vitals:   03/09/22 1903  TempSrc:   PainSc: 0-No pain                 Doss Cybulski,E. Avion Kutzer

## 2022-03-09 NOTE — Transfer of Care (Signed)
Immediate Anesthesia Transfer of Care Note  Patient: Jack Fuentes  Procedure(s) Performed: CYSTOSCOPY WITH RETROGRADE PYELOGRAM, URETEROSCOPY AND STENT PLACEMENT (Right: Urethra)  Patient Location: PACU  Anesthesia Type:General  Level of Consciousness: awake, alert , oriented, and patient cooperative  Airway & Oxygen Therapy: Patient Spontanous Breathing and Patient connected to face mask oxygen  Post-op Assessment: Report given to RN and Post -op Vital signs reviewed and stable  Post vital signs: Reviewed and stable  Last Vitals:  Vitals Value Taken Time  BP 100/61 03/09/22 1850  Temp 36.3 C 03/09/22 1850  Pulse 79 03/09/22 1852  Resp 23 03/09/22 1852  SpO2 100 % 03/09/22 1852  Vitals shown include unvalidated device data.  Last Pain:  Vitals:   03/09/22 1751  TempSrc:   PainSc: 0-No pain         Complications: No notable events documented.

## 2022-03-10 ENCOUNTER — Encounter (HOSPITAL_COMMUNITY): Payer: Self-pay | Admitting: Urology

## 2022-03-10 DIAGNOSIS — N201 Calculus of ureter: Secondary | ICD-10-CM | POA: Diagnosis not present

## 2022-03-10 LAB — BASIC METABOLIC PANEL
Anion gap: 9 (ref 5–15)
BUN: 22 mg/dL (ref 8–23)
CO2: 21 mmol/L — ABNORMAL LOW (ref 22–32)
Calcium: 9 mg/dL (ref 8.9–10.3)
Chloride: 103 mmol/L (ref 98–111)
Creatinine, Ser: 2.14 mg/dL — ABNORMAL HIGH (ref 0.61–1.24)
GFR, Estimated: 32 mL/min — ABNORMAL LOW (ref >60)
Glucose, Bld: 151 mg/dL — ABNORMAL HIGH (ref 70–99)
Potassium: 4.6 mmol/L (ref 3.5–5.1)
Sodium: 133 mmol/L — ABNORMAL LOW (ref 135–145)

## 2022-03-10 MED ORDER — SULFAMETHOXAZOLE-TRIMETHOPRIM 800-160 MG PO TABS: 1.0000 | ORAL_TABLET | Freq: Two times a day (BID) | ORAL | 0 refills | Status: AC

## 2022-03-10 NOTE — Discharge Instructions (Signed)
DISCHARGE INSTRUCTIONS FOR KIDNEY STONE/URETERAL STENT   MEDICATIONS:  1.  Resume all your other meds from home - except do not take any extra narcotic pain meds that you may have at home.  2. Take bactrim as prescribed from clinic unless you hear otherwise from [facility]  ACTIVITY:  1. No strenuous activity x 1week  2. No driving while on narcotic pain medications  3. Drink plenty of water  4. Continue to walk at home - you can still get blood clots when you are at home, so keep active, but don't over do it.  5. May return to work/school tomorrow or when you feel ready   BATHING:  1. You can shower and we recommend daily showers  2. You have a string coming from your urethra: The stent string is attached to your ureteral stent. Do not pull on this.   SIGNS/SYMPTOMS TO CALL:  Please call us if you have a fever greater than 101.5, uncontrolled nausea/vomiting, uncontrolled pain, dizziness, unable to urinate, bloody urine, chest pain, shortness of breath, leg swelling, leg pain, redness around wound, drainage from wound, or any other concerns or questions.   You can reach [place] at [phone].   FOLLOW-UP:  1. You have an appointment in 2 weeks with a ultrasound of your kidney prior.  2. You have a string attached to your stent, you may remove it on Monday, Jan 8th. To do this, pull the strings until the stents are completely removed. You may feel an odd sensation in your back.

## 2022-03-10 NOTE — Progress Notes (Signed)
  Transition of Care [facility]) Screening Note   Patient Details  Name: Jack Fuentes Date of Birth: 08-18-1945   Transition of Care [facility]) CM/SW Contact:    Dessa Phi, RN Phone Number: 03/10/2022, 11:10 AM    Transition of Care Department [facility]) has reviewed patient and no TOC needs have been identified at this time. We will continue to monitor patient advancement through interdisciplinary progression rounds. If new patient transition needs arise, please place a TOC consult.

## 2022-03-10 NOTE — Progress Notes (Signed)
Patient given discharge instructions including all discharge Medications and schedules for these Medications. Patient verbalized understanding and discharge AVS with the Patient at time of discharge

## 2022-03-10 NOTE — Progress Notes (Signed)
Patient to be discharged to home today. IV Zoysn hung at 9:48 am and is a 4 hour infusion. RN confirmed with MD via phone that Patient needs to complete infusion and then maybe discharged.

## 2022-03-10 NOTE — Discharge Summary (Signed)
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) 06/29/2018    Procedures performed: Procedure(s): CYSTOSCOPY WITH RETROGRADE PYELOGRAM, URETEROSCOPY AND STENT PLACEMENT  History and Physical: For full details, please see admission history and physical. Briefly, Jack Fuentes is a 77 y.o. year old patient with recurrent UTIs and obstructing right distal ureteral stone in a solitary kidney.   Hospital Course: Patient tolerated the procedure well.  He was then transferred to the floor after an uneventful PACU stay.  His hospital course was uncomplicated.  On POD#`1 he had met discharge criteria: was eating a regular diet, was up and ambulating independently,  pain was well controlled, was voiding without a catheter, and was ready to for discharge.  PE on day of discharge: NAD Vitals:   03/09/22 2033 03/10/22 0020 03/10/22 0455 03/10/22 0839  BP:  (!) 93/49 (!) 97/54 (!) 99/57  Pulse:  63 70 89  Resp:  _0 Temp:  98.2 F (36.8 C) 98 F (36.7 C) 98.3 F (36.8 C)  TempSrc:  Oral Oral Oral  SpO2:  94%  96% 95%  Weight: 74.1 kg     Height: _1  (1.727 m)       Intake/Output Summary (Last 24 hours) at 03/10/2022 1024 Last data filed at 03/10/2022 0845 Gross per 24 hour  Intake 2028.75 ml  Output 550 ml  Net 1478.75 ml  Non-labored breathing Abdomen is soft Recent Labs    03/09/22 1802  HGB 13.9  HCT 41.0   Recent Labs    03/09/22 1802 03/10/22 0502  NA 137 133*  K 4.6 4.6  CL 102 103  CO2  --  21*  GLUCOSE 85 151*  BUN 20 22  CREATININE 2.00* 2.14*  CALCIUM  --  9.0   No results for input(s): "LABPT", "INR" in the last 72 hours. No results for input(s): "PSA" in the last 72 hours. No results for input(s): "LABURIN" in the last 72 hours. Results for orders placed or performed during the hospital encounter of 02/06/22  Urine Culture     Status: Abnormal   Collection Time: 02/06/22  7:02 PM   Specimen: Urine, Clean Catch  Result Value Ref Range Status   Specimen Description   Final    URINE, CLEAN CATCH Performed at [facility], [place] [address] [place], [place] [address]    Special Requests   Final    NONE Performed at [facility], [place] [address] [place], [place] [address]    Culture 30,000 COLONIES/mL SERRATIA MARCESCENS (A)  Final   Report Status 02/09/2022 FINAL  Final   Organism ID, Bacteria SERRATIA MARCESCENS (A)  Final      Susceptibility   Serratia marcescens - MIC*    CEFAZOLIN >=64 RESISTANT Resistant     CEFEPIME <=0.12 SENSITIVE Sensitive     CEFTRIAXONE <=0.25 SENSITIVE Sensitive     CIPROFLOXACIN <=0.25 SENSITIVE Sensitive     GENTAMICIN <=1 SENSITIVE Sensitive     NITROFURANTOIN 128 RESISTANT Resistant     TRIMETH/SULFA <=20 SENSITIVE Sensitive     * 30,000 COLONIES/mL SERRATIA MARCESCENS       Laboratory values:  Recent Labs    03/09/22 1802  HGB 13.9  HCT 41.0   Recent Labs    03/09/22 1802 03/10/22 0502  NA 137 133*  K 4.6 4.6  CL 102 103  CO2  --  21*  GLUCOSE 85 151*  BUN 20 22   CREATININE 2.00* 2.14*  CALCIUM  --  9.0   No results for input(s): "LABPT", "INR" in the last 72 hours. No results for input(s): "LABURIN" in the last 72 hours. Results for orders placed or performed during the hospital encounter of 02/06/22  Urine Culture     Status: Abnormal   Collection Time: 02/06/22  7:02 PM   Specimen: Urine, Clean Catch  Result Value Ref Range Status   Specimen Description   Final    URINE, CLEAN CATCH Performed at [facility], [place] [address] [place], [place] [address]    Special Requests   Final    NONE Performed at [facility], [place] [address], [place], [place] [address]    Culture 30,000 COLONIES/mL SERRATIA MARCESCENS (A)  Final   Report Status 02/09/2022 FINAL  Final   Organism ID, Bacteria SERRATIA MARCESCENS (A)  Final      Susceptibility   Serratia marcescens - MIC*    CEFAZOLIN >=64 RESISTANT Resistant     CEFEPIME <=0.12 SENSITIVE Sensitive     CEFTRIAXONE <=0.25 SENSITIVE Sensitive     CIPROFLOXACIN <=0.25 SENSITIVE Sensitive     GENTAMICIN <=1 SENSITIVE Sensitive     NITROFURANTOIN 128 RESISTANT Resistant     TRIMETH/SULFA <=20 SENSITIVE Sensitive     * 30,000 COLONIES/mL SERRATIA MARCESCENS    Disposition: Home  Discharge instruction: The patient was instructed to be ambulatory but told to refrain from heavy lifting, strenuous activity, or driving.   Discharge medications:  Allergies as of 03/10/2022       Reactions   Levofloxacin Swelling   Midazolam Swelling        Medication List     TAKE these medications    clopidogrel 75 MG tablet Commonly known as: PLAVIX Take 75 mg by mouth daily.   docusate sodium 100 MG capsule Commonly known as: Colace Take 1 capsule (100 mg total) by mouth daily as needed for up to 30 doses. What changed: reasons to take this   ferrous sulfate 325 (65 FE) MG tablet Take 1 tablet (325 mg total) by mouth every other day.   furosemide 40 MG  tablet Commonly known as: Lasix Take 1 tablet (40 mg total) by mouth daily.   gabapentin 300 MG capsule Commonly known as: NEURONTIN Take 1 capsule (300 mg total) by mouth 2 (two) times daily.   multivitamin with minerals Tabs tablet Take 1 tablet by mouth daily.   oxyCODONE-acetaminophen 5-325 MG tablet Commonly known as: Percocet Take 1 tablet by mouth every 4 (four) hours as needed for up to 18 doses for severe pain.   sulfamethoxazole-trimethoprim 800-160 MG tablet Commonly known as: BACTRIM DS Take 1  tablet by mouth 2 (two) times daily for 7 days.        Followup:   Follow-up Information     Janith Lima, MD Follow up on 03/22/2022.   Specialty: Urology Why: 12:45 Contact information: [address] [place] [address] [phone]

## 2023-10-25 ENCOUNTER — Encounter: Payer: Self-pay | Admitting: Emergency Medicine
